# Patient Record
Sex: Male | Born: 1955 | Race: White | Hispanic: No | Marital: Single | Smoking: Former smoker
Health system: Southern US, Community
[De-identification: ages and names within clinical notes are randomized; demographics above are authoritative.]

## PROBLEM LIST (undated history)

## (undated) DIAGNOSIS — C61 Malignant neoplasm of prostate: Secondary | ICD-10-CM

## (undated) DIAGNOSIS — H7013 Chronic mastoiditis, bilateral: Secondary | ICD-10-CM

## (undated) DIAGNOSIS — J449 Chronic obstructive pulmonary disease, unspecified: Secondary | ICD-10-CM

## (undated) DIAGNOSIS — I10 Essential (primary) hypertension: Secondary | ICD-10-CM

## (undated) DIAGNOSIS — M4800 Spinal stenosis, site unspecified: Secondary | ICD-10-CM

## (undated) DIAGNOSIS — C3490 Malignant neoplasm of unspecified part of unspecified bronchus or lung: Secondary | ICD-10-CM

## (undated) DIAGNOSIS — J939 Pneumothorax, unspecified: Secondary | ICD-10-CM

## (undated) DIAGNOSIS — IMO0001 Reserved for inherently not codable concepts without codable children: Secondary | ICD-10-CM

## (undated) DIAGNOSIS — H9190 Unspecified hearing loss, unspecified ear: Secondary | ICD-10-CM

## (undated) HISTORY — PX: STAPEDES SURGERY: SHX789

## (undated) HISTORY — PX: PROSTATE BIOPSY: SHX241

## (undated) HISTORY — PX: LEG SURGERY: SHX1003

## (undated) HISTORY — PX: MOUTH SURGERY: SHX715

---

## 2005-11-09 HISTORY — PX: COLONOSCOPY: SHX174

## 2006-07-20 ENCOUNTER — Ambulatory Visit (HOSPITAL_COMMUNITY): Admission: EM | Admit: 2006-07-20 | Discharge: 2006-07-20 | Payer: Self-pay | Admitting: Emergency Medicine

## 2008-09-19 ENCOUNTER — Emergency Department (HOSPITAL_COMMUNITY): Admission: EM | Admit: 2008-09-19 | Discharge: 2008-09-19 | Payer: Self-pay | Admitting: Emergency Medicine

## 2009-08-02 ENCOUNTER — Encounter: Admission: RE | Admit: 2009-08-02 | Discharge: 2009-08-02 | Payer: Self-pay | Admitting: Otolaryngology

## 2009-08-12 ENCOUNTER — Encounter (INDEPENDENT_AMBULATORY_CARE_PROVIDER_SITE_OTHER): Payer: Self-pay | Admitting: Otolaryngology

## 2009-08-12 ENCOUNTER — Ambulatory Visit (HOSPITAL_BASED_OUTPATIENT_CLINIC_OR_DEPARTMENT_OTHER): Admission: RE | Admit: 2009-08-12 | Discharge: 2009-08-12 | Payer: Self-pay | Admitting: Otolaryngology

## 2009-12-30 ENCOUNTER — Ambulatory Visit (HOSPITAL_BASED_OUTPATIENT_CLINIC_OR_DEPARTMENT_OTHER): Admission: RE | Admit: 2009-12-30 | Discharge: 2009-12-30 | Payer: Self-pay | Admitting: Otolaryngology

## 2010-07-30 ENCOUNTER — Encounter: Admission: RE | Admit: 2010-07-30 | Discharge: 2010-08-28 | Payer: Self-pay | Admitting: Orthopedic Surgery

## 2011-01-28 LAB — COMPREHENSIVE METABOLIC PANEL
Alkaline Phosphatase: 73 U/L (ref 39–117)
BUN: 8 mg/dL (ref 6–23)
CO2: 28 mEq/L (ref 19–32)
Chloride: 104 mEq/L (ref 96–112)
Potassium: 3.5 mEq/L (ref 3.5–5.1)
Sodium: 138 mEq/L (ref 135–145)

## 2011-02-12 LAB — POCT HEMOGLOBIN-HEMACUE: Hemoglobin: 16.7 g/dL (ref 13.0–17.0)

## 2011-02-12 LAB — POCT I-STAT, CHEM 8
Calcium, Ion: 1.16 mmol/L (ref 1.12–1.32)
Glucose, Bld: 92 mg/dL (ref 70–99)
HCT: 46 % (ref 39.0–52.0)
Sodium: 141 mEq/L (ref 135–145)
TCO2: 30 mmol/L (ref 0–100)

## 2011-03-27 NOTE — Op Note (Signed)
NAME:  DENISE, BRAMBLETT NO.:  [id number]   MEDICAL RECORD NO.:  [id number]          PATIENT TYPE:  INP   LOCATION:  1832                         FACILITY:  [facility]   PHYSICIAN:  Burnard Bunting, M.D.    DATE OF BIRTH:  1956/04/29   DATE OF PROCEDURE:  07/20/2006  DATE OF DISCHARGE:                                 OPERATIVE REPORT   PREOPERATIVE DIAGNOSES:  Right leg complex laceration, with foreign bodies  and partial tendon laceration.   POSTOPERATIVE DIAGNOSES:  Right leg complex laceration, with foreign bodies  and partial tendon laceration.   PROCEDURES:  Right leg closure of complex laceration, with debridement of  tendon, skin, subcutaneous tissue, removal of foreign bodies.   SURGEON:  Burnard Bunting, M.D.   ASSISTANT:  None.   ANESTHESIA:  General endotracheal.   ESTIMATED BLOOD LOSS:  Minimal.   INDICATIONS:  Corday Wyka is a 55 year old patient, who had a piece of tile  cut his right leg.  He presents now for operative management.  The patient  was noted to have pulsatile bleeding in the ER on exam.   PROCEDURE IN DETAIL:  The patient was brought to the operating room, where  general endotracheal anesthesia was induced.  Preoperative antibiotics were  administered.  The right leg was prepped with Hibiclens and saline and  draped in a sterile manner.  The patient had about a 15-cm laceration on the  anterior compartment of his right leg.  His anterior tibial artery and  neurovascular bundle were intact.  There were foreign bodies within the  incision.  The anterior tib tendon was partially lacerated, and this was  debrided.  Using a curet, the skin, subcutaneous tissue, fascia and muscle  were debrided.  Foreign bodies  x3 were removed.  Copious irrigation was  then performed in a nonpulsatile fashion.  The incision was then  reapproximated loosely using a 3-0 nylon suture.  The patient was placed in  a bulky dressing.  He tolerated the procedure well  without immediate  complications.      Burnard Bunting, M.D.     GSD/MEDQ  D:  07/20/2006  T:  07/20/2006  Job:  [id number]

## 2011-03-27 NOTE — Consult Note (Signed)
NAME:  Kyle Hamilton, BELLANCA NO.:  [id number]   MEDICAL RECORD NO.:  [id number]          PATIENT TYPE:  INP   LOCATION:  2550                         FACILITY:  [facility]   PHYSICIAN:  Burnard Bunting, M.D.    DATE OF BIRTH:  08-29-1956   DATE OF CONSULTATION:  07/20/2006  DATE OF DISCHARGE:                                   CONSULTATION   CONSULT REQUESTED BY:  Dr. Weldon Inches.   CHIEF COMPLAINT:  Right leg pain.   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  He  reports significant bleeding which is not controllable in the ER.  He was  seen in urgent care and referred here.  He has not been able to bear weight  on the leg.  He denies any numbness or tingling in his foot.   PAST MEDICAL HISTORY:  Noncontributory.   PAST SURGICAL HISTORY:  None.   MEDICATIONS:  None.   ALLERGIES:  NONE.   REVIEW OF SYSTEMS:  Fourteen are reviewed and they are noncontributory.  Has  no history of DVT.  He has smoked a pack per day for 30 years.  He lives in  [place].  He has family in the area.  No recent chest pain or shortness  of breath.   PHYSICAL EXAMINATION:  VITAL SIGNS:  Blood pressure 135/100, heart rate 53,  respirations 12, 98% pulse ox on room air.  CHEST:  Clear to auscultation.  HEART:  Regular rate and rhythm.  ABDOMEN:  Benign.  EXTREMITIES:  The right lower extremity has DP/PT 2+/4.  Dorsalis and  plantar flexion is intact.  Sensation is intact on dorsoplantar aspect of  the foot.  Compartments are soft.  The range of motion is full.  He has a  longitudinal 15-cm laceration on the leg with arterial pulsatile bleeding  noted.  The laceration does get to the anterior compartment.   Radiographs are pending but by report negative.   IMPRESSION:  Complex right leg laceration with arterial bleeding.   PLAN:  I&D with delayed primary closure.  Risks and benefits were  discussed  with the patient.  He will likely be out of work approximately a week until  his return office visit.      Burnard Bunting, M.D.     GSD/MEDQ  D:  07/20/2006  T:  07/21/2006  Job:  [id number]

## 2011-08-11 LAB — URINALYSIS, ROUTINE W REFLEX MICROSCOPIC
Ketones, ur: 15 — AB
Nitrite: NEGATIVE
Specific Gravity, Urine: 1.031 — ABNORMAL HIGH
Urobilinogen, UA: 1
pH: 6.5

## 2011-08-11 LAB — POCT I-STAT, CHEM 8
Calcium, Ion: 1.05 — ABNORMAL LOW
Chloride: 99
Creatinine, Ser: 0.8
HCT: 50
Potassium: 3.1 — ABNORMAL LOW
Sodium: 138

## 2011-08-11 LAB — DIFFERENTIAL
Basophils Absolute: 0.1
Eosinophils Relative: 0
Lymphs Abs: 1
Monocytes Relative: 7
Neutro Abs: 12.6 — ABNORMAL HIGH

## 2011-08-11 LAB — CBC
HCT: 48.4
Hemoglobin: 16.3
Platelets: 259
RDW: 14.2
WBC: 14.6 — ABNORMAL HIGH

## 2011-08-11 LAB — HEPATIC FUNCTION PANEL
Albumin: 3.9
Alkaline Phosphatase: 80
Bilirubin, Direct: 0.2

## 2011-12-25 DIAGNOSIS — Z125 Encounter for screening for malignant neoplasm of prostate: Secondary | ICD-10-CM | POA: Diagnosis not present

## 2011-12-25 DIAGNOSIS — M5137 Other intervertebral disc degeneration, lumbosacral region: Secondary | ICD-10-CM | POA: Diagnosis not present

## 2011-12-25 DIAGNOSIS — G894 Chronic pain syndrome: Secondary | ICD-10-CM | POA: Diagnosis not present

## 2011-12-25 DIAGNOSIS — I10 Essential (primary) hypertension: Secondary | ICD-10-CM | POA: Diagnosis not present

## 2012-02-15 DIAGNOSIS — H7010 Chronic mastoiditis, unspecified ear: Secondary | ICD-10-CM | POA: Diagnosis not present

## 2012-02-23 DIAGNOSIS — F17200 Nicotine dependence, unspecified, uncomplicated: Secondary | ICD-10-CM | POA: Diagnosis not present

## 2012-02-23 DIAGNOSIS — J449 Chronic obstructive pulmonary disease, unspecified: Secondary | ICD-10-CM | POA: Diagnosis not present

## 2012-02-23 DIAGNOSIS — E785 Hyperlipidemia, unspecified: Secondary | ICD-10-CM | POA: Diagnosis not present

## 2012-02-23 DIAGNOSIS — I10 Essential (primary) hypertension: Secondary | ICD-10-CM | POA: Diagnosis not present

## 2012-05-31 DIAGNOSIS — H902 Conductive hearing loss, unspecified: Secondary | ICD-10-CM | POA: Diagnosis not present

## 2012-05-31 DIAGNOSIS — H7010 Chronic mastoiditis, unspecified ear: Secondary | ICD-10-CM | POA: Diagnosis not present

## 2012-07-21 NOTE — H&P (Signed)
  Assessment  CHRONIC MASTOIDITIS_ (383.1). 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 Orders  Audiological Evaluation; Comprehensive Audiometry; Requested for: 31 May 2012. Discussed  He has some vertigo recently which is slowly getting better and he feels that his hearing is getting worse. Both mastoid cavities looked healthy and dry with some dry cerumen buildup bilaterally. The left side was cleaned out. The right side was too tender to clean.   Audiogram reveals a severe mixed hearing loss.    Recommend he consider hearing amplification, which in his case, may best be provided with BAHA. We will have him return to discuss this in more detail with our audiologists. Reason For Visit  Dizziness and loss of hearing. Allergies  No Known Drug Allergies. Current Meds  Hydrochlorothiazide 25 MG Oral Tablet;; RPT ProAir HFA 108 (90 Base) MCG/ACT Inhalation Aerosol Solution;; RPT. 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 PSH  Ear Surgery. Personal Hx  Current Smoker (305.1) Marital History - Single Never Drank Alcohol. Signature  Electronically signed by : Serena Colonel  M.D.; 05/31/2012 11:20 PM EST.

## 2012-07-27 ENCOUNTER — Encounter (HOSPITAL_COMMUNITY): Payer: Self-pay | Admitting: Pharmacy Technician

## 2012-07-28 ENCOUNTER — Encounter (HOSPITAL_COMMUNITY): Payer: Self-pay

## 2012-07-28 ENCOUNTER — Encounter (HOSPITAL_COMMUNITY)
Admission: RE | Admit: 2012-07-28 | Discharge: 2012-07-28 | Disposition: A | Discharge status: Home / Self Care | Payer: Medicare Other | Source: Ambulatory Visit | Attending: Otolaryngology | Admitting: Otolaryngology

## 2012-07-28 DIAGNOSIS — F17200 Nicotine dependence, unspecified, uncomplicated: Secondary | ICD-10-CM | POA: Diagnosis not present

## 2012-07-28 DIAGNOSIS — Z01811 Encounter for preprocedural respiratory examination: Secondary | ICD-10-CM | POA: Diagnosis not present

## 2012-07-28 DIAGNOSIS — I10 Essential (primary) hypertension: Secondary | ICD-10-CM | POA: Diagnosis not present

## 2012-07-28 DIAGNOSIS — J438 Other emphysema: Secondary | ICD-10-CM | POA: Diagnosis not present

## 2012-07-28 DIAGNOSIS — H900 Conductive hearing loss, bilateral: Secondary | ICD-10-CM | POA: Diagnosis not present

## 2012-07-28 DIAGNOSIS — Z01818 Encounter for other preprocedural examination: Secondary | ICD-10-CM | POA: Diagnosis not present

## 2012-07-28 DIAGNOSIS — Z01810 Encounter for preprocedural cardiovascular examination: Secondary | ICD-10-CM | POA: Diagnosis not present

## 2012-07-28 DIAGNOSIS — Z01812 Encounter for preprocedural laboratory examination: Secondary | ICD-10-CM | POA: Diagnosis not present

## 2012-07-28 HISTORY — DX: Essential (primary) hypertension: I10

## 2012-07-28 HISTORY — DX: Unspecified hearing loss, unspecified ear: H91.90

## 2012-07-28 LAB — CBC
HCT: 46.8 % (ref 39.0–52.0)
Hemoglobin: 15.7 g/dL (ref 13.0–17.0)
MCH: 29.8 pg (ref 26.0–34.0)
MCHC: 33.5 g/dL (ref 30.0–36.0)
MCV: 88.8 fL (ref 78.0–100.0)

## 2012-07-28 LAB — BASIC METABOLIC PANEL
BUN: 8 mg/dL (ref 6–23)
CO2: 31 mEq/L (ref 19–32)
GFR calc non Af Amer: >90 mL/min (ref >90)
Glucose, Bld: 95 mg/dL (ref 70–99)
Potassium: 3.4 mEq/L — ABNORMAL LOW (ref 3.5–5.1)

## 2012-07-28 LAB — SURGICAL PCR SCREEN
MRSA, PCR: NEGATIVE
Staphylococcus aureus: NEGATIVE

## 2012-07-28 NOTE — Pre-Procedure Instructions (Signed)
20 Kyle Hamilton  07/28/2012   Your procedure is scheduled on: 08-03-2012  Report to [facility] at 10:00 AM.  Call this number if you have problems the morning of surgery: [phone]   Remember:   Do not eat food or drink:After Midnight  Tuesday.      Take these medicines the morning of surgery with A SIP OF WATER: none   Do not wear jewelry  Do not wear lotions, powders, or perfumes. You may wear deodorant.  Do not shave 48 hours prior to surgery. Men may shave face and neck.  Do not bring valuables to the hospital.  Contacts, dentures or bridgework may not be worn into surgery.  Leave suitcase in the car. After surgery it may be brought to your room.  For patients admitted to the hospital, checkout time is 11:00 AM the day of discharge.   Patients discharged the day of surgery will not be allowed to drive home.    Name and phone number of your driver: __________________    Special Instructions: CHG Shower Shower 2 days before surgery and 1 day before surgery with Hibiclens.   Please read over the following fact sheets that you were given: Pain Booklet, MRSA Information and Surgical Site Infection Prevention

## 2012-08-01 DIAGNOSIS — H906 Mixed conductive and sensorineural hearing loss, bilateral: Secondary | ICD-10-CM | POA: Diagnosis not present

## 2012-08-02 MED ORDER — CEFAZOLIN SODIUM-DEXTROSE 2-3 GM-% IV SOLR
2.0000 g | INTRAVENOUS | Status: AC
  Administered 2012-08-03: 2 g via INTRAVENOUS
  Filled 2012-08-02: qty 50

## 2012-08-03 ENCOUNTER — Encounter (HOSPITAL_COMMUNITY): Payer: Self-pay | Admitting: Critical Care Medicine

## 2012-08-03 ENCOUNTER — Ambulatory Visit (HOSPITAL_COMMUNITY): Payer: Medicare Other | Admitting: Critical Care Medicine

## 2012-08-03 ENCOUNTER — Encounter (HOSPITAL_COMMUNITY): Payer: Self-pay | Admitting: *Deleted

## 2012-08-03 ENCOUNTER — Encounter (HOSPITAL_COMMUNITY)
Admission: RE | Disposition: A | Discharge status: Home / Self Care | Payer: Self-pay | Source: Ambulatory Visit | Attending: Otolaryngology

## 2012-08-03 ENCOUNTER — Ambulatory Visit (HOSPITAL_COMMUNITY)
Admission: RE | Admit: 2012-08-03 | Discharge: 2012-08-03 | Disposition: A | Discharge status: Home / Self Care | Payer: Medicare Other | Source: Ambulatory Visit | Attending: Otolaryngology | Admitting: Otolaryngology

## 2012-08-03 DIAGNOSIS — H900 Conductive hearing loss, bilateral: Secondary | ICD-10-CM | POA: Diagnosis not present

## 2012-08-03 DIAGNOSIS — Z01818 Encounter for other preprocedural examination: Secondary | ICD-10-CM | POA: Insufficient documentation

## 2012-08-03 DIAGNOSIS — F17200 Nicotine dependence, unspecified, uncomplicated: Secondary | ICD-10-CM | POA: Insufficient documentation

## 2012-08-03 DIAGNOSIS — Z01810 Encounter for preprocedural cardiovascular examination: Secondary | ICD-10-CM | POA: Insufficient documentation

## 2012-08-03 DIAGNOSIS — Z01812 Encounter for preprocedural laboratory examination: Secondary | ICD-10-CM | POA: Insufficient documentation

## 2012-08-03 DIAGNOSIS — I10 Essential (primary) hypertension: Secondary | ICD-10-CM | POA: Diagnosis not present

## 2012-08-03 DIAGNOSIS — H902 Conductive hearing loss, unspecified: Secondary | ICD-10-CM | POA: Diagnosis not present

## 2012-08-03 HISTORY — PX: BONE ANCHORED HEARING AID IMPLANT: SHX5193

## 2012-08-03 SURGERY — INSERTION, BONE ANCHORED HEARING AID
Anesthesia: General | Site: Ear | Laterality: Right | Wound class: Clean

## 2012-08-03 MED ORDER — EPHEDRINE SULFATE 50 MG/ML IJ SOLN
INTRAMUSCULAR | Status: DC | PRN
  Administered 2012-08-03: 10 mg via INTRAVENOUS
  Administered 2012-08-03: 5 mg via INTRAVENOUS

## 2012-08-03 MED ORDER — 0.9 % SODIUM CHLORIDE (POUR BTL) OPTIME
TOPICAL | Status: DC | PRN
  Administered 2012-08-03: 1000 mL

## 2012-08-03 MED ORDER — ROCURONIUM BROMIDE 100 MG/10ML IV SOLN
INTRAVENOUS | Status: DC | PRN
  Administered 2012-08-03: 50 mg via INTRAVENOUS

## 2012-08-03 MED ORDER — MIDAZOLAM HCL 5 MG/5ML IJ SOLN
INTRAMUSCULAR | Status: DC | PRN
  Administered 2012-08-03: 2 mg via INTRAVENOUS

## 2012-08-03 MED ORDER — LACTATED RINGERS IV SOLN
INTRAVENOUS | Status: DC
  Administered 2012-08-03: 11:00:00 via INTRAVENOUS

## 2012-08-03 MED ORDER — ONDANSETRON HCL 4 MG/2ML IJ SOLN
INTRAMUSCULAR | Status: DC | PRN
  Administered 2012-08-03: 4 mg via INTRAVENOUS

## 2012-08-03 MED ORDER — PROPOFOL 10 MG/ML IV BOLUS
INTRAVENOUS | Status: DC | PRN
  Administered 2012-08-03: 100 mg via INTRAVENOUS

## 2012-08-03 MED ORDER — LIDOCAINE HCL (CARDIAC) 20 MG/ML IV SOLN
INTRAVENOUS | Status: DC | PRN
  Administered 2012-08-03: 100 mg via INTRAVENOUS

## 2012-08-03 MED ORDER — LIDOCAINE-EPINEPHRINE 1 %-1:100000 IJ SOLN
INTRAMUSCULAR | Status: AC
  Filled 2012-08-03: qty 1

## 2012-08-03 MED ORDER — LIDOCAINE-EPINEPHRINE 1 %-1:100000 IJ SOLN
INTRAMUSCULAR | Status: DC | PRN
  Administered 2012-08-03: 1 mL

## 2012-08-03 MED ORDER — ARTIFICIAL TEARS OP OINT
TOPICAL_OINTMENT | OPHTHALMIC | Status: DC | PRN
  Administered 2012-08-03: 1 via OPHTHALMIC

## 2012-08-03 MED ORDER — LACTATED RINGERS IV SOLN
INTRAVENOUS | Status: DC | PRN
  Administered 2012-08-03 (×2): via INTRAVENOUS

## 2012-08-03 MED ORDER — PROMETHAZINE HCL 25 MG RE SUPP: 25.0000 mg | Freq: Four times a day (QID) | RECTAL | Status: DC | PRN

## 2012-08-03 MED ORDER — HYDROCODONE-ACETAMINOPHEN 7.5-500 MG PO TABS: 1.0000 | ORAL_TABLET | Freq: Four times a day (QID) | ORAL | Status: DC | PRN

## 2012-08-03 MED ORDER — BACITRACIN ZINC 500 UNIT/GM EX OINT
TOPICAL_OINTMENT | CUTANEOUS | Status: AC
  Filled 2012-08-03: qty 15

## 2012-08-03 MED ORDER — HYDROMORPHONE HCL PF 1 MG/ML IJ SOLN: 0.2500 mg | INTRAMUSCULAR | Status: DC | PRN

## 2012-08-03 MED ORDER — MINERAL OIL LIGHT 100 % EX OIL
TOPICAL_OIL | CUTANEOUS | Status: AC
  Filled 2012-08-03: qty 25

## 2012-08-03 MED ORDER — GLYCOPYRROLATE 0.2 MG/ML IJ SOLN
INTRAMUSCULAR | Status: DC | PRN
  Administered 2012-08-03: 0.6 mg via INTRAVENOUS
  Administered 2012-08-03: 0.1 mg via INTRAVENOUS

## 2012-08-03 MED ORDER — CEPHALEXIN 500 MG PO CAPS: 500.0000 mg | ORAL_CAPSULE | Freq: Three times a day (TID) | ORAL | Status: DC

## 2012-08-03 MED ORDER — DEXAMETHASONE SODIUM PHOSPHATE 4 MG/ML IJ SOLN
INTRAMUSCULAR | Status: DC | PRN
  Administered 2012-08-03: 4 mg via INTRAVENOUS

## 2012-08-03 MED ORDER — NEOSTIGMINE METHYLSULFATE 1 MG/ML IJ SOLN
INTRAMUSCULAR | Status: DC | PRN
  Administered 2012-08-03: 4 mg via INTRAVENOUS

## 2012-08-03 MED ORDER — FENTANYL CITRATE 0.05 MG/ML IJ SOLN
INTRAMUSCULAR | Status: DC | PRN
  Administered 2012-08-03: 150 ug via INTRAVENOUS

## 2012-08-03 SURGICAL SUPPLY — 41 items
ALLEVYN NON-ADHESIVE DRESSING ×1 IMPLANT
BIA400 Implant 4mm (Otologic Implant) ×1 IMPLANT
BIOPSY PUNCH ×1 IMPLANT
BLADE SURG 15 STRL LF DISP TIS (BLADE) ×1 IMPLANT
BLADE SURG 15 STRL SS (BLADE) ×2
BLADE SURG ROTATE 9660 (MISCELLANEOUS) ×2 IMPLANT
CANISTER SUCTION 2500CC (MISCELLANEOUS) ×2 IMPLANT
CLEANER TIP ELECTROSURG 2X2 (MISCELLANEOUS) ×2 IMPLANT
CLOTH BEACON ORANGE TIMEOUT ST (SAFETY) ×2 IMPLANT
CORDS BIPOLAR (ELECTRODE) ×2 IMPLANT
COVER SURGICAL LIGHT HANDLE (MISCELLANEOUS) ×2 IMPLANT
CRADLE DONUT ADULT HEAD (MISCELLANEOUS) ×1 IMPLANT
DECANTER SPIKE VIAL GLASS SM (MISCELLANEOUS) ×2 IMPLANT
DRAPE INCISE 23X17 IOBAN STRL (DRAPES) ×1
DRAPE INCISE 23X17 STRL (DRAPES) ×1 IMPLANT
DRAPE INCISE IOBAN 23X17 STRL (DRAPES) ×1 IMPLANT
DRAPE MICROSCOPE LEICA 46X105 (MISCELLANEOUS) IMPLANT
DRAPE MICROSCOPE LEICA 54X105 (DRAPE) ×1 IMPLANT
ELECT COATED BLADE 2.86 ST (ELECTRODE) ×2 IMPLANT
ELECT REM PT RETURN 9FT ADLT (ELECTROSURGICAL) ×2
ELECTRODE REM PT RTRN 9FT ADLT (ELECTROSURGICAL) ×1 IMPLANT
GAUZE SPONGE 4X4 16PLY XRAY LF (GAUZE/BANDAGES/DRESSINGS) ×8 IMPLANT
GLOVE BIO SURGEON STRL SZ7.5 (GLOVE) ×3 IMPLANT
GOWN STRL NON-REIN LRG LVL3 (GOWN DISPOSABLE) ×5 IMPLANT
GUIDE DRILL 3+4MM ×1 IMPLANT
HEALING CAP WITH PLUG 30MM ×1 IMPLANT
KIT BASIN OR (CUSTOM PROCEDURE TRAY) ×2 IMPLANT
KIT ROOM TURNOVER OR (KITS) ×2 IMPLANT
NS IRRIG 1000ML POUR BTL (IV SOLUTION) ×2 IMPLANT
PAD ARMBOARD 7.5X6 YLW CONV (MISCELLANEOUS) ×3 IMPLANT
PENCIL BUTTON HOLSTER BLD 10FT (ELECTRODE) ×1 IMPLANT
SUT ETHILON 4 0 PS 2 18 (SUTURE) ×4 IMPLANT
SUT VIC AB 4-0 P-3 18X BRD (SUTURE) ×3 IMPLANT
SUT VIC AB 4-0 P3 18 (SUTURE) ×2
SYR BULB 3OZ (MISCELLANEOUS) ×2 IMPLANT
TOWEL OR 17X24 6PK STRL BLUE (TOWEL DISPOSABLE) ×2 IMPLANT
TOWEL OR 17X26 10 PK STRL BLUE (TOWEL DISPOSABLE) ×2 IMPLANT
TRAY ENT MC OR (CUSTOM PROCEDURE TRAY) ×2 IMPLANT
WATER STERILE IRR 1000ML POUR (IV SOLUTION) ×1 IMPLANT
WIDENING DRILL 4MM ×1 IMPLANT
WIPE INSTRUMENT VISIWIPE 73X73 (MISCELLANEOUS) ×2 IMPLANT

## 2012-08-03 NOTE — Op Note (Signed)
OPERATIVE REPORT  DATE OF SURGERY: 08/03/2012  PATIENT:  Kyle Hamilton,  56 y.o. male  PRE-OPERATIVE DIAGNOSIS:  Conductive hearing loss   POST-OPERATIVE DIAGNOSIS:  Conductive hearing loss   PROCEDURE:  Procedure(s): BONE ANCHORED HEARING AID (BAHA) IMPLANT  SURGEON:  Susy Frizzle, MD  ASSISTANTS: none   ANESTHESIA:   general  EBL:  5 ml  DRAINS: none   LOCAL MEDICATIONS USED:  OTHER XYLOCAINE WITH Epinephrine  SPECIMEN:  No Specimen  COUNTS:  YES  PROCEDURE DETAILS: Patient was taken to the operating room and placed on the operating table in the supine position. Following induction of general endotracheal anesthesia, the hairline was shaved on the right side, posterior to the pinna. The implant site was marked with a marking pen, an incision was outlined superior and inferior to this. The skin was then prepped and draped in a standard fashion. Xylocaine with epinephrine was infiltrated all the way down to the skull. A 5 mm dermal punch was used to create a circular opening in the proposed implant site all the way down to the periosteum. Incision was then elongated superiorly and inferiorly with 15 scalpel. Exposure of the skull was obtained. A 15 scalpel was used to incise the periosteum and a cruciate fashion. Periosteal elevator was used to expose the bone. The cochlear Baha system was used. Prior to the marking and injection, a needle was used to measure the depth of soft tissue at 6 mm. A 10 mm implant was then used. The pilot hole was created. The hole widening was then performed. When the original abutment was placed, unfortunately it did not lock into place. A new site 1 cm superior was then used and the periosteum was incised and the skull was exposed. A pilot hole was created, followed by the widening hole . The implant was placed without difficulty and was locked and secured in place with good seating. The wound was irrigated with saline. 3-0 Vicryl suture was used to  reapproximate the skin and soft tissue. A dressing was applied with bacitracin ointment and the healing cap was applied. The patient was then awakened extubated and transferred to PACU in stable condition.  PATIENT DISPOSITION:  PACU - hemodynamically stable.

## 2012-08-03 NOTE — Anesthesia Procedure Notes (Signed)
Procedure Name: Intubation Date/Time: 08/03/2012 12:11 PM Performed by: Elon Alas Pre-anesthesia Checklist: Patient identified, Timeout performed, Emergency Drugs available, Suction available and Patient being monitored Patient Re-evaluated:Patient Re-evaluated prior to inductionOxygen Delivery Method: Circle system utilized Preoxygenation: Pre-oxygenation with 100% oxygen Intubation Type: IV induction Ventilation: Mask ventilation without difficulty and Oral airway inserted - appropriate to patient size Laryngoscope Size: Mac and 4 Grade View: Grade I Tube type: Oral Tube size: 7.5 mm Number of attempts: 1 Airway Equipment and Method: Stylet Placement Confirmation: positive ETCO2,  ETT inserted through vocal cords under direct vision and breath sounds checked- equal and bilateral Secured at: 23 cm Tube secured with: Tape Dental Injury: Teeth and Oropharynx as per pre-operative assessment  Comments: DLx1 by EMT student

## 2012-08-03 NOTE — Preoperative (Signed)
Beta Blockers   Reason not to administer Beta Blockers:Not Applicable 

## 2012-08-03 NOTE — Anesthesia Preprocedure Evaluation (Addendum)
Anesthesia Evaluation  Patient identified by MRN, date of birth, ID band Patient awake    Reviewed: Allergy & Precautions, H&P , NPO status , Patient's Chart, lab work & pertinent test results  Airway Mallampati: II TM Distance: >3 FB Neck ROM: Full    Dental  (+) Dental Advisory Given, Lower Dentures and Upper Dentures   Pulmonary Current Smoker,  breath sounds clear to auscultation        Cardiovascular hypertension, Pt. on medications Rhythm:Regular Rate:Normal     Neuro/Psych    GI/Hepatic negative GI ROS, Neg liver ROS,   Endo/Other  negative endocrine ROS  Renal/GU negative Renal ROS     Musculoskeletal   Abdominal   Peds  Hematology negative hematology ROS (+)   Anesthesia Other Findings   Reproductive/Obstetrics                         Anesthesia Physical Anesthesia Plan  ASA: II  Anesthesia Plan: General   Post-op Pain Management:    Induction: Intravenous  Airway Management Planned: Oral ETT  Additional Equipment:   Intra-op Plan:   Post-operative Plan: Extubation in OR  Informed Consent: I have reviewed the patients History and Physical, chart, labs and discussed the procedure including the risks, benefits and alternatives for the proposed anesthesia with the patient or authorized representative who has indicated his/her understanding and acceptance.   Dental advisory given  Plan Discussed with: Anesthesiologist, Surgeon and CRNA  Anesthesia Plan Comments:        Anesthesia Quick Evaluation

## 2012-08-03 NOTE — Transfer of Care (Signed)
Immediate Anesthesia Transfer of Care Note  Patient: Kyle Hamilton  Procedure(s) Performed: Procedure(s) (LRB) with comments: BONE ANCHORED HEARING AID (BAHA) IMPLANT (Right)  Patient Location: PACU  Anesthesia Type: General  Level of Consciousness: awake, alert  and oriented  Airway & Oxygen Therapy: Patient Spontanous Breathing and Patient connected to nasal cannula oxygen  Post-op Assessment: Report given to PACU RN, Post -op Vital signs reviewed and stable and Patient moving all extremities X 4  Post vital signs: Reviewed and stable  Complications: No apparent anesthesia complications

## 2012-08-03 NOTE — Transfer of Care (Signed)
Immediate Anesthesia Transfer of Care Note  Patient: Kyle Hamilton  Procedure(s) Performed: Procedure(s) (LRB) with comments: BONE ANCHORED HEARING AID (BAHA) IMPLANT (Right)  Patient Location: PACU  Anesthesia Type: General  Level of Consciousness: awake  Airway & Oxygen Therapy: Patient Spontanous Breathing  Post-op Assessment: Report given to PACU RN  Post vital signs: Reviewed  Complications: No apparent anesthesia complications

## 2012-08-03 NOTE — Anesthesia Postprocedure Evaluation (Signed)
  Anesthesia Post-op Note  Patient: Kyle Hamilton  Procedure(s) Performed: Procedure(s) (LRB) with comments: BONE ANCHORED HEARING AID (BAHA) IMPLANT (Right)  Patient Location: PACU  Anesthesia Type: General  Level of Consciousness: awake  Airway and Oxygen Therapy: Patient Spontanous Breathing  Post-op Pain: mild  Post-op Assessment: Post-op Vital signs reviewed  Post-op Vital Signs: Reviewed  Complications: No apparent anesthesia complications

## 2012-08-03 NOTE — Interval H&P Note (Signed)
History and Physical Interval Note:  08/03/2012 11:47 AM  Kyle Hamilton  has presented today for surgery, with the diagnosis of conductive hearing loss   The various methods of treatment have been discussed with the patient and family. After consideration of risks, benefits and other options for treatment, the patient has consented to  Procedure(s) (LRB) with comments: BONE ANCHORED HEARING AID (BAHA) IMPLANT (Right) as a surgical intervention .  The patient's history has been reviewed, patient examined, no change in status, stable for surgery.  I have reviewed the patient's chart and labs.  Questions were answered to the patient's satisfaction.     Kyle Hamilton

## 2012-08-08 ENCOUNTER — Encounter (HOSPITAL_COMMUNITY): Payer: Self-pay | Admitting: Otolaryngology

## 2013-06-12 DIAGNOSIS — H906 Mixed conductive and sensorineural hearing loss, bilateral: Secondary | ICD-10-CM | POA: Diagnosis not present

## 2014-01-19 ENCOUNTER — Emergency Department (INDEPENDENT_AMBULATORY_CARE_PROVIDER_SITE_OTHER)
Admission: EM | Admit: 2014-01-19 | Discharge: 2014-01-19 | Disposition: A | Discharge status: Home / Self Care | Payer: Medicare Other | Source: Home / Self Care | Attending: Family Medicine | Admitting: Family Medicine

## 2014-01-19 ENCOUNTER — Encounter (HOSPITAL_COMMUNITY): Payer: Self-pay | Admitting: Emergency Medicine

## 2014-01-19 DIAGNOSIS — I10 Essential (primary) hypertension: Secondary | ICD-10-CM

## 2014-01-19 LAB — POCT I-STAT, CHEM 8
BUN: 9 mg/dL (ref 6–23)
CREATININE: 0.7 mg/dL (ref 0.50–1.35)
Calcium, Ion: 1.19 mmol/L (ref 1.12–1.23)
Chloride: 103 mEq/L (ref 96–112)
Glucose, Bld: 80 mg/dL (ref 70–99)
HEMATOCRIT: 45 % (ref 39.0–52.0)
HEMOGLOBIN: 15.3 g/dL (ref 13.0–17.0)
POTASSIUM: 3.8 meq/L (ref 3.7–5.3)
SODIUM: 143 meq/L (ref 137–147)
TCO2: 26 mmol/L (ref 0–100)

## 2014-01-19 MED ORDER — HYDROCHLOROTHIAZIDE 25 MG PO TABS: 25.0000 mg | ORAL_TABLET | Freq: Every day | ORAL | Status: DC

## 2014-01-19 NOTE — ED Provider Notes (Signed)
CSN: [id number]     Arrival date & time 01/19/14  1122 History   None    Chief Complaint  Patient presents with  . Medication Refill   (Consider location/radiation/quality/duration/timing/severity/associated sxs/prior Treatment) HPI Comments: 58 year old male presents requesting a refill of his hydrochlorothiazide. He is in the process of changing primary care doctors and he has not been able to get his refill. He has been out of his medication for one week now. He denies any symptoms at this time.   Past Medical History  Diagnosis Date  . Hypertension   . Hearing loss    Past Surgical History  Procedure Laterality Date  . Leg surgery      right leg deep cut  . Stapedes surgery      bilateral  . Bone anchored hearing aid implant  08/03/2012    Procedure: BONE ANCHORED HEARING AID (BAHA) IMPLANT;  Surgeon: Izora Gala, MD;  Location: [facility];  Service: ENT;  Laterality: Right;   History reviewed. No pertinent family history. History  Substance Use Topics  . Smoking status: Current Every Day Smoker -- 1.00 packs/day for 35 years  . Smokeless tobacco: Not on file  . Alcohol Use: No    Review of Systems  Constitutional: Negative for fever, chills and fatigue.  HENT: Negative for sore throat.   Eyes: Negative for visual disturbance.  Respiratory: Negative for cough and shortness of breath.   Cardiovascular: Negative for chest pain, palpitations and leg swelling.  Gastrointestinal: Negative for nausea, vomiting, abdominal pain, diarrhea and constipation.  Genitourinary: Negative for dysuria, urgency, frequency, hematuria and decreased urine volume.  Musculoskeletal: Negative for arthralgias, myalgias, neck pain and neck stiffness.  Skin: Negative for rash.  Neurological: Negative for dizziness, weakness and light-headedness.    Allergies  Review of patient's allergies indicates no known allergies.  Home Medications   Current Outpatient Rx  Name  Route  Sig  Dispense  Refill   . cephALEXin (KEFLEX) 500 MG capsule   Oral   Take 1 capsule (500 mg total) by mouth 3 (three) times daily.   15 capsule   0   . hydrochlorothiazide (HYDRODIURIL) 25 MG tablet   Oral   Take 1 tablet (25 mg total) by mouth daily.   30 tablet   2   . HYDROcodone-acetaminophen (LORTAB 7.5) 7.5-500 MG per tablet   Oral   Take 1 tablet by mouth every 6 (six) hours as needed for pain.   30 tablet   0   . promethazine (PHENERGAN) 25 MG suppository   Rectal   Place 1 suppository (25 mg total) rectally every 6 (six) hours as needed for nausea.   12 each   0    BP 165/89  Pulse 53  Temp(Src) 97.5 F (36.4 C)  Resp 14  SpO2 98% Physical Exam  Nursing note and vitals reviewed. Constitutional: He is oriented to person, place, and time. He appears well-developed and well-nourished. No distress.  HENT:  Head: Normocephalic.  Cardiovascular: Normal rate, regular rhythm and normal heart sounds.  Exam reveals no gallop and no friction rub.   No murmur heard. Pulmonary/Chest: Effort normal and breath sounds normal. No respiratory distress. He has no wheezes. He has no rales.  Neurological: He is alert and oriented to person, place, and time. Coordination normal.  Skin: Skin is warm and dry. No rash noted. He is not diaphoretic.  Psychiatric: He has a normal mood and affect. Judgment normal.    ED Course  Procedures (  including critical care time) Labs Review Labs Reviewed  POCT I-STAT, CHEM 8   Imaging Review No results found.   MDM   1. Hypertension    Istat normal.  HCTZ refilled.  F/U PCP   Meds ordered this encounter  Medications  . hydrochlorothiazide (HYDRODIURIL) 25 MG tablet    Sig: Take 1 tablet (25 mg total) by mouth daily.    Dispense:  30 tablet    Refill:  2    Order Specific Question:  Supervising Provider    Answer:  Lynne Leader, [place]       Liam Graham, PA-C 01/19/14 1342

## 2014-01-19 NOTE — Discharge Instructions (Signed)

## 2014-01-19 NOTE — ED Notes (Signed)
Out of BP medication for 1 week; cannot get in to see his regular provider

## 2014-01-20 NOTE — ED Provider Notes (Signed)
Medical screening examination/treatment/procedure(s) were performed by resident physician or non-physician practitioner and as supervising physician I was immediately available for consultation/collaboration.   Pauline Good MD.   Billy Fischer, MD 01/20/14 [phone]

## 2014-04-09 DIAGNOSIS — M549 Dorsalgia, unspecified: Secondary | ICD-10-CM | POA: Diagnosis not present

## 2014-05-11 DIAGNOSIS — J441 Chronic obstructive pulmonary disease with (acute) exacerbation: Secondary | ICD-10-CM | POA: Diagnosis not present

## 2014-07-12 DIAGNOSIS — Z0000 Encounter for general adult medical examination without abnormal findings: Secondary | ICD-10-CM | POA: Diagnosis not present

## 2014-07-12 DIAGNOSIS — Z125 Encounter for screening for malignant neoplasm of prostate: Secondary | ICD-10-CM | POA: Diagnosis not present

## 2014-07-12 DIAGNOSIS — I10 Essential (primary) hypertension: Secondary | ICD-10-CM | POA: Diagnosis not present

## 2014-07-12 DIAGNOSIS — Z113 Encounter for screening for infections with a predominantly sexual mode of transmission: Secondary | ICD-10-CM | POA: Diagnosis not present

## 2014-07-27 DIAGNOSIS — H902 Conductive hearing loss, unspecified: Secondary | ICD-10-CM | POA: Diagnosis not present

## 2014-08-24 NOTE — H&P (Signed)
  Assessment  CHL (conductive hearing loss) (389.00) (H90.2). Discussed  The abutment completely fell out this morning. On exam, there is no infection. We will let that side heal and we'll plan to replace it on the left side in the near future. Reason For Visit  Implant dislodged . Allergies  No Known Drug Allergies. Current Meds  Hydrochlorothiazide 25 MG Oral Tablet;; RPT ProAir HFA 108 (90 Base) MCG/ACT Inhalation Aerosol Solution;; RPT. 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 [facility]  Ear Surgery [facility] Ear Surgery Cochlear Device Implantation [phone]; BAHA. Family Hx  No pertinent family history: Mother,Father. Personal Hx  Current smoker (305.1) (Z72.0) Marital History - Single Never Drank Alcohol. Vital Signs   Recorded by Rogers,Lisa on 27 Jul 2014 01:19 PM BP:113/83,  Height: 5 ft 5 in, Weight: 130 lb , BMI: 21.6 kg/m2,  BMI Calculated: 21.63 ,  BSA Calculated: 1.65. Signature  Electronically signed by : Izora Gala  M.D.; 07/27/2014 1:23 PM EST.

## 2014-08-28 ENCOUNTER — Encounter (HOSPITAL_COMMUNITY)
Admission: RE | Admit: 2014-08-28 | Discharge: 2014-08-28 | Disposition: A | Discharge status: Home / Self Care | Payer: Medicare Other | Source: Ambulatory Visit | Attending: Otolaryngology | Admitting: Otolaryngology

## 2014-08-28 ENCOUNTER — Encounter (HOSPITAL_COMMUNITY): Payer: Self-pay | Admitting: Pharmacy Technician

## 2014-08-28 ENCOUNTER — Ambulatory Visit (HOSPITAL_COMMUNITY)
Admission: RE | Admit: 2014-08-28 | Discharge: 2014-08-28 | Disposition: A | Discharge status: Home / Self Care | Payer: Medicare Other | Source: Ambulatory Visit | Attending: Anesthesiology | Admitting: Anesthesiology

## 2014-08-28 ENCOUNTER — Encounter (HOSPITAL_COMMUNITY): Payer: Self-pay

## 2014-08-28 DIAGNOSIS — R911 Solitary pulmonary nodule: Secondary | ICD-10-CM | POA: Diagnosis not present

## 2014-08-28 DIAGNOSIS — R599 Enlarged lymph nodes, unspecified: Secondary | ICD-10-CM | POA: Diagnosis not present

## 2014-08-28 DIAGNOSIS — Z01818 Encounter for other preprocedural examination: Secondary | ICD-10-CM | POA: Insufficient documentation

## 2014-08-28 DIAGNOSIS — Z87891 Personal history of nicotine dependence: Secondary | ICD-10-CM | POA: Diagnosis not present

## 2014-08-28 DIAGNOSIS — Z01811 Encounter for preprocedural respiratory examination: Secondary | ICD-10-CM

## 2014-08-28 HISTORY — DX: Spinal stenosis, site unspecified: M48.00

## 2014-08-28 HISTORY — DX: Chronic obstructive pulmonary disease, unspecified: J44.9

## 2014-08-28 LAB — CBC
HCT: 45.8 % (ref 39.0–52.0)
HEMOGLOBIN: 15.3 g/dL (ref 13.0–17.0)
MCH: 29.9 pg (ref 26.0–34.0)
MCHC: 33.4 g/dL (ref 30.0–36.0)
MCV: 89.5 fL (ref 78.0–100.0)
Platelets: 296 10*3/uL (ref 150–400)
RBC: 5.12 MIL/uL (ref 4.22–5.81)
RDW: 13.8 % (ref 11.5–15.5)
WBC: 7.3 10*3/uL (ref 4.0–10.5)

## 2014-08-28 LAB — BASIC METABOLIC PANEL
ANION GAP: 9 (ref 5–15)
BUN: 11 mg/dL (ref 6–23)
CHLORIDE: 102 meq/L (ref 96–112)
CO2: 31 mEq/L (ref 19–32)
Calcium: 9.4 mg/dL (ref 8.4–10.5)
Creatinine, Ser: 0.93 mg/dL (ref 0.50–1.35)
GFR calc non Af Amer: >90 mL/min (ref >90)
Glucose, Bld: 84 mg/dL (ref 70–99)
POTASSIUM: 3.8 meq/L (ref 3.7–5.3)
SODIUM: 142 meq/L (ref 137–147)

## 2014-08-28 NOTE — Pre-Procedure Instructions (Signed)
Kyle Hamilton  08/28/2014   Your procedure is scheduled on:  Thursday, October 22  Report to [facility] Admitting at 0530 AM.  Call this number if you have problems the morning of surgery: [phone]   Remember:   Do not eat food or drink liquids after midnight.Wednesday night   Take these medicines the morning of surgery with A SIP OF WATER: none   Do not wear jewelry.  Do not wear lotions, powders, or perfumes. You may wear deodorant.  Do not shave 48 hours prior to surgery. Men may shave face and neck.  Do not bring valuables to the hospital.  [facility] is not responsible   for any belongings or valuables.               Contacts, dentures or bridgework may not be worn into surgery.                     Patients discharged the day of surgery will not be allowed to drive  home.  Name and phone number of your driver:      Special Instructions: [name] - Preparing for Surgery  Before surgery, you can play an important role.  Because skin is not sterile, your skin needs to be as free of germs as possible.  You can reduce the number of germs on you skin by washing with CHG (chlorahexidine gluconate) soap before surgery.  CHG is an antiseptic cleaner which kills germs and bonds with the skin to continue killing germs even after washing.  Please DO NOT use if you have an allergy to CHG or antibacterial soaps.  If your skin becomes reddened/irritated stop using the CHG and inform your nurse when you arrive at Short Stay.  Do not shave (including legs and underarms) for at least 48 hours prior to the first CHG shower.  You may shave your face.  Please follow these instructions carefully:   1.  Shower with CHG Soap the night before surgery and the  morning of Surgery.  2.  If you choose to wash your hair, wash your hair first as usual with your  normal shampoo.  3.  After you shampoo, rinse your hair and body thoroughly to remove the Shampoo.  4.  Use CHG as you would  any other liquid soap.  You can apply chg directly  to the skin and wash gently with scrungie or a clean washcloth.  5.  Apply the CHG Soap to your body ONLY FROM THE NECK DOWN.   Do not use on open wounds or open sores.  Avoid contact with your eyes,  ears, mouth and genitals (private parts).  Wash genitals (private parts)  with your normal soap.  6.  Wash thoroughly, paying special attention to the area where your surgery  will be performed.  7.  Thoroughly rinse your body with warm water from the neck down.  8.  DO NOT shower/wash with your normal soap after using and rinsing off   the CHG Soap.  9.  Pat yourself dry with a clean towel.            10.  Wear clean pajamas.            11.  Place clean sheets on your bed the night of your first shower and do not sleep with pets.  Day of Surgery  Do not apply any lotions/deoderants the morning of surgery.  Please wear clean clothes to  the hospital/surgery center.     Please read over the following fact sheets that you were given: Pain Booklet, Coughing and Deep Breathing and Surgical Site Infection Prevention

## 2014-08-29 MED ORDER — CEFAZOLIN SODIUM-DEXTROSE 2-3 GM-% IV SOLR
2.0000 g | INTRAVENOUS | Status: AC
  Administered 2014-08-30: 2 g via INTRAVENOUS
  Filled 2014-08-29: qty 50

## 2014-08-30 ENCOUNTER — Ambulatory Visit (HOSPITAL_COMMUNITY): Payer: Medicare Other | Admitting: Certified Registered Nurse Anesthetist

## 2014-08-30 ENCOUNTER — Encounter (HOSPITAL_COMMUNITY)
Admission: RE | Disposition: A | Discharge status: Home / Self Care | Payer: Self-pay | Source: Ambulatory Visit | Attending: Otolaryngology

## 2014-08-30 ENCOUNTER — Encounter (HOSPITAL_COMMUNITY): Payer: Medicare Other | Admitting: Certified Registered Nurse Anesthetist

## 2014-08-30 ENCOUNTER — Ambulatory Visit (HOSPITAL_COMMUNITY)
Admission: RE | Admit: 2014-08-30 | Discharge: 2014-08-30 | Disposition: A | Discharge status: Home / Self Care | Payer: Medicare Other | Source: Ambulatory Visit | Attending: Otolaryngology | Admitting: Otolaryngology

## 2014-08-30 ENCOUNTER — Encounter (HOSPITAL_COMMUNITY): Payer: Self-pay | Admitting: Certified Registered Nurse Anesthetist

## 2014-08-30 DIAGNOSIS — I10 Essential (primary) hypertension: Secondary | ICD-10-CM | POA: Insufficient documentation

## 2014-08-30 DIAGNOSIS — H902 Conductive hearing loss, unspecified: Secondary | ICD-10-CM | POA: Diagnosis not present

## 2014-08-30 DIAGNOSIS — Z87891 Personal history of nicotine dependence: Secondary | ICD-10-CM | POA: Diagnosis not present

## 2014-08-30 DIAGNOSIS — H9190 Unspecified hearing loss, unspecified ear: Secondary | ICD-10-CM | POA: Diagnosis present

## 2014-08-30 DIAGNOSIS — Z720 Tobacco use: Secondary | ICD-10-CM | POA: Insufficient documentation

## 2014-08-30 DIAGNOSIS — H900 Conductive hearing loss, bilateral: Secondary | ICD-10-CM | POA: Diagnosis not present

## 2014-08-30 DIAGNOSIS — Z79899 Other long term (current) drug therapy: Secondary | ICD-10-CM | POA: Insufficient documentation

## 2014-08-30 DIAGNOSIS — H9191 Unspecified hearing loss, right ear: Secondary | ICD-10-CM | POA: Diagnosis not present

## 2014-08-30 HISTORY — PX: BAHA REVISION: SHX5325

## 2014-08-30 SURGERY — REVISION, BONE ANCHORED HEARING AID
Anesthesia: General | Site: Ear | Laterality: Right

## 2014-08-30 MED ORDER — ONDANSETRON HCL 4 MG/2ML IJ SOLN
INTRAMUSCULAR | Status: AC
  Filled 2014-08-30: qty 2

## 2014-08-30 MED ORDER — GLYCOPYRROLATE 0.2 MG/ML IJ SOLN
INTRAMUSCULAR | Status: DC | PRN
  Administered 2014-08-30 (×2): .1 mg via INTRAVENOUS

## 2014-08-30 MED ORDER — 0.9 % SODIUM CHLORIDE (POUR BTL) OPTIME
TOPICAL | Status: DC | PRN
  Administered 2014-08-30: 1000 mL

## 2014-08-30 MED ORDER — PHENYLEPHRINE 40 MCG/ML (10ML) SYRINGE FOR IV PUSH (FOR BLOOD PRESSURE SUPPORT)
PREFILLED_SYRINGE | INTRAVENOUS | Status: AC
  Filled 2014-08-30: qty 10

## 2014-08-30 MED ORDER — LIDOCAINE-EPINEPHRINE 1 %-1:100000 IJ SOLN
INTRAMUSCULAR | Status: DC | PRN
  Administered 2014-08-30: 2 mL

## 2014-08-30 MED ORDER — LIDOCAINE-EPINEPHRINE 1 %-1:100000 IJ SOLN
INTRAMUSCULAR | Status: AC
  Filled 2014-08-30: qty 1

## 2014-08-30 MED ORDER — MIDAZOLAM HCL 2 MG/2ML IJ SOLN
INTRAMUSCULAR | Status: AC
  Filled 2014-08-30: qty 2

## 2014-08-30 MED ORDER — CLINDAMYCIN HCL 300 MG PO CAPS: 300.0000 mg | ORAL_CAPSULE | Freq: Three times a day (TID) | ORAL | Status: DC

## 2014-08-30 MED ORDER — ARTIFICIAL TEARS OP OINT
TOPICAL_OINTMENT | OPHTHALMIC | Status: AC
  Filled 2014-08-30: qty 3.5

## 2014-08-30 MED ORDER — LIDOCAINE HCL (CARDIAC) 20 MG/ML IV SOLN
INTRAVENOUS | Status: AC
  Filled 2014-08-30: qty 5

## 2014-08-30 MED ORDER — BACITRACIN ZINC 500 UNIT/GM EX OINT
TOPICAL_OINTMENT | CUTANEOUS | Status: DC | PRN
  Administered 2014-08-30: 1 via TOPICAL

## 2014-08-30 MED ORDER — PHENYLEPHRINE HCL 10 MG/ML IJ SOLN
INTRAMUSCULAR | Status: DC | PRN
  Administered 2014-08-30 (×2): 80 ug via INTRAVENOUS
  Administered 2014-08-30 (×4): 40 ug via INTRAVENOUS
  Administered 2014-08-30 (×3): 80 ug via INTRAVENOUS

## 2014-08-30 MED ORDER — EPHEDRINE SULFATE 50 MG/ML IJ SOLN
INTRAMUSCULAR | Status: DC | PRN
  Administered 2014-08-30 (×2): 5 mg via INTRAVENOUS
  Administered 2014-08-30: 10 mg via INTRAVENOUS
  Administered 2014-08-30: 5 mg via INTRAVENOUS

## 2014-08-30 MED ORDER — PROPOFOL 10 MG/ML IV BOLUS
INTRAVENOUS | Status: AC
  Filled 2014-08-30: qty 20

## 2014-08-30 MED ORDER — ONDANSETRON HCL 4 MG/2ML IJ SOLN: 4.0000 mg | Freq: Once | INTRAMUSCULAR | Status: DC | PRN

## 2014-08-30 MED ORDER — BACITRACIN ZINC 500 UNIT/GM EX OINT
TOPICAL_OINTMENT | CUTANEOUS | Status: AC
  Filled 2014-08-30: qty 15

## 2014-08-30 MED ORDER — FENTANYL CITRATE 0.05 MG/ML IJ SOLN
INTRAMUSCULAR | Status: AC
  Filled 2014-08-30: qty 5

## 2014-08-30 MED ORDER — HYDROMORPHONE HCL 1 MG/ML IJ SOLN: 0.2500 mg | INTRAMUSCULAR | Status: DC | PRN

## 2014-08-30 MED ORDER — LACTATED RINGERS IV SOLN
INTRAVENOUS | Status: DC | PRN
  Administered 2014-08-30: 07:00:00 via INTRAVENOUS

## 2014-08-30 MED ORDER — PROMETHAZINE HCL 25 MG RE SUPP: 25.0000 mg | Freq: Four times a day (QID) | RECTAL | Status: DC | PRN

## 2014-08-30 MED ORDER — HYDROCODONE-ACETAMINOPHEN 7.5-325 MG PO TABS: 1.0000 | ORAL_TABLET | Freq: Four times a day (QID) | ORAL | Status: DC | PRN

## 2014-08-30 MED ORDER — LIDOCAINE HCL (CARDIAC) 20 MG/ML IV SOLN
INTRAVENOUS | Status: DC | PRN
  Administered 2014-08-30: 80 mg via INTRAVENOUS

## 2014-08-30 MED ORDER — SUCCINYLCHOLINE CHLORIDE 20 MG/ML IJ SOLN
INTRAMUSCULAR | Status: AC
  Filled 2014-08-30: qty 1

## 2014-08-30 MED ORDER — SUCCINYLCHOLINE CHLORIDE 20 MG/ML IJ SOLN
INTRAMUSCULAR | Status: DC | PRN
  Administered 2014-08-30: 80 mg via INTRAVENOUS

## 2014-08-30 MED ORDER — PROPOFOL 10 MG/ML IV BOLUS
INTRAVENOUS | Status: DC | PRN
  Administered 2014-08-30: 130 mg via INTRAVENOUS

## 2014-08-30 MED ORDER — MINERAL OIL LIGHT 100 % EX OIL
TOPICAL_OIL | CUTANEOUS | Status: AC
  Filled 2014-08-30: qty 25

## 2014-08-30 MED ORDER — FENTANYL CITRATE 0.05 MG/ML IJ SOLN
INTRAMUSCULAR | Status: DC | PRN
  Administered 2014-08-30 (×2): 50 ug via INTRAVENOUS

## 2014-08-30 MED ORDER — ROCURONIUM BROMIDE 50 MG/5ML IV SOLN
INTRAVENOUS | Status: AC
Start: 2014-08-30 — End: 2014-08-30
  Filled 2014-08-30: qty 1

## 2014-08-30 MED ORDER — SODIUM CHLORIDE 0.9 % IJ SOLN
INTRAMUSCULAR | Status: AC
  Filled 2014-08-30: qty 10

## 2014-08-30 MED ORDER — ONDANSETRON HCL 4 MG/2ML IJ SOLN
INTRAMUSCULAR | Status: DC | PRN
  Administered 2014-08-30 (×2): 4 mg via INTRAVENOUS

## 2014-08-30 MED ORDER — EPHEDRINE SULFATE 50 MG/ML IJ SOLN
INTRAMUSCULAR | Status: AC
  Filled 2014-08-30: qty 1

## 2014-08-30 MED ORDER — MIDAZOLAM HCL 5 MG/5ML IJ SOLN
INTRAMUSCULAR | Status: DC | PRN
  Administered 2014-08-30: 1 mg via INTRAVENOUS

## 2014-08-30 SURGICAL SUPPLY — 49 items
BLADE DERMATOME DISP (MISCELLANEOUS) IMPLANT
BLADE SURG 15 STRL LF DISP TIS (BLADE) IMPLANT
BLADE SURG 15 STRL SS (BLADE) ×2
BLADE SURG ROTATE 9660 (MISCELLANEOUS) ×2 IMPLANT
CANISTER SUCTION 2500CC (MISCELLANEOUS) ×2 IMPLANT
CAP HEALING (MISCELLANEOUS) IMPLANT
CLEANER TIP ELECTROSURG 2X2 (MISCELLANEOUS) ×2 IMPLANT
CORDS BIPOLAR (ELECTRODE) ×1 IMPLANT
COVER SURGICAL LIGHT HANDLE (MISCELLANEOUS) ×2 IMPLANT
CRADLE DONUT ADULT HEAD (MISCELLANEOUS) ×1 IMPLANT
DECANTER SPIKE VIAL GLASS SM (MISCELLANEOUS) IMPLANT
DRAPE INCISE 23X17 IOBAN STRL (DRAPES)
DRAPE INCISE 23X17 STRL (DRAPES) IMPLANT
DRAPE INCISE IOBAN 23X17 STRL (DRAPES) IMPLANT
DRAPE U-SHAPE 76X120 STRL (DRAPES) ×1 IMPLANT
DRILL COUNTERSINK 3MM (DRILL) IMPLANT
DRILL COUNTERSINK 4MM (DRILL) IMPLANT
DRILL GUIDE 3-4MM (DRILL) IMPLANT
ELECT COATED BLADE 2.86 ST (ELECTRODE) ×2 IMPLANT
ELECT REM PT RETURN 9FT ADLT (ELECTROSURGICAL) ×2
ELECTRODE REM PT RTRN 9FT ADLT (ELECTROSURGICAL) ×1 IMPLANT
GAUZE SPONGE 2X2 8PLY STRL LF (GAUZE/BANDAGES/DRESSINGS) IMPLANT
GAUZE SPONGE 4X4 12PLY STRL (GAUZE/BANDAGES/DRESSINGS) ×1 IMPLANT
GAUZE SPONGE 4X4 16PLY XRAY LF (GAUZE/BANDAGES/DRESSINGS) ×2 IMPLANT
GLOVE BIO SURGEON STRL SZ7.5 (GLOVE) ×2 IMPLANT
GLOVE BIOGEL PI IND STRL 6.5 (GLOVE) IMPLANT
GLOVE BIOGEL PI INDICATOR 6.5 (GLOVE) ×2
GLOVE SURG SS PI 6.5 STRL IVOR (GLOVE) ×2 IMPLANT
GOWN STRL REUS W/ TWL LRG LVL3 (GOWN DISPOSABLE) ×1 IMPLANT
GOWN STRL REUS W/TWL LRG LVL3 (GOWN DISPOSABLE) ×6
IMPL EAR ABUTMENT 4MM W/10 (Head) IMPLANT
IMPLANT EAR ABUTMENT 4MM W/10 (Head) ×2 IMPLANT
KIT BASIN OR (CUSTOM PROCEDURE TRAY) ×2 IMPLANT
KIT ROOM TURNOVER OR (KITS) ×2 IMPLANT
NDL HYPO 25GX1X1/2 BEV (NEEDLE) IMPLANT
NEEDLE HYPO 25GX1X1/2 BEV (NEEDLE) ×2 IMPLANT
NS IRRIG 1000ML POUR BTL (IV SOLUTION) ×2 IMPLANT
PAD ARMBOARD 7.5X6 YLW CONV (MISCELLANEOUS) ×4 IMPLANT
PENCIL FOOT CONTROL (ELECTRODE) ×2 IMPLANT
PUNCH BIOPSY 4MM COCHLEAR (MISCELLANEOUS) ×1 IMPLANT
SPONGE GAUZE 2X2 STER 10/PKG (GAUZE/BANDAGES/DRESSINGS) ×1
SUT BONE WAX W31G (SUTURE) ×1 IMPLANT
SUT CHROMIC 3 0 SH 27 (SUTURE) ×2 IMPLANT
SUT ETHILON 4 0 PS 2 18 (SUTURE) ×2 IMPLANT
SYR BULB 3OZ (MISCELLANEOUS) IMPLANT
TOWEL OR 17X24 6PK STRL BLUE (TOWEL DISPOSABLE) ×1 IMPLANT
TRAY ENT MC OR (CUSTOM PROCEDURE TRAY) ×2 IMPLANT
WATER STERILE IRR 1000ML POUR (IV SOLUTION) IMPLANT
WIPE INSTRUMENT VISIWIPE 73X73 (MISCELLANEOUS) IMPLANT

## 2014-08-30 NOTE — Transfer of Care (Signed)
Immediate Anesthesia Transfer of Care Note  Patient: Kyle Hamilton  Procedure(s) Performed: Procedure(s): BONE ANCHORED HEARING AID (BAHA) REVISION RIGHT SIDE (Right)  Patient Location: PACU  Anesthesia Type:General  Level of Consciousness: awake, alert  and oriented  Airway & Oxygen Therapy: Patient Spontanous Breathing and Patient connected to nasal cannula oxygen  Post-op Assessment: Report given to PACU RN and Post -op Vital signs reviewed and stable  Post vital signs: Reviewed and stable  Complications: No apparent anesthesia complications

## 2014-08-30 NOTE — Discharge Instructions (Signed)
Keep surgical site clean and dry. Apply antibiotic ointment 2 times daily.

## 2014-08-30 NOTE — Anesthesia Procedure Notes (Signed)
Procedure Name: Intubation Date/Time: 08/30/2014 8:41 AM Performed by: ,  Pre-anesthesia Checklist: Patient identified, Timeout performed, Emergency Drugs available, Suction available and Patient being monitored Patient Re-evaluated:Patient Re-evaluated prior to inductionOxygen Delivery Method: Circle system utilized Preoxygenation: Pre-oxygenation with 100% oxygen Intubation Type: IV induction Laryngoscope Size: Mac and 4 Grade View: Grade II Tube type: Oral Tube size: 7.5 mm Number of attempts: 1 Airway Equipment and Method: Stylet and LTA kit utilized Placement Confirmation: breath sounds checked- equal and bilateral,  ETT inserted through vocal cords under direct vision,  positive ETCO2 and CO2 detector Secured at: 22 cm Tube secured with: Tape Dental Injury: Teeth and Oropharynx as per pre-operative assessment      

## 2014-08-30 NOTE — Anesthesia Preprocedure Evaluation (Addendum)
Anesthesia Evaluation  Patient identified by MRN, date of birth, ID band Patient awake    Reviewed: Allergy & Precautions, H&P , NPO status , Patient's Chart, lab work & pertinent test results  Airway Mallampati: II TM Distance: >3 FB Neck ROM: Full    Dental  (+) Edentulous Upper, Edentulous Lower, Dental Advisory Given   Pulmonary COPDformer smoker,          Cardiovascular hypertension,     Neuro/Psych    GI/Hepatic   Endo/Other    Renal/GU      Musculoskeletal   Abdominal   Peds  Hematology   Anesthesia Other Findings   Reproductive/Obstetrics                          Anesthesia Physical Anesthesia Plan  ASA: II  Anesthesia Plan: General   Post-op Pain Management:    Induction:   Airway Management Planned: Oral ETT  Additional Equipment:   Intra-op Plan:   Post-operative Plan: Extubation in OR  Informed Consent: I have reviewed the patients History and Physical, chart, labs and discussed the procedure including the risks, benefits and alternatives for the proposed anesthesia with the patient or authorized representative who has indicated his/her understanding and acceptance.     Plan Discussed with: CRNA, Anesthesiologist and Surgeon  Anesthesia Plan Comments:         Anesthesia Quick Evaluation

## 2014-08-30 NOTE — Op Note (Signed)
OPERATIVE REPORT  DATE OF SURGERY: 08/30/2014  PATIENT:  Kyle Hamilton,  58 y.o. male  PRE-OPERATIVE DIAGNOSIS:  HEARING LOSS  POST-OPERATIVE DIAGNOSIS:  HEARING LOSS  PROCEDURE:  Procedure(s): BONE ANCHORED HEARING AID (BAHA) REVISION RIGHT SIDE  SURGEON:  Beckie Salts, MD  ASSISTANTS: none  ANESTHESIA:   General   EBL:  10 ml  DRAINS: none  LOCAL MEDICATIONS USED:  None  SPECIMEN:  none  COUNTS:  Correct  PROCEDURE DETAILS: The patient was taken to the operating room and placed on the operating table in the supine position. Following induction of general endotracheal anesthesia the patient was prepped and draped and draped in a standard fashion the left the postauricular area. There were shaved. The proposed incision was marked and a 4 mm dermal punch was used to create the circular opening down to periosteum. Superior and inferior incisions were then added to facilitate exposure. The periosteum was exposed. The starter hole and countersink were then created as well using the BAHA system. Unfortunately, at this point I realized that the abutment that was available did not include the implant. The incision was irrigated and reapproximated with chromic suture. The right side was then prepped and draped in a sterile fashion. The original plan was to place the new implant and the button on the left side and to leave the right side alone. At this point we had to explore the right side to see if the implant was still in place and stable. The dermal punch was used to create a circular opening over where the abutment was felt to have been. The infant was immediately identified and was uncovered. It was still stable and in place. Upper and lower incisions were extended around the circular hole to facilitate exposure. The new abutment was then attached without difficulty. The wound was irrigated. Incisions were reapproximated on either side of the abutment with chromic suture. Bacitracin and a  dressing were applied. Patient was then awakened extubated and transferred to recovery in stable condition.    PATIENT DISPOSITION:  To PACU, stable

## 2014-08-30 NOTE — Interval H&P Note (Signed)
History and Physical Interval Note:  08/30/2014 8:16 AM  Kyle Hamilton  has presented today for surgery, with the diagnosis of HEARING LOSS  The various methods of treatment have been discussed with the patient and family. After consideration of risks, benefits and other options for treatment, the patient has consented to  Procedure(s): BONE ANCHORED HEARING AID (BAHA) REVISION LEFT SIDE (Left) as a surgical intervention .  The patient's history has been reviewed, patient examined, no change in status, stable for surgery.  I have reviewed the patient's chart and labs.  Questions were answered to the patient's satisfaction.     Trace Wirick

## 2014-08-30 NOTE — Anesthesia Postprocedure Evaluation (Signed)
  Anesthesia Post-op Note  Patient: Kyle Hamilton  Procedure(s) Performed: Procedure(s): BONE ANCHORED HEARING AID (BAHA) REVISION RIGHT SIDE (Right)  Patient Location: PACU  Anesthesia Type:General  Level of Consciousness: awake, alert , oriented and patient cooperative  Airway and Oxygen Therapy: Patient Spontanous Breathing  Post-op Pain: mild  Post-op Assessment: Post-op Vital signs reviewed, Patient's Cardiovascular Status Stable, Respiratory Function Stable, Patent Airway, No signs of Nausea or vomiting and Pain level controlled  Post-op Vital Signs: stable  Last Vitals:  Filed Vitals:   08/30/14 1000  BP: 112/69  Pulse: 64  Temp:   Resp: 14    Complications: No apparent anesthesia complications

## 2014-08-31 ENCOUNTER — Encounter (HOSPITAL_COMMUNITY): Payer: Self-pay | Admitting: Otolaryngology

## 2014-08-31 ENCOUNTER — Other Ambulatory Visit: Payer: Self-pay | Admitting: Otolaryngology

## 2014-08-31 DIAGNOSIS — R911 Solitary pulmonary nodule: Secondary | ICD-10-CM

## 2014-09-04 ENCOUNTER — Ambulatory Visit
Admission: RE | Admit: 2014-09-04 | Discharge: 2014-09-04 | Disposition: A | Discharge status: Home / Self Care | Payer: Medicare Other | Source: Ambulatory Visit | Attending: Otolaryngology | Admitting: Otolaryngology

## 2014-09-04 DIAGNOSIS — R911 Solitary pulmonary nodule: Secondary | ICD-10-CM

## 2014-09-04 DIAGNOSIS — J439 Emphysema, unspecified: Secondary | ICD-10-CM | POA: Diagnosis not present

## 2014-09-19 ENCOUNTER — Other Ambulatory Visit: Payer: Self-pay | Admitting: *Deleted

## 2014-09-19 ENCOUNTER — Encounter: Payer: Self-pay | Admitting: Cardiothoracic Surgery

## 2014-09-19 ENCOUNTER — Institutional Professional Consult (permissible substitution) (INDEPENDENT_AMBULATORY_CARE_PROVIDER_SITE_OTHER): Payer: Medicare Other | Admitting: Cardiothoracic Surgery

## 2014-09-19 VITALS — BP 130/80 | HR 54 | Resp 20 | Ht 65.0 in | Wt 131.0 lb

## 2014-09-19 DIAGNOSIS — R911 Solitary pulmonary nodule: Secondary | ICD-10-CM

## 2014-09-19 NOTE — Progress Notes (Signed)
PCP is NNODI, Doreene Burke, MD Referring Provider is Izora Gala, MD  Chief Complaint  Patient presents with  . Lung Lesion    Surgical eval on left lower lobe lung nodule, Chest CT 09/04/14  patient examined CT scan and chest reviewed  HPI: 58 year old Caucasian male reformed smoker who quit July 2015 presents for dilation of a 1.7 cm nodule in the superior segment of the left lower lobe. The patient had a chest x-ray earlier this fall at the time of ENT surgery to reimplant a hearing device in his right temporal bone. This demonstrated a possible pulmonary nodule. Chest CT scan was then performed which confirmed a left lower lobe superior segment 1.7 cm nodule. No suspicious mediastinal adenopathy. The patient does have evidence of emphysema associated with his long smoking history. The patient denies any pulmonary symptoms of cough hemoptysis weight loss headache or bone pain. Patient still works part time doing heavy equipment.  No family history lung cancer. No history of coronary disease angina arrhythmia  Patient will be evaluated for a possible early stage bronchogenic carcinoma with PET scan, brain CT scan and PFTs. Past Medical History  Diagnosis Date  . Hypertension   . Hearing loss   . COPD (chronic obstructive pulmonary disease)   . Spinal stenosis     Past Surgical History  Procedure Laterality Date  . Leg surgery      right leg deep cut  . Stapedes surgery      bilateral  . Bone anchored hearing aid implant  08/03/2012    Procedure: BONE ANCHORED HEARING AID (BAHA) IMPLANT;  Surgeon: Izora Gala, MD;  Location: [place];  Service: ENT;  Laterality: Right;  . Baha revision Right 08/30/2014    Procedure: BONE ANCHORED HEARING AID (BAHA) REVISION RIGHT SIDE;  Surgeon: Izora Gala, MD;  Location: [place];  Service: ENT;  Laterality: Right;    No family history on file.  Social History History  Substance Use Topics  . Smoking status: Former Smoker -- 1.00 packs/day for 35 years     Types: Cigarettes    Quit date: 05/11/2014  . Smokeless tobacco: Not on file  . Alcohol Use: No    Current Outpatient Prescriptions  Medication Sig Dispense Refill  . hydrochlorothiazide (HYDRODIURIL) 25 MG tablet Take 1 tablet (25 mg total) by mouth daily. 30 tablet 2   No current facility-administered medications for this visit.    No Known Allergies  Review of Systems  No history thoracic trauma or pneumothorax No history of cardiac disease No history of diabetes No history of TIA stroke or claudication No history DVT or varicose veins No bleeding problems No problems with general anesthesia for which he has had on several occasions for implantation of bilateral hearing devices  BP 130/80 mmHg  Pulse 54  Resp 20  Ht 5\' 5"  (1.651 m)  Wt 131 lb (59.421 kg)  BMI 21.80 kg/m2  SpO2 96% Physical Exam Gen. Appearance-middle-aged Caucasian male no acute distress HEENT normocephalic pupils equal dentition with full upper and lower plates Neck-no JVD mass or adenopathy Thorax-no deformity or tenderness breath sounds distant but clear Cardiac-regular rhythm without murmur or gallop Abdomen-nontender without pulsatile mass Extremities-mild clubbing and no cyanosis tenderness or edema Vascular-palpable pulses in all extremities, no venous insufficiency noted Neurologic-no focal motor deficit  Diagnostic Tests: CT scan reviewed patient showing the suspicious nodules. Segment left lower lobe  Impression: Possible early stage lung cancer found incidentally on chest x-ray prior to ENT surgery  Plan:proceed  with PET scan, head CT to complete clinical staging. Proceed with PFTs to assess pulmonary reserve for potential pulmonary resection

## 2014-09-26 ENCOUNTER — Encounter (HOSPITAL_COMMUNITY): Payer: Self-pay

## 2014-09-26 ENCOUNTER — Ambulatory Visit (HOSPITAL_COMMUNITY)
Admission: RE | Admit: 2014-09-26 | Discharge: 2014-09-26 | Disposition: A | Discharge status: Home / Self Care | Payer: Medicare Other | Source: Ambulatory Visit | Attending: Cardiothoracic Surgery | Admitting: Cardiothoracic Surgery

## 2014-09-26 ENCOUNTER — Encounter (HOSPITAL_COMMUNITY)
Admission: RE | Admit: 2014-09-26 | Discharge: 2014-09-26 | Disposition: A | Discharge status: Home / Self Care | Payer: Medicare Other | Source: Ambulatory Visit | Attending: Diagnostic Radiology | Admitting: Diagnostic Radiology

## 2014-09-26 DIAGNOSIS — R911 Solitary pulmonary nodule: Secondary | ICD-10-CM

## 2014-09-26 DIAGNOSIS — J439 Emphysema, unspecified: Secondary | ICD-10-CM | POA: Diagnosis not present

## 2014-09-26 DIAGNOSIS — R222 Localized swelling, mass and lump, trunk: Secondary | ICD-10-CM | POA: Diagnosis not present

## 2014-09-26 DIAGNOSIS — I7090 Unspecified atherosclerosis: Secondary | ICD-10-CM | POA: Insufficient documentation

## 2014-09-26 LAB — PULMONARY FUNCTION TEST
DL/VA % pred: 84 %
DL/VA: 3.61 ml/min/mmHg/L
DLCO unc % pred: 79 %
DLCO unc: 20.25 ml/min/mmHg
FEF 25-75 Post: 0.78 L/s
FEF 25-75 Pre: 0.35 L/s
FEF2575-%Change-Post: 121 %
FEF2575-%Pred-Post: 30 %
FEF2575-%Pred-Pre: 13 %
FEV1-%Change-Post: 32 %
FEV1-%Pred-Post: 54 %
FEV1-%Pred-Pre: 41 %
FEV1-Post: 1.67 L
FEV1-Pre: 1.25 L
FEV1FVC-%Change-Post: 14 %
FEV1FVC-%Pred-Pre: 53 %
FEV6-%Change-Post: 27 %
FEV6-%Pred-Post: 82 %
FEV6-%Pred-Pre: 64 %
FEV6-Post: 3.12 L
FEV6-Pre: 2.46 L
FEV6FVC-%Change-Post: 9 %
FEV6FVC-%Pred-Post: 92 %
FEV6FVC-%Pred-Pre: 84 %
FVC-%Change-Post: 16 %
FVC-%Pred-Post: 89 %
FVC-%Pred-Pre: 76 %
FVC-Post: 3.56 L
FVC-Pre: 3.06 L
Post FEV1/FVC ratio: 47 %
Post FEV6/FVC ratio: 88 %
Pre FEV1/FVC ratio: 41 %
Pre FEV6/FVC Ratio: 80 %
RV % pred: 214 %
RV: 4.17 L
TLC % pred: 125 %
TLC: 7.51 L

## 2014-09-26 LAB — GLUCOSE, CAPILLARY: Glucose-Capillary: 86 mg/dL (ref 70–99)

## 2014-09-26 MED ORDER — ALBUTEROL SULFATE (2.5 MG/3ML) 0.083% IN NEBU
2.5000 mg | INHALATION_SOLUTION | Freq: Once | RESPIRATORY_TRACT | Status: AC
  Administered 2014-09-26: 2.5 mg via RESPIRATORY_TRACT

## 2014-09-26 MED ORDER — FLUDEOXYGLUCOSE F - 18 (FDG) INJECTION
6.5000 | Freq: Once | INTRAVENOUS | Status: AC | PRN
  Administered 2014-09-26: 6.5 via INTRAVENOUS

## 2014-10-03 ENCOUNTER — Ambulatory Visit: Payer: Medicare Other | Admitting: Cardiothoracic Surgery

## 2014-10-03 ENCOUNTER — Ambulatory Visit (INDEPENDENT_AMBULATORY_CARE_PROVIDER_SITE_OTHER): Payer: Medicare Other | Admitting: Cardiothoracic Surgery

## 2014-10-03 ENCOUNTER — Encounter: Payer: Self-pay | Admitting: Cardiothoracic Surgery

## 2014-10-03 ENCOUNTER — Other Ambulatory Visit: Payer: Self-pay | Admitting: *Deleted

## 2014-10-03 VITALS — BP 138/85 | HR 60 | Resp 20 | Ht 65.0 in | Wt 130.0 lb

## 2014-10-03 DIAGNOSIS — R911 Solitary pulmonary nodule: Secondary | ICD-10-CM | POA: Diagnosis not present

## 2014-10-03 NOTE — Progress Notes (Signed)
PCP is NNODI, Doreene Burke, MD Referring Provider is Izora Gala, MD  Chief Complaint  Patient presents with  . Lung Lesion    2 week f/u fot review test results, PFT's, PET Scan, Head CT     HPI:the patient is a 58 year old Caucasian male reformed smoker with COPD who presents for further evaluation of a recent diagnosed 1.7 cm nodule in the superior segment left lower lobe. The patient has severe hearing loss and has had bilateral external hearing aid implantation. One of the chest x-rays taken during the procedure showed pulmonary nodule. A PET scan has been performed showing the nodule has hypermetabolic activity SUV 6.5. No other pulmonary nodules with activity-no mediastinal adenopathy with activity. No intra-abdominal evidence of metastatic disease by PET scan. Brain CT scan shows no evidence of metastatic disease Patient's PFTs show FVC 3.1, FEV1 1.4, and diffusion capacity 46% of predicted consistent with moderate to severe COPD. The patient is a nonsmoker. He is not on home oxygen. He can ambulate a significant distance without shortness of breath.  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 It appears on CT scan that this will require lobectomy-but because of his significant COPD would try an adequate wedge resection if adequate margins could be obtained.   Past Medical History  Diagnosis Date  . Hypertension   . Hearing loss   . COPD (chronic obstructive pulmonary disease)   . Spinal stenosis     Past Surgical History  Procedure Laterality Date  . Leg surgery      right leg deep cut  . Stapedes surgery      bilateral  . Bone anchored hearing aid implant  08/03/2012    Procedure: BONE ANCHORED HEARING AID (BAHA) IMPLANT;  Surgeon: Izora Gala, MD;  Location: [place];  Service: ENT;  Laterality: Right;  . Baha revision Right 08/30/2014    Procedure: BONE ANCHORED HEARING AID (BAHA) REVISION RIGHT  SIDE;  Surgeon: Izora Gala, MD;  Location: [place];  Service: ENT;  Laterality: Right;    No family history on file.  Social History History  Substance Use Topics  . Smoking status: Former Smoker -- 1.00 packs/day for 35 years    Types: Cigarettes    Quit date: 05/11/2014  . Smokeless tobacco: Not on file  . Alcohol Use: No    Current Outpatient Prescriptions  Medication Sig Dispense Refill  . hydrochlorothiazide (HYDRODIURIL) 25 MG tablet Take 1 tablet (25 mg total) by mouth daily. 30 tablet 2   No current facility-administered medications for this visit.    No Known Allergies  Review of Systems  No loss of appetite no fever no productive cough Patient is retired Nature conservation officer  BP 138/85 mmHg  Pulse 60  Resp 20  Ht 5\' 5"  (1.651 m)  Wt 130 lb (58.968 kg)  BMI 21.63 kg/m2  SpO2 98% Physical Exam Alert and pleasant Lungs clear Heart irregular Slight scissored gait due to 1 leg being shorter  Diagnostic Tests: PET scan, head CT scan, PFTs are reviewed with patient  Impression: Left lower lobe nodule, clinical stage I bronchogenic carcinoma. Surgery indicated for resection  Plan:plan left VATS with resection of left lower lobe nodule on December 1 at [facility]. Indications benefits alternatives and risks discussed in full patient and his male friend  whowas present during the visit.

## 2014-10-06 NOTE — Pre-Procedure Instructions (Signed)
Kyle Hamilton  10/06/2014   Your procedure is scheduled on:  December 1  Report to [facility] Admitting at 05:30 AM.  Call this number if you have problems the morning of surgery: [phone]   Remember:   Do not eat food or drink liquids after midnight.   Take these medicines the morning of surgery with A SIP OF WATER: None   STOP/ Do not take Aspirin, Aleve, Naproxen, Advil, Ibuprofen, Motrin, Vitamins, Herbs, or Supplements today   Do not wear jewelry, make-up or nail polish.  Do not wear lotions, powders, or perfumes. You may wear deodorant.  Do not shave 48 hours prior to surgery. Men may shave face and neck.  Do not bring valuables to the hospital.  [facility] is not responsible for any belongings or valuables.               Contacts, dentures or bridgework may not be worn into surgery.  Leave suitcase in the car. After surgery it may be brought to your room.  For patients admitted to the hospital, discharge time is determined by your treatment team.               Special Instructions: [place] - Preparing for Surgery  Before surgery, you can play an important role.  Because skin is not sterile, your skin needs to be as free of germs as possible.  You can reduce the number of germs on you skin by washing with CHG (chlorahexidine gluconate) soap before surgery.  CHG is an antiseptic cleaner which kills germs and bonds with the skin to continue killing germs even after washing.  Please DO NOT use if you have an allergy to CHG or antibacterial soaps.  If your skin becomes reddened/irritated stop using the CHG and inform your nurse when you arrive at Short Stay.  Do not shave (including legs and underarms) for at least 48 hours prior to the first CHG shower.  You may shave your face.  Please follow these instructions carefully:   1.  Shower with CHG Soap the night before surgery and the morning of Surgery.  2.  If you choose to wash your hair, wash your hair first  as usual with your normal shampoo.  3.  After you shampoo, rinse your hair and body thoroughly to remove the shampoo.  4.  Use CHG as you would any other liquid soap.  You can apply CHG directly to the skin and wash gently with scrungie or a clean washcloth.  5.  Apply the CHG Soap to your body ONLY FROM THE NECK DOWN.  Do not use on open wounds or open sores.  Avoid contact with your eyes, ears, mouth and genitals (private parts).  Wash genitals (private parts) with your normal soap.  6.  Wash thoroughly, paying special attention to the area where your surgery will be performed.  7.  Thoroughly rinse your body with warm water from the neck down.  8.  DO NOT shower/wash with your normal soap after using and rinsing off the CHG Soap.  9.  Pat yourself dry with a clean towel.            10.  Wear clean pajamas.            11.  Place clean sheets on your bed the night of your first shower and do not sleep with pets.  Day of Surgery  Do not apply any lotions the morning of surgery.  Please wear clean clothes to the hospital/surgery center.     Please read over the following fact sheets that you were given: Pain Booklet, Coughing and Deep Breathing, Blood Transfusion Information and Surgical Site Infection Prevention

## 2014-10-08 ENCOUNTER — Encounter (HOSPITAL_COMMUNITY)
Admission: RE | Admit: 2014-10-08 | Discharge: 2014-10-08 | Disposition: A | Discharge status: Home / Self Care | Payer: Medicare Other | Source: Ambulatory Visit | Attending: Cardiothoracic Surgery | Admitting: Cardiothoracic Surgery

## 2014-10-08 ENCOUNTER — Encounter (HOSPITAL_COMMUNITY): Payer: Self-pay

## 2014-10-08 ENCOUNTER — Ambulatory Visit (HOSPITAL_COMMUNITY)
Admission: RE | Admit: 2014-10-08 | Discharge: 2014-10-08 | Disposition: A | Discharge status: Home / Self Care | Payer: Medicare Other | Source: Ambulatory Visit | Attending: Cardiothoracic Surgery | Admitting: Cardiothoracic Surgery

## 2014-10-08 VITALS — BP 126/88 | HR 46 | Temp 97.7°F | Resp 20 | Ht 65.0 in | Wt 130.2 lb

## 2014-10-08 DIAGNOSIS — Z01818 Encounter for other preprocedural examination: Secondary | ICD-10-CM | POA: Diagnosis not present

## 2014-10-08 DIAGNOSIS — R911 Solitary pulmonary nodule: Secondary | ICD-10-CM

## 2014-10-08 DIAGNOSIS — J40 Bronchitis, not specified as acute or chronic: Secondary | ICD-10-CM | POA: Diagnosis not present

## 2014-10-08 HISTORY — DX: Reserved for inherently not codable concepts without codable children: IMO0001

## 2014-10-08 LAB — URINALYSIS, ROUTINE W REFLEX MICROSCOPIC
Bilirubin Urine: NEGATIVE
Glucose, UA: NEGATIVE mg/dL
Hgb urine dipstick: NEGATIVE
Ketones, ur: NEGATIVE mg/dL
Leukocytes, UA: NEGATIVE
Nitrite: NEGATIVE
Protein, ur: NEGATIVE mg/dL
Specific Gravity, Urine: 1.008 (ref 1.005–1.030)
Urobilinogen, UA: 0.2 mg/dL (ref 0.0–1.0)
pH: 6.5 (ref 5.0–8.0)

## 2014-10-08 LAB — COMPREHENSIVE METABOLIC PANEL
ALT: 12 U/L (ref 0–53)
AST: 20 U/L (ref 0–37)
Albumin: 3.9 g/dL (ref 3.5–5.2)
Alkaline Phosphatase: 68 U/L (ref 39–117)
Anion gap: 15 (ref 5–15)
BUN: 12 mg/dL (ref 6–23)
CO2: 23 mEq/L (ref 19–32)
Calcium: 9.4 mg/dL (ref 8.4–10.5)
Chloride: 100 mEq/L (ref 96–112)
Creatinine, Ser: 0.61 mg/dL (ref 0.50–1.35)
GFR calc Af Amer: >90 mL/min (ref >90)
GFR calc non Af Amer: >90 mL/min (ref >90)
Glucose, Bld: 89 mg/dL (ref 70–99)
Potassium: 3.8 mEq/L (ref 3.7–5.3)
Sodium: 138 mEq/L (ref 137–147)
Total Bilirubin: 0.3 mg/dL (ref 0.3–1.2)
Total Protein: 7.1 g/dL (ref 6.0–8.3)

## 2014-10-08 LAB — PROTIME-INR
INR: 1.02 (ref 0.00–1.49)
Prothrombin Time: 13.5 seconds (ref 11.6–15.2)

## 2014-10-08 LAB — CBC
HCT: 46.4 % (ref 39.0–52.0)
Hemoglobin: 15.6 g/dL (ref 13.0–17.0)
MCH: 30.1 pg (ref 26.0–34.0)
MCHC: 33.6 g/dL (ref 30.0–36.0)
MCV: 89.4 fL (ref 78.0–100.0)
Platelets: 265 10*3/uL (ref 150–400)
RBC: 5.19 MIL/uL (ref 4.22–5.81)
RDW: 13.4 % (ref 11.5–15.5)
WBC: 6.4 10*3/uL (ref 4.0–10.5)

## 2014-10-08 LAB — BLOOD GAS, ARTERIAL
Acid-Base Excess: 3.9 mmol/L — ABNORMAL HIGH (ref 0.0–2.0)
Bicarbonate: 28 mEq/L — ABNORMAL HIGH (ref 20.0–24.0)
Drawn by: 206361
FIO2: 0.21 %
O2 Saturation: 95.6 %
Patient temperature: 98.6
TCO2: 29.4 mmol/L (ref 0–100)
pCO2 arterial: 43.5 mmHg (ref 35.0–45.0)
pH, Arterial: 7.425 (ref 7.350–7.450)
pO2, Arterial: 75.9 mmHg — ABNORMAL LOW (ref 80.0–100.0)

## 2014-10-08 LAB — TYPE AND SCREEN
ABO/RH(D): B POS
Antibody Screen: NEGATIVE

## 2014-10-08 LAB — ABO/RH: ABO/RH(D): B POS

## 2014-10-08 LAB — SURGICAL PCR SCREEN
MRSA, PCR: NEGATIVE
Staphylococcus aureus: NEGATIVE

## 2014-10-08 LAB — APTT: aPTT: 32 seconds (ref 24–37)

## 2014-10-08 MED ORDER — CEFUROXIME SODIUM 1.5 G IJ SOLR
1.5000 g | INTRAMUSCULAR | Status: AC
  Administered 2014-10-09: 1.5 g via INTRAVENOUS
  Filled 2014-10-08: qty 1.5

## 2014-10-09 ENCOUNTER — Encounter (HOSPITAL_COMMUNITY)
Admission: RE | Disposition: A | Discharge status: Home / Self Care | Payer: Self-pay | Source: Ambulatory Visit | Attending: Cardiothoracic Surgery

## 2014-10-09 ENCOUNTER — Inpatient Hospital Stay (HOSPITAL_COMMUNITY): Payer: Medicare Other

## 2014-10-09 ENCOUNTER — Encounter (HOSPITAL_COMMUNITY): Payer: Self-pay | Admitting: General Surgery

## 2014-10-09 ENCOUNTER — Inpatient Hospital Stay (HOSPITAL_COMMUNITY)
Admission: RE | Admit: 2014-10-09 | Discharge: 2014-10-17 | DRG: 164 | Disposition: A | Discharge status: Home / Self Care | Payer: Medicare Other | Source: Ambulatory Visit | Attending: Cardiothoracic Surgery | Admitting: Cardiothoracic Surgery

## 2014-10-09 ENCOUNTER — Inpatient Hospital Stay (HOSPITAL_COMMUNITY): Payer: Medicare Other | Admitting: Certified Registered Nurse Anesthetist

## 2014-10-09 DIAGNOSIS — H9193 Unspecified hearing loss, bilateral: Secondary | ICD-10-CM | POA: Diagnosis present

## 2014-10-09 DIAGNOSIS — I493 Ventricular premature depolarization: Secondary | ICD-10-CM | POA: Diagnosis not present

## 2014-10-09 DIAGNOSIS — I517 Cardiomegaly: Secondary | ICD-10-CM | POA: Diagnosis not present

## 2014-10-09 DIAGNOSIS — J449 Chronic obstructive pulmonary disease, unspecified: Secondary | ICD-10-CM | POA: Diagnosis present

## 2014-10-09 DIAGNOSIS — Z9689 Presence of other specified functional implants: Secondary | ICD-10-CM

## 2014-10-09 DIAGNOSIS — C3412 Malignant neoplasm of upper lobe, left bronchus or lung: Secondary | ICD-10-CM | POA: Diagnosis not present

## 2014-10-09 DIAGNOSIS — Z4682 Encounter for fitting and adjustment of non-vascular catheter: Secondary | ICD-10-CM | POA: Diagnosis not present

## 2014-10-09 DIAGNOSIS — C3432 Malignant neoplasm of lower lobe, left bronchus or lung: Principal | ICD-10-CM | POA: Diagnosis present

## 2014-10-09 DIAGNOSIS — Z9889 Other specified postprocedural states: Secondary | ICD-10-CM

## 2014-10-09 DIAGNOSIS — J9811 Atelectasis: Secondary | ICD-10-CM | POA: Diagnosis not present

## 2014-10-09 DIAGNOSIS — Z902 Acquired absence of lung [part of]: Secondary | ICD-10-CM | POA: Diagnosis not present

## 2014-10-09 DIAGNOSIS — I10 Essential (primary) hypertension: Secondary | ICD-10-CM | POA: Diagnosis present

## 2014-10-09 DIAGNOSIS — R918 Other nonspecific abnormal finding of lung field: Secondary | ICD-10-CM | POA: Diagnosis present

## 2014-10-09 DIAGNOSIS — Z87891 Personal history of nicotine dependence: Secondary | ICD-10-CM | POA: Diagnosis not present

## 2014-10-09 DIAGNOSIS — R911 Solitary pulmonary nodule: Secondary | ICD-10-CM

## 2014-10-09 DIAGNOSIS — J939 Pneumothorax, unspecified: Secondary | ICD-10-CM | POA: Diagnosis not present

## 2014-10-09 DIAGNOSIS — R0602 Shortness of breath: Secondary | ICD-10-CM

## 2014-10-09 DIAGNOSIS — J439 Emphysema, unspecified: Secondary | ICD-10-CM | POA: Diagnosis not present

## 2014-10-09 HISTORY — PX: VIDEO ASSISTED THORACOSCOPY (VATS)/ LOBECTOMY: SHX6169

## 2014-10-09 LAB — BLOOD GAS, ARTERIAL
ACID-BASE EXCESS: 2 mmol/L (ref 0.0–2.0)
Bicarbonate: 27.9 mEq/L — ABNORMAL HIGH (ref 20.0–24.0)
Drawn by: 128731
O2 CONTENT: 2 L/min
O2 SAT: 98.2 %
PO2 ART: 144 mmHg — AB (ref 80.0–100.0)
Patient temperature: 98.6
TCO2: 29.7 mmol/L (ref 0–100)
pCO2 arterial: 59.3 mmHg (ref 35.0–45.0)
pH, Arterial: 7.294 — ABNORMAL LOW (ref 7.350–7.450)

## 2014-10-09 LAB — GLUCOSE, CAPILLARY
Glucose-Capillary: 97 mg/dL (ref 70–99)
Glucose-Capillary: 120 mg/dL — ABNORMAL HIGH (ref 70–99)
Glucose-Capillary: 119 mg/dL — ABNORMAL HIGH (ref 70–99)

## 2014-10-09 SURGERY — VIDEO ASSISTED THORACOSCOPY (VATS)/ LOBECTOMY
Anesthesia: General | Site: Chest | Laterality: Left

## 2014-10-09 MED ORDER — BUPIVACAINE HCL 0.5 % IJ SOLN
INTRAMUSCULAR | Status: DC | PRN
  Administered 2014-10-09: 10 mL

## 2014-10-09 MED ORDER — LACTATED RINGERS IV SOLN
INTRAVENOUS | Status: DC | PRN
  Administered 2014-10-09: 08:00:00 via INTRAVENOUS

## 2014-10-09 MED ORDER — BUPIVACAINE HCL (PF) 0.5 % IJ SOLN
INTRAMUSCULAR | Status: AC
  Filled 2014-10-09: qty 10

## 2014-10-09 MED ORDER — FENTANYL CITRATE 0.05 MG/ML IJ SOLN
INTRAMUSCULAR | Status: AC
  Filled 2014-10-09: qty 2

## 2014-10-09 MED ORDER — ONDANSETRON HCL 4 MG/2ML IJ SOLN: 4.0000 mg | Freq: Once | INTRAMUSCULAR | Status: DC | PRN

## 2014-10-09 MED ORDER — PROPOFOL 10 MG/ML IV BOLUS
INTRAVENOUS | Status: DC | PRN
  Administered 2014-10-09: 120 mg via INTRAVENOUS
  Administered 2014-10-09: 20 mg via INTRAVENOUS

## 2014-10-09 MED ORDER — BUPIVACAINE ON-Q PAIN PUMP (FOR ORDER SET NO CHG): INJECTION | Status: DC

## 2014-10-09 MED ORDER — LIDOCAINE HCL (CARDIAC) 20 MG/ML IV SOLN
INTRAVENOUS | Status: DC | PRN
  Administered 2014-10-09: 20 mg via INTRAVENOUS
  Administered 2014-10-09: 40 mg via INTRAVENOUS

## 2014-10-09 MED ORDER — FENTANYL 10 MCG/ML IV SOLN
INTRAVENOUS | Status: DC
  Administered 2014-10-09: 12:00:00 via INTRAVENOUS
  Administered 2014-10-09: 60 ug via INTRAVENOUS
  Administered 2014-10-09: 120 ug via INTRAVENOUS
  Administered 2014-10-10: 15 ug via INTRAVENOUS
  Administered 2014-10-10: 0 ug via INTRAVENOUS
  Administered 2014-10-10: 60 ug via INTRAVENOUS
  Administered 2014-10-10: 0 ug via INTRAVENOUS
  Administered 2014-10-10: 15 ug via INTRAVENOUS
  Administered 2014-10-10: 75 ug via INTRAVENOUS
  Administered 2014-10-11: 60 ug via INTRAVENOUS
  Administered 2014-10-11: 105 ug via INTRAVENOUS
  Administered 2014-10-11: 04:00:00 via INTRAVENOUS
  Administered 2014-10-11: 135 ug via INTRAVENOUS
  Administered 2014-10-11: 98.24 ug via INTRAVENOUS
  Administered 2014-10-11: 45 ug via INTRAVENOUS
  Administered 2014-10-11 – 2014-10-12 (×2): 150 ug via INTRAVENOUS
  Administered 2014-10-12: 13:00:00 via INTRAVENOUS
  Administered 2014-10-12: 222.4 ug via INTRAVENOUS
  Administered 2014-10-12: 135.9 ug via INTRAVENOUS
  Administered 2014-10-12: 210 ug via INTRAVENOUS
  Administered 2014-10-12: 45 ug via INTRAVENOUS
  Administered 2014-10-12: 115 ug via INTRAVENOUS
  Administered 2014-10-13: 117 ug via INTRAVENOUS
  Administered 2014-10-13: 0 ug via INTRAVENOUS
  Administered 2014-10-13 (×2): 90 ug via INTRAVENOUS
  Administered 2014-10-13: 02:00:00 via INTRAVENOUS
  Administered 2014-10-13: 75 ug via INTRAVENOUS
  Administered 2014-10-13: 15 ug via INTRAVENOUS
  Administered 2014-10-13: 105 ug via INTRAVENOUS
  Administered 2014-10-14: 120 ug via INTRAVENOUS
  Administered 2014-10-14: 75 ug via INTRAVENOUS
  Administered 2014-10-14: 17:00:00 via INTRAVENOUS
  Administered 2014-10-14: 120 ug via INTRAVENOUS
  Administered 2014-10-14: 02:00:00 via INTRAVENOUS
  Administered 2014-10-14: 120 ug via INTRAVENOUS
  Administered 2014-10-14: 105 ug via INTRAVENOUS
  Administered 2014-10-14: 60 ug via INTRAVENOUS
  Administered 2014-10-14: 52.4 ug via INTRAVENOUS
  Administered 2014-10-15 (×2): 60 ug via INTRAVENOUS
  Administered 2014-10-15: 120 ug via INTRAVENOUS
  Filled 2014-10-09 (×9): qty 50

## 2014-10-09 MED ORDER — OXYCODONE HCL 5 MG PO TABS
5.0000 mg | ORAL_TABLET | ORAL | Status: DC | PRN
  Administered 2014-10-09: 5 mg via ORAL
  Administered 2014-10-11 – 2014-10-17 (×11): 10 mg via ORAL
  Filled 2014-10-09: qty 1
  Filled 2014-10-09 (×11): qty 2

## 2014-10-09 MED ORDER — NEOSTIGMINE METHYLSULFATE 10 MG/10ML IV SOLN
INTRAVENOUS | Status: DC | PRN
  Administered 2014-10-09: 3 mg via INTRAVENOUS

## 2014-10-09 MED ORDER — SENNOSIDES-DOCUSATE SODIUM 8.6-50 MG PO TABS
1.0000 | ORAL_TABLET | Freq: Every day | ORAL | Status: DC
  Administered 2014-10-10 – 2014-10-16 (×3): 1 via ORAL
  Filled 2014-10-09 (×10): qty 1

## 2014-10-09 MED ORDER — ACETAMINOPHEN 500 MG PO TABS
1000.0000 mg | ORAL_TABLET | Freq: Four times a day (QID) | ORAL | Status: AC
  Administered 2014-10-09 – 2014-10-11 (×5): 1000 mg via ORAL
  Filled 2014-10-09 (×20): qty 2

## 2014-10-09 MED ORDER — MIDAZOLAM HCL 2 MG/2ML IJ SOLN
INTRAMUSCULAR | Status: AC
  Filled 2014-10-09: qty 2

## 2014-10-09 MED ORDER — ONDANSETRON HCL 4 MG/2ML IJ SOLN
INTRAMUSCULAR | Status: AC
  Filled 2014-10-09: qty 2

## 2014-10-09 MED ORDER — FENTANYL CITRATE 0.05 MG/ML IJ SOLN
INTRAMUSCULAR | Status: DC | PRN
  Administered 2014-10-09 (×2): 100 ug via INTRAVENOUS
  Administered 2014-10-09 (×6): 50 ug via INTRAVENOUS

## 2014-10-09 MED ORDER — FENTANYL CITRATE 0.05 MG/ML IJ SOLN
25.0000 ug | INTRAMUSCULAR | Status: DC | PRN
  Administered 2014-10-09 (×3): 50 ug via INTRAVENOUS

## 2014-10-09 MED ORDER — PANTOPRAZOLE SODIUM 40 MG PO TBEC
40.0000 mg | DELAYED_RELEASE_TABLET | Freq: Every day | ORAL | Status: DC
  Administered 2014-10-10 – 2014-10-17 (×8): 40 mg via ORAL
  Filled 2014-10-09 (×8): qty 1

## 2014-10-09 MED ORDER — FENTANYL CITRATE 0.05 MG/ML IJ SOLN
INTRAMUSCULAR | Status: AC
  Filled 2014-10-09: qty 5

## 2014-10-09 MED ORDER — KETOROLAC TROMETHAMINE 15 MG/ML IJ SOLN
15.0000 mg | Freq: Four times a day (QID) | INTRAMUSCULAR | Status: AC
  Administered 2014-10-09 – 2014-10-10 (×3): 15 mg via INTRAVENOUS
  Filled 2014-10-09 (×4): qty 1

## 2014-10-09 MED ORDER — LIDOCAINE HCL (CARDIAC) 20 MG/ML IV SOLN
INTRAVENOUS | Status: AC
  Filled 2014-10-09: qty 5

## 2014-10-09 MED ORDER — 0.9 % SODIUM CHLORIDE (POUR BTL) OPTIME
TOPICAL | Status: DC | PRN
  Administered 2014-10-09: 1000 mL

## 2014-10-09 MED ORDER — ACETAMINOPHEN 160 MG/5ML PO SOLN
1000.0000 mg | Freq: Four times a day (QID) | ORAL | Status: AC
  Filled 2014-10-09: qty 40

## 2014-10-09 MED ORDER — NALOXONE HCL 0.4 MG/ML IJ SOLN
0.4000 mg | INTRAMUSCULAR | Status: DC | PRN
  Filled 2014-10-09: qty 1

## 2014-10-09 MED ORDER — KCL IN DEXTROSE-NACL 20-5-0.2 MEQ/L-%-% IV SOLN
INTRAVENOUS | Status: DC
  Administered 2014-10-09 – 2014-10-12 (×3): via INTRAVENOUS
  Filled 2014-10-09 (×9): qty 1000

## 2014-10-09 MED ORDER — BISACODYL 5 MG PO TBEC
10.0000 mg | DELAYED_RELEASE_TABLET | Freq: Every day | ORAL | Status: DC
  Administered 2014-10-09 – 2014-10-17 (×4): 10 mg via ORAL
  Filled 2014-10-09 (×5): qty 2

## 2014-10-09 MED ORDER — PROPOFOL 10 MG/ML IV BOLUS
INTRAVENOUS | Status: AC
  Filled 2014-10-09: qty 20

## 2014-10-09 MED ORDER — NEOSTIGMINE METHYLSULFATE 10 MG/10ML IV SOLN
INTRAVENOUS | Status: AC
  Filled 2014-10-09: qty 1

## 2014-10-09 MED ORDER — DIPHENHYDRAMINE HCL 50 MG/ML IJ SOLN
12.5000 mg | Freq: Four times a day (QID) | INTRAMUSCULAR | Status: DC | PRN
  Filled 2014-10-09: qty 0.25

## 2014-10-09 MED ORDER — INSULIN ASPART 100 UNIT/ML ~~LOC~~ SOLN
0.0000 [IU] | Freq: Four times a day (QID) | SUBCUTANEOUS | Status: DC
  Administered 2014-10-09: 2 [IU] via SUBCUTANEOUS

## 2014-10-09 MED ORDER — DIPHENHYDRAMINE HCL 12.5 MG/5ML PO ELIX
12.5000 mg | ORAL_SOLUTION | Freq: Four times a day (QID) | ORAL | Status: DC | PRN
  Filled 2014-10-09: qty 5

## 2014-10-09 MED ORDER — ONDANSETRON HCL 4 MG/2ML IJ SOLN
INTRAMUSCULAR | Status: DC | PRN
  Administered 2014-10-09: 4 mg via INTRAVENOUS

## 2014-10-09 MED ORDER — GLYCOPYRROLATE 0.2 MG/ML IJ SOLN
INTRAMUSCULAR | Status: DC | PRN
  Administered 2014-10-09: 0.4 mg via INTRAVENOUS

## 2014-10-09 MED ORDER — ONDANSETRON HCL 4 MG/2ML IJ SOLN
4.0000 mg | Freq: Four times a day (QID) | INTRAMUSCULAR | Status: DC | PRN
  Filled 2014-10-09: qty 2

## 2014-10-09 MED ORDER — SODIUM CHLORIDE 0.9 % IJ SOLN: 9.0000 mL | INTRAMUSCULAR | Status: DC | PRN

## 2014-10-09 MED ORDER — ONDANSETRON HCL 4 MG/2ML IJ SOLN
4.0000 mg | Freq: Four times a day (QID) | INTRAMUSCULAR | Status: DC | PRN
  Administered 2014-10-10 – 2014-10-11 (×2): 4 mg via INTRAVENOUS
  Filled 2014-10-09 (×2): qty 2

## 2014-10-09 MED ORDER — OXYCODONE HCL 5 MG/5ML PO SOLN: 5.0000 mg | Freq: Once | ORAL | Status: AC | PRN

## 2014-10-09 MED ORDER — CEFUROXIME SODIUM 1.5 G IJ SOLR
1.5000 g | Freq: Two times a day (BID) | INTRAMUSCULAR | Status: AC
  Administered 2014-10-09 – 2014-10-10 (×2): 1.5 g via INTRAVENOUS
  Filled 2014-10-09 (×2): qty 1.5

## 2014-10-09 MED ORDER — TRAMADOL HCL 50 MG PO TABS
ORAL_TABLET | ORAL | Status: AC
  Filled 2014-10-09: qty 2

## 2014-10-09 MED ORDER — TRAMADOL HCL 50 MG PO TABS
50.0000 mg | ORAL_TABLET | Freq: Four times a day (QID) | ORAL | Status: DC | PRN
  Administered 2014-10-09: 100 mg via ORAL

## 2014-10-09 MED ORDER — OXYCODONE HCL 5 MG PO TABS
5.0000 mg | ORAL_TABLET | Freq: Once | ORAL | Status: AC | PRN
  Administered 2014-10-09: 5 mg via ORAL

## 2014-10-09 MED ORDER — HEMOSTATIC AGENTS (NO CHARGE) OPTIME
TOPICAL | Status: DC | PRN
  Administered 2014-10-09: 1 via TOPICAL

## 2014-10-09 MED ORDER — MIDAZOLAM HCL 5 MG/5ML IJ SOLN
INTRAMUSCULAR | Status: DC | PRN
  Administered 2014-10-09: 2 mg via INTRAVENOUS

## 2014-10-09 MED ORDER — ROCURONIUM BROMIDE 100 MG/10ML IV SOLN
INTRAVENOUS | Status: DC | PRN
  Administered 2014-10-09: 40 mg via INTRAVENOUS

## 2014-10-09 MED ORDER — BUPIVACAINE 0.5 % ON-Q PUMP SINGLE CATH 400 ML
400.0000 mL | INJECTION | Status: DC
  Filled 2014-10-09: qty 400

## 2014-10-09 MED ORDER — PHENYLEPHRINE HCL 10 MG/ML IJ SOLN
10.0000 mg | INTRAVENOUS | Status: DC | PRN
  Administered 2014-10-09: 30 ug/min via INTRAVENOUS

## 2014-10-09 MED ORDER — ROCURONIUM BROMIDE 50 MG/5ML IV SOLN
INTRAVENOUS | Status: AC
  Filled 2014-10-09: qty 1

## 2014-10-09 MED ORDER — OXYCODONE HCL 5 MG PO TABS
ORAL_TABLET | ORAL | Status: AC
  Filled 2014-10-09: qty 1

## 2014-10-09 MED ORDER — LACTATED RINGERS IV SOLN
INTRAVENOUS | Status: DC | PRN
  Administered 2014-10-09: 07:00:00 via INTRAVENOUS

## 2014-10-09 MED ORDER — GLYCOPYRROLATE 0.2 MG/ML IJ SOLN
INTRAMUSCULAR | Status: AC
  Filled 2014-10-09: qty 3

## 2014-10-09 MED ORDER — BUPIVACAINE 0.5 % ON-Q PUMP SINGLE CATH 400 ML
INJECTION | Status: DC | PRN
  Administered 2014-10-09: 400 mL

## 2014-10-09 MED ORDER — CETYLPYRIDINIUM CHLORIDE 0.05 % MT LIQD
7.0000 mL | Freq: Two times a day (BID) | OROMUCOSAL | Status: DC
  Administered 2014-10-10 – 2014-10-17 (×10): 7 mL via OROMUCOSAL

## 2014-10-09 MED ORDER — POTASSIUM CHLORIDE 10 MEQ/50ML IV SOLN
10.0000 meq | Freq: Every day | INTRAVENOUS | Status: DC | PRN
  Filled 2014-10-09: qty 50

## 2014-10-09 SURGICAL SUPPLY — 87 items
ADH SKN CLS APL DERMABOND .7 (GAUZE/BANDAGES/DRESSINGS) ×1
APL SRG 22X2 LUM MLBL SLNT (VASCULAR PRODUCTS) ×1
APPLICATOR TIP EXT COSEAL (VASCULAR PRODUCTS) ×2 IMPLANT
BAG DECANTER FOR FLEXI CONT (MISCELLANEOUS) IMPLANT
BLADE SURG 11 STRL SS (BLADE) ×5 IMPLANT
CANISTER SUCTION 2500CC (MISCELLANEOUS) ×3 IMPLANT
CATH KIT ON Q 5IN SLV (PAIN MANAGEMENT) ×2 IMPLANT
CATH THORACIC 28FR (CATHETERS) IMPLANT
CATH THORACIC 36FR (CATHETERS) IMPLANT
CATH THORACIC 36FR RT ANG (CATHETERS) IMPLANT
CLIP TI MEDIUM 24 (CLIP) ×2 IMPLANT
CONN Y 3/8X3/8X3/8  BEN (MISCELLANEOUS)
CONN Y 3/8X3/8X3/8 BEN (MISCELLANEOUS) IMPLANT
CONT SPEC 4OZ CLIKSEAL STRL BL (MISCELLANEOUS) ×12 IMPLANT
COVER SURGICAL LIGHT HANDLE (MISCELLANEOUS) ×3 IMPLANT
DERMABOND ADVANCED (GAUZE/BANDAGES/DRESSINGS) ×2
DERMABOND ADVANCED .7 DNX12 (GAUZE/BANDAGES/DRESSINGS) IMPLANT
DRAPE LAPAROSCOPIC ABDOMINAL (DRAPES) ×3 IMPLANT
DRAPE WARM FLUID 44X44 (DRAPE) ×3 IMPLANT
DRSG COVADERM 4X6 (GAUZE/BANDAGES/DRESSINGS) ×2 IMPLANT
DRSG MEPILEX BORDER 4X8 (GAUZE/BANDAGES/DRESSINGS) ×2 IMPLANT
ELECT BLADE 4.0 EZ CLEAN MEGAD (MISCELLANEOUS)
ELECT REM PT RETURN 9FT ADLT (ELECTROSURGICAL) ×3
ELECTRODE BLDE 4.0 EZ CLN MEGD (MISCELLANEOUS) IMPLANT
ELECTRODE REM PT RTRN 9FT ADLT (ELECTROSURGICAL) ×1 IMPLANT
GAUZE SPONGE 4X4 12PLY STRL (GAUZE/BANDAGES/DRESSINGS) ×3 IMPLANT
GLOVE BIO SURGEON STRL SZ7.5 (GLOVE) ×6 IMPLANT
GLOVE BIOGEL PI IND STRL 6 (GLOVE) IMPLANT
GLOVE BIOGEL PI IND STRL 6.5 (GLOVE) IMPLANT
GLOVE BIOGEL PI IND STRL 7.0 (GLOVE) IMPLANT
GLOVE BIOGEL PI INDICATOR 6 (GLOVE) ×2
GLOVE BIOGEL PI INDICATOR 6.5 (GLOVE) ×4
GLOVE BIOGEL PI INDICATOR 7.0 (GLOVE) ×2
GOWN STRL REUS W/ TWL LRG LVL3 (GOWN DISPOSABLE) ×3 IMPLANT
GOWN STRL REUS W/TWL LRG LVL3 (GOWN DISPOSABLE) ×9
HANDLE STAPLE ENDO GIA SHORT (STAPLE) ×2
HEMOSTAT SURGICEL 2X14 (HEMOSTASIS) IMPLANT
KIT BASIN OR (CUSTOM PROCEDURE TRAY) ×3 IMPLANT
KIT ROOM TURNOVER OR (KITS) ×3 IMPLANT
KIT SUCTION CATH 14FR (SUCTIONS) ×3 IMPLANT
NS IRRIG 1000ML POUR BTL (IV SOLUTION) ×8 IMPLANT
PACK CHEST (CUSTOM PROCEDURE TRAY) ×3 IMPLANT
PAD ARMBOARD 7.5X6 YLW CONV (MISCELLANEOUS) ×9 IMPLANT
RELOAD EGIA 60 MED/THCK PURPLE (STAPLE) ×3 IMPLANT
RELOAD STAPLE 60 BLK XTHK ART (STAPLE) IMPLANT
RELOAD STAPLE 60 MED/THCK ART (STAPLE) IMPLANT
RELOAD TRI 2.0 60 XTHK VAS SUL (STAPLE) ×15 IMPLANT
SEALANT PROGEL (MISCELLANEOUS) IMPLANT
SEALANT SURG COSEAL 4ML (VASCULAR PRODUCTS) IMPLANT
SEALANT SURG COSEAL 8ML (VASCULAR PRODUCTS) ×2 IMPLANT
SOLUTION ANTI FOG 6CC (MISCELLANEOUS) ×3 IMPLANT
SPONGE GAUZE 4X4 12PLY STER LF (GAUZE/BANDAGES/DRESSINGS) ×2 IMPLANT
SPONGE INTESTINAL PEANUT (DISPOSABLE) IMPLANT
SPONGE TONSIL 1.25 RF SGL STRG (GAUZE/BANDAGES/DRESSINGS) ×3 IMPLANT
STAPLER ENDO GIA 12 SHRT THIN (STAPLE) ×1 IMPLANT
STAPLER ENDO GIA 12MM SHORT (STAPLE) ×1 IMPLANT
STAPLER TA30 4.8 NON-ABS (STAPLE) IMPLANT
SUT CHROMIC 3 0 SH 27 (SUTURE) IMPLANT
SUT ETHILON 3 0 PS 1 (SUTURE) IMPLANT
SUT PROLENE 3 0 SH DA (SUTURE) IMPLANT
SUT PROLENE 4 0 RB 1 (SUTURE)
SUT PROLENE 4-0 RB1 .5 CRCL 36 (SUTURE) IMPLANT
SUT PROLENE 6 0 C 1 30 (SUTURE) IMPLANT
SUT SILK  1 MH (SUTURE) ×4
SUT SILK 1 MH (SUTURE) ×2 IMPLANT
SUT SILK 1 TIES 10X30 (SUTURE) IMPLANT
SUT SILK 2 0SH CR/8 30 (SUTURE) IMPLANT
SUT SILK 3 0SH CR/8 30 (SUTURE) IMPLANT
SUT VIC AB 1 CTX 18 (SUTURE) ×2 IMPLANT
SUT VIC AB 2 TP1 27 (SUTURE) IMPLANT
SUT VIC AB 2-0 CTX 36 (SUTURE) ×2 IMPLANT
SUT VIC AB 3-0 SH 18 (SUTURE) IMPLANT
SUT VIC AB 3-0 X1 27 (SUTURE) ×2 IMPLANT
SUT VICRYL 0 UR6 27IN ABS (SUTURE) IMPLANT
SUT VICRYL 2 TP 1 (SUTURE) ×2 IMPLANT
SWAB COLLECTION DEVICE MRSA (MISCELLANEOUS) IMPLANT
SYSTEM SAHARA CHEST DRAIN ATS (WOUND CARE) ×3 IMPLANT
TAPE CLOTH SURG 4X10 WHT LF (GAUZE/BANDAGES/DRESSINGS) ×2 IMPLANT
TIP APPLICATOR SPRAY EXTEND 16 (VASCULAR PRODUCTS) IMPLANT
TOWEL OR 17X24 6PK STRL BLUE (TOWEL DISPOSABLE) ×6 IMPLANT
TOWEL OR 17X26 10 PK STRL BLUE (TOWEL DISPOSABLE) ×6 IMPLANT
TRAP SPECIMEN MUCOUS 40CC (MISCELLANEOUS) IMPLANT
TRAY FOLEY CATH 16FRSI W/METER (SET/KITS/TRAYS/PACK) ×3 IMPLANT
TUBE ANAEROBIC SPECIMEN COL (MISCELLANEOUS) IMPLANT
TUNNELER SHEATH ON-Q 11GX8 (MISCELLANEOUS) ×2 IMPLANT
TUNNELER SHEATH ON-Q 11GX8 DSP (PAIN MANAGEMENT) ×3 IMPLANT
WATER STERILE IRR 1000ML POUR (IV SOLUTION) ×6 IMPLANT

## 2014-10-09 NOTE — Transfer of Care (Signed)
Immediate Anesthesia Transfer of Care Note  Patient: Kyle Hamilton  Procedure(s) Performed: Procedure(s): VIDEO ASSISTED THORACOSCOPY (VATS)/ LOBECTOMY (Left)  Patient Location: PACU  Anesthesia Type:General  Level of Consciousness: awake and alert   Airway & Oxygen Therapy: Patient Spontanous Breathing and Patient connected to face mask oxygen  Post-op Assessment: Report given to PACU RN, Post -op Vital signs reviewed and stable, Patient moving all extremities and Patient moving all extremities X 4  Post vital signs: Reviewed and stable  Complications: No apparent anesthesia complications

## 2014-10-09 NOTE — Progress Notes (Signed)
The patient was examined and preop studies reviewed. There has been no change from the prior exam and the patient is ready for surgery.   Plan L VATS on H Salsgiver

## 2014-10-09 NOTE — Progress Notes (Signed)
UR Completed.  Estephanie Hubbs Jane [phone] 11/29/2013  

## 2014-10-09 NOTE — Anesthesia Procedure Notes (Signed)
Procedure Name: Intubation Date/Time: 10/09/2014 7:48 AM Performed by: Shirlyn Goltz Pre-anesthesia Checklist: Patient identified, Emergency Drugs available, Suction available and Patient being monitored Patient Re-evaluated:Patient Re-evaluated prior to inductionOxygen Delivery Method: Circle system utilized Preoxygenation: Pre-oxygenation with 100% oxygen Intubation Type: IV induction Ventilation: Mask ventilation without difficulty and Oral airway inserted - appropriate to patient size Laryngoscope Size: Mac and 4 Grade View: Grade I Endobronchial tube: Left and 39 Fr Number of attempts: 1 Airway Equipment and Method: Stylet Placement Confirmation: ETT inserted through vocal cords under direct vision,  positive ETCO2 and breath sounds checked- equal and bilateral Tube secured with: Tape Dental Injury: Teeth and Oropharynx as per pre-operative assessment

## 2014-10-09 NOTE — Plan of Care (Signed)
Problem: Phase I Progression Outcomes Goal: O2 sats > or equal to 88% with O2 Outcome: Completed/Met Date Met:  10/09/14 Goal: If Diabetic, blood sugar < 150 Outcome: Not Applicable Date Met:  38/75/64 Goal: Initial discharge plan identified Outcome: Completed/Met Date Met:  10/09/14

## 2014-10-09 NOTE — Progress Notes (Signed)
Report given to robin rn as caregiver

## 2014-10-09 NOTE — Brief Op Note (Addendum)
10/09/2014  10:02 AM  PATIENT:  Kyle Hamilton  58 y.o. male  PRE-OPERATIVE DIAGNOSIS:  LEFT LUNG NODULE  POST-OPERATIVE DIAGNOSIS:  LEFT LUNG NODULE  PROCEDURE:  LEFT VIDEO ASSISTED THORACOSCOPY (VATS), WEDGE LlL, LYMPH NODE DISSECTION, ON Q PLACEMENT  SURGEON:  Surgeon(s) and Role:    * Ivin Poot, MD - Primary  PHYSICIAN ASSISTANT: Lars Pinks PA-C  ANESTHESIA:   general  EBL:  Total I/O In: -  Out: 190 [Urine:90; Blood:100]  BLOOD ADMINISTERED:none  DRAINS: 28 and 45 French chest tubes in the left pleural space   LOCAL MEDICATIONS USED:  Bupivacaine    SPECIMEN:    DISPOSITION OF SPECIMEN:  PATHOLOGY. Frozen section consistent with adenocarcinoma. Margins negative.  COUNTS CORRECT:  YES  DICTATION: .Dragon Dictation  PLAN OF CARE: Admit to inpatient   PATIENT DISPOSITION:  PACU - hemodynamically stable.   Delay start of Pharmacological VTE agent (>24hrs) due to surgical blood loss or risk of bleeding: yes

## 2014-10-09 NOTE — Anesthesia Preprocedure Evaluation (Signed)
Anesthesia Evaluation  Patient identified by MRN, date of birth, ID band Patient awake    Reviewed: Allergy & Precautions, H&P , NPO status , Patient's Chart, lab work & pertinent test results  Airway Mallampati: I  TM Distance: >3 FB Neck ROM: Full    Dental  (+) Edentulous Upper, Edentulous Lower   Pulmonary former smoker,  breath sounds clear to auscultation        Cardiovascular hypertension, Rhythm:Regular Rate:Normal     Neuro/Psych    GI/Hepatic   Endo/Other    Renal/GU      Musculoskeletal   Abdominal   Peds  Hematology   Anesthesia Other Findings   Reproductive/Obstetrics                             Anesthesia Physical Anesthesia Plan  ASA: III  Anesthesia Plan: General   Post-op Pain Management:    Induction: Intravenous  Airway Management Planned: Double Lumen EBT  Additional Equipment: Arterial line and CVP  Intra-op Plan:   Post-operative Plan:   Informed Consent: I have reviewed the patients History and Physical, chart, labs and discussed the procedure including the risks, benefits and alternatives for the proposed anesthesia with the patient or authorized representative who has indicated his/her understanding and acceptance.     Plan Discussed with: CRNA and Anesthesiologist  Anesthesia Plan Comments:         Anesthesia Quick Evaluation

## 2014-10-09 NOTE — Anesthesia Postprocedure Evaluation (Signed)
  Anesthesia Post-op Note  Patient: Kyle Hamilton  Procedure(s) Performed: Procedure(s): VIDEO ASSISTED THORACOSCOPY (VATS)/ LOBECTOMY (Left)  Patient Location: PACU  Anesthesia Type:General  Level of Consciousness: awake, alert  and oriented  Airway and Oxygen Therapy: Patient Spontanous Breathing and Patient connected to nasal cannula oxygen  Post-op Pain: mild  Post-op Assessment: Post-op Vital signs reviewed, Patient's Cardiovascular Status Stable, Respiratory Function Stable, Patent Airway and No signs of Nausea or vomiting  Post-op Vital Signs: stable  Last Vitals:  Filed Vitals:   10/09/14 1800  BP: 117/82  Pulse: 59  Temp:   Resp: 15    Complications: No apparent anesthesia complications

## 2014-10-10 ENCOUNTER — Encounter (HOSPITAL_COMMUNITY): Payer: Self-pay | Admitting: Cardiothoracic Surgery

## 2014-10-10 LAB — BASIC METABOLIC PANEL
Anion gap: 12 (ref 5–15)
BUN: 14 mg/dL (ref 6–23)
CALCIUM: 8.2 mg/dL — AB (ref 8.4–10.5)
CO2: 27 mEq/L (ref 19–32)
CREATININE: 0.77 mg/dL (ref 0.50–1.35)
Chloride: 100 mEq/L (ref 96–112)
GFR calc non Af Amer: >90 mL/min (ref >90)
Glucose, Bld: 119 mg/dL — ABNORMAL HIGH (ref 70–99)
Potassium: 3.9 mEq/L (ref 3.7–5.3)
Sodium: 139 mEq/L (ref 137–147)

## 2014-10-10 LAB — CBC
HCT: 40.1 % (ref 39.0–52.0)
Hemoglobin: 13.2 g/dL (ref 13.0–17.0)
MCH: 30 pg (ref 26.0–34.0)
MCHC: 32.9 g/dL (ref 30.0–36.0)
MCV: 91.1 fL (ref 78.0–100.0)
Platelets: 236 10*3/uL (ref 150–400)
RBC: 4.4 MIL/uL (ref 4.22–5.81)
RDW: 13.7 % (ref 11.5–15.5)
WBC: 9.1 10*3/uL (ref 4.0–10.5)

## 2014-10-10 LAB — POCT I-STAT 3, ART BLOOD GAS (G3+)
Acid-Base Excess: 4 mmol/L — ABNORMAL HIGH (ref 0.0–2.0)
Bicarbonate: 31.7 mEq/L — ABNORMAL HIGH (ref 20.0–24.0)
O2 Saturation: 98 %
Patient temperature: 98
TCO2: 33 mmol/L (ref 0–100)
pCO2 arterial: 57.1 mmHg (ref 35.0–45.0)
pH, Arterial: 7.351 (ref 7.350–7.450)
pO2, Arterial: 114 mmHg — ABNORMAL HIGH (ref 80.0–100.0)

## 2014-10-10 LAB — GLUCOSE, CAPILLARY: Glucose-Capillary: 116 mg/dL — ABNORMAL HIGH (ref 70–99)

## 2014-10-10 MED ORDER — PANTOPRAZOLE SODIUM 40 MG PO TBEC: 40.0000 mg | DELAYED_RELEASE_TABLET | Freq: Every day | ORAL | Status: DC

## 2014-10-10 MED ORDER — INSULIN ASPART 100 UNIT/ML ~~LOC~~ SOLN
0.0000 [IU] | Freq: Every day | SUBCUTANEOUS | Status: DC
Start: 2014-10-10 — End: 2014-10-10

## 2014-10-10 MED ORDER — ALUM & MAG HYDROXIDE-SIMETH 200-200-20 MG/5ML PO SUSP
15.0000 mL | ORAL | Status: DC | PRN
  Administered 2014-10-10: 15 mL via ORAL
  Filled 2014-10-10: qty 30

## 2014-10-10 NOTE — Progress Notes (Signed)
1 Day Post-Op Procedure(s) (LRB): VIDEO ASSISTED THORACOSCOPY (VATS)/ LOBECTOMY (Left) Subjective: Pain well controlled  CXR w/o PNTX Min air leak Objective: Vital signs in last 24 hours: Temp:  [97.5 F (36.4 C)-98.1 F (36.7 C)] 98.1 F (36.7 C) (12/02 0820) Pulse Rate:  [49-91] 70 (12/02 0700) Cardiac Rhythm:  [-] Sinus bradycardia (12/02 0400) Resp:  [8-27] 15 (12/02 0700) BP: (104-162)/(67-88) 117/72 mmHg (12/02 0700) SpO2:  [93 %-99 %] 99 % (12/02 0700) Arterial Line BP: (73-153)/(55-86) 104/57 mmHg (12/02 0700) Weight:  [126 lb 5.2 oz (57.3 kg)] 126 lb 5.2 oz (57.3 kg) (12/02 0600)  Hemodynamic parameters for last 24 hours:  sinus brady  Intake/Output from previous day: 12/01 0701 - 12/02 0700 In: 2618 [I.V.:2518; IV Piggyback:100] Out: 1175 [Urine:725; Blood:100; Chest Tube:350] Intake/Output this shift:    Lungs clear  Lab Results:  Recent Labs  10/08/14 1129 10/10/14 0524  WBC 6.4 9.1  HGB 15.6 13.2  HCT 46.4 40.1  PLT 265 236   BMET:  Recent Labs  10/08/14 1129 10/10/14 0524  NA 138 139  K 3.8 3.9  CL 100 100  CO2 23 27  GLUCOSE 89 119*  BUN 12 14  CREATININE 0.61 0.77  CALCIUM 9.4 8.2*    PT/INR:  Recent Labs  10/08/14 1129  LABPROT 13.5  INR 1.02   ABG    Component Value Date/Time   PHART 7.351 10/10/2014 0411   HCO3 31.7* 10/10/2014 0411   TCO2 33 10/10/2014 0411   O2SAT 98.0 10/10/2014 0411   CBG (last 3)   Recent Labs  10/09/14 1440 10/09/14 1754 10/09/14 2315  GLUCAP 97 120* 119*    Assessment/Plan: S/P Procedure(s) (LRB): VIDEO ASSISTED THORACOSCOPY (VATS)/ LOBECTOMY (Left) Plan for transfer to step-down: see transfer orders Leave both chest tubes  LOS: 1 day    VAN TRIGT Hamilton,Kyle Fedorchak 10/10/2014

## 2014-10-10 NOTE — Progress Notes (Signed)
CRITICAL VALUE ALERT  Critical value received:  PCO2  Date of notification:  10/10/14  Time of notification: 0411  Critical value read back:Yes.    Nurse who received alert:  Archie Endo, RN  Consistent with previous values.  Will notify MD upon rounding.  Will continue to monitor.

## 2014-10-10 NOTE — Progress Notes (Signed)
Patient ID: Kyle Hamilton, male   DOB: Dec 27, 1955, 58 y.o.   MRN: [id number] EVENING ROUNDS NOTE :     [address]       [place] [address]             [phone]                 1 Day Post-Op Procedure(s) (LRB): VIDEO ASSISTED THORACOSCOPY (VATS)/ LOBECTOMY (Left)  Total Length of Stay:  LOS: 1 day  BP 113/70 mmHg  Pulse 52  Temp(Src) 97.7 F (36.5 C) (Oral)  Resp 13  Ht 5\' 5"  (1.651 m)  Wt 126 lb 5.2 oz (57.3 kg)  BMI 21.02 kg/m2  SpO2 97%  .Intake/Output      12/02 0701 - 12/03 0700   P.O. 240   I.V. (mL/kg) 217.5 (3.8)   IV Piggyback    Total Intake(mL/kg) 457.5 (8)   Urine (mL/kg/hr) 785 (1.1)   Blood    Chest Tube 120 (0.2)   Total Output 905   Net -447.5         . bupivacaine ON-Q pain pump    . dextrose 5 % and 0.2 % NaCl with KCl 20 mEq 10 mL/hr at 10/10/14 1900     Lab Results  Component Value Date   WBC 9.1 10/10/2014   HGB 13.2 10/10/2014   HCT 40.1 10/10/2014   PLT 236 10/10/2014   GLUCOSE 119* 10/10/2014   ALT 12 10/08/2014   AST 20 10/08/2014   NA 139 10/10/2014   K 3.9 10/10/2014   CL 100 10/10/2014   CREATININE 0.77 10/10/2014   BUN 14 10/10/2014   CO2 27 10/10/2014   INR 1.02 10/08/2014   Stable day Good respiratory effort  Grace Isaac MD  Beeper [phone] Office [phone] 10/10/2014 7:27 PM

## 2014-10-10 NOTE — Op Note (Signed)
NAME:  Kyle Hamilton, Kyle Hamilton NO.:  [id number]  MEDICAL RECORD NO.:  [id number]  LOCATION:  2S13C                        FACILITY:  [facility]  PHYSICIAN:  Ivin Poot, M.D.  DATE OF BIRTH:  [birth year]  DATE OF PROCEDURE:  10/09/2014 DATE OF DISCHARGE:                              OPERATIVE REPORT   OPERATION: 1. Left VATS (video-assisted thoracoscopic surgery) with left mini-     thoracotomy and wedge resection of superior segment, left lower     lobe. 2. Mediastinal lymph node dissection.  SURGEON:  Ivin Poot, M.D.  ASSISTANT:  Lars Pinks, PA-C.  PREOPERATIVE DIAGNOSIS:  An 1.8 cm spiculated nodule superior segment, left lower lobe.  POSTOPERATIVE DIAGNOSIS:  An 1.8 cm spiculated nodule superior segment, left lower lobe.  ANESTHESIA:  General by Dr. Roberts Gaudy.  INDICATIONS:  The patient is a 58 year old male with a heavy smoking history who presents with recent diagnosis of a left upper lobe nodular density an incidental finding.  This was confirmed by CT scan to be spiculated and suspicious for bronchogenic carcinoma.  A subsequent PET scan shows this to have hypermetabolic activity without activity of the mediastinal nodes or other sites.  Head CT scan was negative for metastatic disease.  The patient's PFTs showed severe COPD with FEV1 of 1.2, 30% of predicted.  He was felt to be a candidate for surgical resection, although at increased risk due to his heavy smoking history and COPD.  The procedure was discussed in detail with the patient and his family including the indications, benefits, alternatives, and risks. He understood the risks of surgery to include prolonged air leak, postoperative pneumonia, postoperative ventilator dependence, bleeding, and death.  After reviewing these issues, he demonstrated his understanding and agreed to proceed with surgery under what I felt was an informed consent.  FINDINGS:  The nodular density  appeared to be at the junction between the superior segment of the left lower lobe and the posterior segment of the left upper lobe.  The major fissure was incomplete and was somewhat difficult to identify the exact segment of the tumor.  The tumor was resected with a generous wedge resection with margins around the tumor documented by frozen section to be negative.  Regional lymph nodes were also resected and submitted for final pathology.  OPERATIVE PROCEDURE:  The patient was brought to the operating room and placed supine on the operating table where general anesthesia was induced.  A double-lumen endotracheal tube was placed by the anesthesiologist.  The patient was turned left side up and the left chest was prepped and draped as a sterile field.  A proper time-out was performed.  A small incision was made anterior to the tip of the scapula and the left pleural space was examined with the camera.  The lungs were still hyperinflated.  They were significantly discolored from COPD as well as residual erythema-inflammation from heavy smoking history.  The lungs could not be visualized well and could not be adequately palpated or examined through this small incision, so the incision was extended so I could feel the location of the nodule.  The inferior ligament was taken down.  The  lung had a fissure only at the very basilar portion of the lung.  The nodule appeared to be in the vicinity of the superior segment of the left lower lobe into the posterior segment of the left upper lobe.  Using Endo-GIA stapling devices, the lesion was resected with a generous wedge section.  We also removed 3 regional mediastinal nodes and submitted for Pathology.  The staple lines were coated with a fine layer of biologic adhesion-CoSeal.  I felt that further anatomic resection would be difficult, but unclear exact location of the tumor as the fact that the patient has severe COPD and lung-sparing surgery  would be in his best long-term interest.  We decided to treat this with generous wedge resection and proceeded to place 2 chest tubes, anterior and posterior in left pleural space.  The lung was gently reinflated.  The incision was then closed in layers using #2 Vicryl for the pericostal, interrupted #1 Vicryl for the muscle layers, running 2-0 Vicryl for the subcutaneous, and a running subcuticular Vicryl for the skin.  An On-Q catheter was placed beneath the incision, flushed with 0.5% Marcaine, and connected to a 0.5% Marcaine reservoir and secured to the skin.  Sterile dressings were applied.  The chest tubes were connected with underwater Pleur-Evac system of suction.  The patient was extubated and then returned to the recovery room.     Ivin Poot, M.D.     PV/MEDQ  D:  10/09/2014  T:  10/10/2014  Job:  [id number]

## 2014-10-11 ENCOUNTER — Inpatient Hospital Stay (HOSPITAL_COMMUNITY): Payer: Medicare Other

## 2014-10-11 LAB — CBC
HCT: 38 % — ABNORMAL LOW (ref 39.0–52.0)
HEMOGLOBIN: 12.2 g/dL — AB (ref 13.0–17.0)
MCH: 28.9 pg (ref 26.0–34.0)
MCHC: 32.1 g/dL (ref 30.0–36.0)
MCV: 90 fL (ref 78.0–100.0)
Platelets: 232 10*3/uL (ref 150–400)
RBC: 4.22 MIL/uL (ref 4.22–5.81)
RDW: 13.8 % (ref 11.5–15.5)
WBC: 10.3 10*3/uL (ref 4.0–10.5)

## 2014-10-11 LAB — COMPREHENSIVE METABOLIC PANEL
ALBUMIN: 2.9 g/dL — AB (ref 3.5–5.2)
ALT: 11 U/L (ref 0–53)
ANION GAP: 10 (ref 5–15)
AST: 27 U/L (ref 0–37)
Alkaline Phosphatase: 61 U/L (ref 39–117)
BILIRUBIN TOTAL: 0.4 mg/dL (ref 0.3–1.2)
BUN: 12 mg/dL (ref 6–23)
CALCIUM: 8.6 mg/dL (ref 8.4–10.5)
CO2: 29 mEq/L (ref 19–32)
CREATININE: 0.69 mg/dL (ref 0.50–1.35)
Chloride: 100 mEq/L (ref 96–112)
GFR calc Af Amer: >90 mL/min (ref >90)
GFR calc non Af Amer: >90 mL/min (ref >90)
GLUCOSE: 96 mg/dL (ref 70–99)
POTASSIUM: 3.8 meq/L (ref 3.7–5.3)
SODIUM: 139 meq/L (ref 137–147)
Total Protein: 5.8 g/dL — ABNORMAL LOW (ref 6.0–8.3)

## 2014-10-11 NOTE — Plan of Care (Signed)
Problem: Phase I Progression Outcomes Goal: Chest tube drainage < 200 cc/hr Outcome: Completed/Met Date Met:  10/11/14 Goal: Pain controlled with appropriate interventions Outcome: Not Progressing Patient has a PCA for pain management, but does not use it unless being encouraged by staff. Patient does grimaces to pain very often and display mobility limitation secondary to pain. When asked why he does not use it, patient states "I'm trying not to use it, I don't like using medicines." Provided support and education regarding pain regimen and recovery. Patient does not express any concerns to the pain medications, simply that he does not like to take medicines. After much education, patient verbalizes he will use the PCA when needed. RN also provided PRN pain medicines as patient rated pain level of 8. Will continue to support and educate patient.

## 2014-10-11 NOTE — Plan of Care (Signed)
Problem: Phase I Progression Outcomes Goal: Hemodynamically stable Outcome: Completed/Met Date Met:  10/11/14     

## 2014-10-11 NOTE — Plan of Care (Signed)
Problem: Phase II Progression Outcomes Goal: Incision intact & without signs/symptoms of infection Outcome: Progressing Goal: Weaning O2 for sats > or equal to 88% Outcome: Completed/Met Date Met:  10/11/14 Goal: Tolerating diet Outcome: Completed/Met Date Met:  10/11/14  Problem: Phase III Progression Outcomes Goal: O2 sats > 88% on room air Outcome: Completed/Met Date Met:  10/11/14 Goal: Tolerating diet Outcome: Completed/Met Date Met:  10/11/14 Goal: Transferred to Telemetry Outcome: Completed/Met Date Met:  10/11/14 Patient ambulated all the way to 2W, room 22. VS stable prior and during the transfer. Patient settle in chair. Patient states he will call his family members to let them know of the transfer. Patient's belongings (cellphone & a Games developer) are with him in his new room. Report given to receiving RN Lovey Newcomer. Staff in room prior to RN leaving. Windy Dudek, Therapist, sports.

## 2014-10-11 NOTE — Progress Notes (Signed)
Wasted 4 ml from fentanyl syringe into sink. Witnessed by RN Jake Bathe. Payton Emerald, RN

## 2014-10-11 NOTE — Significant Event (Cosign Needed)
Posterior chest tube removed, sites cleansed, new dressings applied. Patient tolerated procedure well. VS stable.  Anterior CT remain, wall suction at 20cm.

## 2014-10-12 ENCOUNTER — Inpatient Hospital Stay (HOSPITAL_COMMUNITY): Payer: Medicare Other

## 2014-10-12 NOTE — Progress Notes (Signed)
10/12/2014 2:50 PM Nursing note On-q pump d/c per order and per protocol. Occlusive dressing applied.  Othello Sgroi, Arville Lime

## 2014-10-12 NOTE — Discharge Summary (Signed)
[address]       [place] [address]             [phone]              Discharge Summary  Name: Kyle Hamilton DOB: 1956-01-23 58 y.o. MRN: [id number]   Admission Date: 10/09/2014 Discharge Date: 10/17/2014    Admitting Diagnosis: Left lower lobe lung mass   Discharge Diagnosis:  Moderately differentiated invasive adenocarcinoma (T1a, N0)  Past Medical History  Diagnosis Date  . Hypertension   . Hearing loss   . COPD (chronic obstructive pulmonary disease)   . Spinal stenosis   . Shortness of breath dyspnea     on exertion     Procedures: LEFT VIDEO ASSISTED THORACOSCOPY/MINI-THORACOTOMY  - 10/09/2014  LEFT LOWER LOBE WEDGE RESECTION  MEDIASTINAL LYMPH NODE DISSECTION    HPI:  The patient is a 58 y.o. male with a history of tobacco use who quit in July 2015.  He had a chest x-ray earlier this fall at the time of ENT surgery to reimplant a hearing device in his right temporal bone. This demonstrated a possible pulmonary nodule. Chest CT scan was then performed which confirmed a left lower lobe superior segment 1.7 cm nodule. No suspicious mediastinal adenopathy. The patient does have evidence of emphysema associated with his long smoking history. The patient denies any pulmonary symptoms of cough, hemoptysis, weight loss, headache or bone pain. Patient still works part time doing heavy equipment.  He was referred to Dr. Prescott Gum for thoracic surgical evaluation.  A PET scan was performed which showed hypermetabolic activity in the lesions with an SUV of 6.5.  There was no activity in the mediastinal nodes or abdomen.  CT of the brain showed no evidence of metastatic disease.  PFTs were consistent with moderate to severe COPD, with FVC 3.1, FEV1 of 1.4 and diffusion capacity 46% predicted. Dr. Prescott Gum felt he would benefit from wedge resection and possible lobectomy at this time. All risks, benefits and alternatives of surgery were explained  in detail, and the patient agreed to proceed.    Hospital Course:  The patient was admitted to [facility] on 10/09/2014. The patient was taken to the operating room and underwent the above procedure.    The postoperative course has generally been uneventful.  Final pathology revealed moderately differentiated invasive adenocarcinoma (T1a, N0).    His a line and foley were remove early in his post operative course. He remained afebrile and hemodynamically stable.  Chest tube output gradually decreased. He did have a small air leak post op, which did eventually resolve. Daily chest x rays were obtained and remained stable. He was transferred from the ICU to [facility] for further convalescence on 10/11/2014. Chest tubes were placed to water seal on 10/12/2012. Last chest tube was removed 10/15/2014. Chest x ray showed stable, small right apical pneumothorax and improving atelectasis. He has a history of COPD. He developed productive coughing. He was placed on Mucinex, Symbicort, nebs PRN, and Fortaz. Coughing is much less. He will not require antibiotics at discharge. He has been requiring 1-2 liters of oxygen via  and will require home oxygen. He is tolerating a diet and has had a bowel movement. He is felt surgically stable for discharge today.  Recent vital signs:  Filed Vitals:   10/17/14 0409  BP: 114/73  Pulse: 73  Temp: 98.2 F (36.8 C)  Resp: 18    Recent laboratory studies:  CBC: No results for input(s): WBC, HGB, HCT, PLT in the last 72 hours. BMET:  No results for input(s): NA, K, CL, CO2, GLUCOSE, BUN, CREATININE, CALCIUM in the last 72 hours.  PT/INR: No results for input(s): LABPROT, INR in the last 72 hours.   Discharge Medications:     Medication List    TAKE these medications        budesonide-formoterol 160-4.5 MCG/ACT inhaler  Commonly known as:  SYMBICORT  Inhale 2 puffs into the lungs 2 (two) times daily.     guaiFENesin 600 MG 12 hr tablet  Commonly known as:   MUCINEX  Take 1 tablet (600 mg total) by mouth 2 (two) times daily. For cough     hydrochlorothiazide 25 MG tablet  Commonly known as:  HYDRODIURIL  Take 1 tablet (25 mg total) by mouth daily.     oxyCODONE 5 MG immediate release tablet  Commonly known as:  Oxy IR/ROXICODONE  Take 1-2 tablets (5-10 mg total) by mouth every 4 (four) hours as needed for severe pain.         Discharge Instructions:  The patient is to refrain from driving, heavy lifting or strenuous activity.  May shower daily and clean incisions with soap and water.  May resume regular diet.   Follow Up:  Follow-up Information    Follow up with VAN Wilber Oliphant, MD On 11/14/2014.   Specialty:  Cardiothoracic Surgery   Why:  PA/LAT CXR to be taken (at [facility] which is in the same building as Dr. Lucianne Lei Trigt's office) on 11/14/2014 at 1:30 pm;Appointment with Dr. Prescott Gum is at 2:30 pm   Contact information:   [address] [place] [address] [phone]       Follow up with [facility] On 10/23/2014.   Why:  Appointment is at Dr. Lucianne Lei Trigt's office with the NURSE to have chest tube sutures removed. Office will contact you with appointment time.       ZIMMERMAN,DONIELLE M PA-C 10/17/2014, 7:57 AM

## 2014-10-12 NOTE — Progress Notes (Signed)
Medicare Important Message given? YES  (If response is "NO", the following Medicare IM given date fields will be blank)  Date Medicare IM given: 10/12/14 Medicare IM given by:  Dahlia Client [employer]

## 2014-10-12 NOTE — Progress Notes (Addendum)
      [address]       [place] [address]             [phone]       3 Days Post-Op Procedure(s) (LRB): VIDEO ASSISTED THORACOSCOPY (VATS)/ LOBECTOMY (Left)  Subjective: Patient states breathing is "pretty good"  Objective: Vital signs in last 24 hours: Temp:  [97.3 F (36.3 C)-98.6 F (37 C)] 98.3 F (36.8 C) (12/04 0636) Pulse Rate:  [62-81] 71 (12/04 0636) Cardiac Rhythm:  [-] Normal sinus rhythm (12/03 1954) Resp:  [13-24] 20 (12/04 0636) BP: (121-135)/(66-78) 121/71 mmHg (12/04 0636) SpO2:  [89 %-98 %] 98 % (12/04 0636)     Intake/Output from previous day: 12/03 0701 - 12/04 0700 In: 160 [P.O.:120; I.V.:40] Out: 695 [Urine:585; Chest Tube:110]   Physical Exam:  Cardiovascular: RRR Pulmonary: Diminished on right Abdomen: Soft, non tender, bowel sounds present. Extremities: No lower extremity edema. Wounds: Dressing intact Chest Tube: to suction, no air leak  Lab Results: CBC: Recent Labs  10/10/14 0524 10/11/14 0400  WBC 9.1 10.3  HGB 13.2 12.2*  HCT 40.1 38.0*  PLT 236 232   BMET:  Recent Labs  10/10/14 0524 10/11/14 0400  NA 139 139  K 3.9 3.8  CL 100 100  CO2 27 29  GLUCOSE 119* 96  BUN 14 12  CREATININE 0.77 0.69  CALCIUM 8.2* 8.6    PT/INR: No results for input(s): LABPROT, INR in the last 72 hours. ABG:  INR: Will add last result for INR, ABG once components are confirmed Will add last 4 CBG results once components are confirmed  Assessment/Plan:  1. CV - SR 2.  Pulmonary - Chest tubes with 110 cc last 24 hours. Some tidling with cough, but no obvious air leak. As discussed with Dr. Prescott Gum, place chest tube to water seal. Dr. Prescott Gum reviewed CXR-stable. Encourage incentive spirometer 3. Remove On Q 4. Will stop PCA after last chest tube removed   ZIMMERMAN,DONIELLE MPA-C 10/12/2014,7:50 AM CT to water seal PA/LAT in am Path--- stage 1a, nodes are negative  patient examined and medical record  reviewed,agree with above note. VAN TRIGT III,PETER 10/12/2014

## 2014-10-13 ENCOUNTER — Inpatient Hospital Stay (HOSPITAL_COMMUNITY): Payer: Medicare Other

## 2014-10-13 LAB — BASIC METABOLIC PANEL
Anion gap: 10 (ref 5–15)
BUN: 9 mg/dL (ref 6–23)
CALCIUM: 9.2 mg/dL (ref 8.4–10.5)
CO2: 31 mEq/L (ref 19–32)
CREATININE: 0.53 mg/dL (ref 0.50–1.35)
Chloride: 96 mEq/L (ref 96–112)
GFR calc Af Amer: >90 mL/min (ref >90)
GLUCOSE: 113 mg/dL — AB (ref 70–99)
Potassium: 4.2 mEq/L (ref 3.7–5.3)
Sodium: 137 mEq/L (ref 137–147)

## 2014-10-13 LAB — MAGNESIUM: Magnesium: 2 mg/dL (ref 1.5–2.5)

## 2014-10-13 NOTE — Progress Notes (Signed)
Nursing note Encouraged ambulation, Patient declines to get out of bed at this time or to ambulate. Will attempt  Again. Anorah Trias, Bettina Gavia RN

## 2014-10-13 NOTE — Progress Notes (Addendum)
[address]       [place] [address]             [phone]      4 Days Post-Op Procedure(s) (LRB): VIDEO ASSISTED THORACOSCOPY (VATS)/ LOBECTOMY (Left) Subjective: Feels sore, some productive cough, moderate ain but good relief with the PCA  Objective: Vital signs in last 24 hours: Temp:  [98.2 F (36.8 C)-99.3 F (37.4 C)] 98.2 F (36.8 C) (12/05 0500) Pulse Rate:  [77-81] 77 (12/04 2117) Cardiac Rhythm:  [-]  Resp:  [13-22] 13 (12/05 0400) BP: (123-125)/(67-79) 125/69 mmHg (12/05 0500) SpO2:  [92 %-96 %] 92 % (12/05 0500)  Hemodynamic parameters for last 24 hours:    Intake/Output from previous day: 12/04 0701 - 12/05 0700 In: 600 [P.O.:600] Out: 1635 [Urine:1500; Chest Tube:135] Intake/Output this shift:    General appearance: alert, cooperative and no distress Heart: regular rate and rhythm and freq extrasystole Lungs: coarse, fair air exchange, improves some withcough Abdomen: benign Extremities: no edema Wound: dressings CDI  Lab Results:  Recent Labs  10/11/14 0400  WBC 10.3  HGB 12.2*  HCT 38.0*  PLT 232   BMET:  Recent Labs  10/11/14 0400  NA 139  K 3.8  CL 100  CO2 29  GLUCOSE 96  BUN 12  CREATININE 0.69  CALCIUM 8.6    PT/INR: No results for input(s): LABPROT, INR in the last 72 hours. ABG    Component Value Date/Time   PHART 7.351 10/10/2014 0411   HCO3 31.7* 10/10/2014 0411   TCO2 33 10/10/2014 0411   O2SAT 98.0 10/10/2014 0411   CBG (last 3)  No results for input(s): GLUCAP in the last 72 hours.  Meds Scheduled Meds: . acetaminophen  1,000 mg Oral 4 times per day   Or  . acetaminophen (TYLENOL) oral liquid 160 mg/5 mL  1,000 mg Oral 4 times per day  . antiseptic oral rinse  7 mL Mouth Rinse BID  . bisacodyl  10 mg Oral Daily  . fentaNYL   Intravenous 6 times per day  . pantoprazole  40 mg Oral Daily  . senna-docusate  1 tablet Oral QHS   Continuous Infusions: . dextrose 5 % and 0.2 % NaCl  with KCl 20 mEq 10 mL/hr at 10/12/14 1817   PRN Meds:.alum & mag hydroxide-simeth, diphenhydrAMINE **OR** diphenhydrAMINE, naloxone **AND** sodium chloride, ondansetron (ZOFRAN) IV, oxyCODONE, potassium chloride, traMADol  Xrays Dg Chest Port 1 View  10/12/2014   CLINICAL DATA:  Chest tube.  EXAM: PORTABLE CHEST - 1 VIEW  COMPARISON:  10/11/2014 appear  FINDINGS: Right IJ line in stable position. Interval removal of 1 chest tube on the left. Remaining left chest tube is in stable position. Tiny stable left apical pneumothorax noted. Persistent left upper lobe atelectasis. Stable cardiomegaly. Stable left chest wall subcutaneous emphysema.  IMPRESSION: 1. Removal of 1 of the 2 left chest tubes. Remaining chest tube in stable position. Tiny residual left apical pneumothorax, unchanged. 2. Left upper lobe atelectasis.   Electronically Signed   By: Marcello Moores  Register   On: 10/12/2014 07:53   CLINICAL DATA: Left chest tube, status post left VATS  EXAM: CHEST - 2 VIEW  COMPARISON: 10/12/2014  FINDINGS: Stable left chest tube position extending to the apex. Small left apical pneumothorax present, appearing slightly larger today. Postop changes in the left lung with diffuse increased left lung atelectasis/ airspace disease. Stable mild cardiomegaly. Right lung remains clear. No enlarging effusion. Stable right  IJ central line position. Calcified mediastinal nodes noted.  IMPRESSION: Stable left chest tube.  Slight increased small left apical pneumothorax  Slight worsening diffuse atelectasis/airspace disease in the left lung.   Electronically Signed  By: Daryll Brod M.D.  On: 10/13/2014 08:47  Assessment/Plan: S/P Procedure(s) (LRB): VIDEO ASSISTED THORACOSCOPY (VATS)/ LOBECTOMY (Left)  1 unable to see CXR- awaiting result, no air leak, poss d/c chest tube 2 keep pca for now 3 push pulm toilet, wean O2, add flutter valve 4 no new labs, has frequent PVC's- check  K+/MG++ 5 push ambulation as able     LOS: 4 days    GOLD,WAYNE E 10/13/2014   Chart reviewed, patient examined, agree with above. CXR this am shows a slight increase in the small left apical ptx and slight worseing aeration of the left lung. I don't see an airleak. Will keep the chest tube in to water seal today and reevaluate in the am. If no significant change, will DC chest tube tomorrow.

## 2014-10-13 NOTE — Plan of Care (Signed)
Problem: Phase III Progression Outcomes Goal: Incision intact & without signs/symptoms of infection Outcome: Completed/Met Date Met:  10/13/14

## 2014-10-13 NOTE — Plan of Care (Signed)
Problem: Phase III Progression Outcomes Goal: Hemodynamically stable Outcome: Completed/Met Date Met:  10/13/14 Goal: Pain controlled on oral analgesia Outcome: Progressing

## 2014-10-14 ENCOUNTER — Inpatient Hospital Stay (HOSPITAL_COMMUNITY): Payer: Medicare Other

## 2014-10-14 NOTE — Progress Notes (Addendum)
[address]       [place] [address]             [phone]      5 Days Post-Op Procedure(s) (LRB): VIDEO ASSISTED THORACOSCOPY (VATS)/ LOBECTOMY (Left) Subjective: Feels ok, moderate productive cough  Objective: Vital signs in last 24 hours: Temp:  [98.2 F (36.8 C)-98.5 F (36.9 C)] 98.2 F (36.8 C) (12/06 0509) Pulse Rate:  [75-82] 75 (12/05 1959) Cardiac Rhythm:  [-] Normal sinus rhythm (12/05 1952) Resp:  [14-21] 16 (12/06 0509) BP: (114-134)/(71-75) 114/75 mmHg (12/06 0509) SpO2:  [93 %-99 %] 97 % (12/06 0509)  Hemodynamic parameters for last 24 hours:    Intake/Output from previous day: 12/05 0701 - 12/06 0700 In: 220 [P.O.:220] Out: 650 [Urine:550; Chest Tube:100] Intake/Output this shift:    General appearance: alert, cooperative and no distress Heart: regular rate and rhythm Lungs: coarse upper ronchi , improves with cough Abdomen: benign Extremities: no edema Wound: incis healing well  Lab Results: No results for input(s): WBC, HGB, HCT, PLT in the last 72 hours. BMET:  Recent Labs  10/13/14 0959  NA 137  K 4.2  CL 96  CO2 31  GLUCOSE 113*  BUN 9  CREATININE 0.53  CALCIUM 9.2    PT/INR: No results for input(s): LABPROT, INR in the last 72 hours. ABG    Component Value Date/Time   PHART 7.351 10/10/2014 0411   HCO3 31.7* 10/10/2014 0411   TCO2 33 10/10/2014 0411   O2SAT 98.0 10/10/2014 0411   CBG (last 3)  No results for input(s): GLUCAP in the last 72 hours.  Meds Scheduled Meds: . acetaminophen  1,000 mg Oral 4 times per day   Or  . acetaminophen (TYLENOL) oral liquid 160 mg/5 mL  1,000 mg Oral 4 times per day  . antiseptic oral rinse  7 mL Mouth Rinse BID  . bisacodyl  10 mg Oral Daily  . fentaNYL   Intravenous 6 times per day  . pantoprazole  40 mg Oral Daily  . senna-docusate  1 tablet Oral QHS   Continuous Infusions: . dextrose 5 % and 0.2 % NaCl with KCl 20 mEq 10 mL/hr at 10/12/14 1817   PRN  Meds:.alum & mag hydroxide-simeth, diphenhydrAMINE **OR** diphenhydrAMINE, naloxone **AND** sodium chloride, ondansetron (ZOFRAN) IV, oxyCODONE, potassium chloride, traMADol  Xrays Dg Chest 2 View  10/13/2014   CLINICAL DATA:  Left chest tube, status post left VATS  EXAM: CHEST - 2 VIEW  COMPARISON:  10/12/2014  FINDINGS: Stable left chest tube position extending to the apex. Small left apical pneumothorax present, appearing slightly larger today. Postop changes in the left lung with diffuse increased left lung atelectasis/ airspace disease. Stable mild cardiomegaly. Right lung remains clear. No enlarging effusion. Stable right IJ central line position. Calcified mediastinal nodes noted.  IMPRESSION: Stable left chest tube.  Slight increased small left apical pneumothorax  Slight worsening diffuse atelectasis/airspace disease in the left lung.   Electronically Signed   By: Daryll Brod M.D.   On: 10/13/2014 08:47   Dg Chest Port 1 View  10/12/2014   CLINICAL DATA:  Chest tube.  EXAM: PORTABLE CHEST - 1 VIEW  COMPARISON:  10/11/2014 appear  FINDINGS: Right IJ line in stable position. Interval removal of 1 chest tube on the left. Remaining left chest tube is in stable position. Tiny stable left apical pneumothorax noted. Persistent left upper lobe atelectasis. Stable cardiomegaly. Stable left chest wall subcutaneous emphysema.  IMPRESSION: 1. Removal of 1 of the 2 left chest tubes. Remaining chest tube in stable position. Tiny residual left apical pneumothorax, unchanged. 2. Left upper lobe atelectasis.   Electronically Signed   By: Marcello Moores  Register   On: 10/12/2014 07:53    Chest tube- + air leak, intermit., some tidaling in tube, 120 cc out yesterday- chest xray pending  Assessment/Plan: S/P Procedure(s) (LRB): VIDEO ASSISTED THORACOSCOPY (VATS)/ LOBECTOMY (Left)  1 cont tracheobronchitis, cont aggressive pulm toilet/rx- no fevers 2 keep chest tube till xray- air leak appears intermit 3 frequent  pvc's , K+ /MG++ ok, no hx of ischemic heart dz but clinically /historically could be a candidate.- monitor, might tolerate beta blocker- cont to observe 4 push physical rehab as able  LOS: 5 days    GOLD,WAYNE E 10/14/2014   Chart reviewed, patient examined, agree with above. He may still have a small air leak. CXR shows more atelectasis/airspace disease in the left lung. Will keep tube in today to water seal. Work on pulmonary toilet.

## 2014-10-14 NOTE — Plan of Care (Signed)
Problem: Phase III Progression Outcomes Goal: Discharge plan remains appropriate-arrangements made Outcome: Progressing

## 2014-10-14 NOTE — Progress Notes (Signed)
Patient up to chair, callbell within reach will monitor patient. Candas Deemer, Bettina Gavia RN

## 2014-10-14 NOTE — Plan of Care (Signed)
Problem: Discharge Progression Outcomes Goal: Tolerating diet Outcome: Completed/Met Date Met:  10/14/14     

## 2014-10-14 NOTE — Progress Notes (Signed)
Pt educated on the importance of movement and ambulation in relation to progression. Offered pt opportunity to ambulate, pt refused.

## 2014-10-15 ENCOUNTER — Inpatient Hospital Stay (HOSPITAL_COMMUNITY): Payer: Medicare Other

## 2014-10-15 MED ORDER — CEFTAZIDIME 1 G IJ SOLR
1.0000 g | Freq: Three times a day (TID) | INTRAMUSCULAR | Status: DC
  Administered 2014-10-15 – 2014-10-16 (×3): 1 g via INTRAVENOUS
  Filled 2014-10-15 (×5): qty 1

## 2014-10-15 MED ORDER — GUAIFENESIN ER 600 MG PO TB12
600.0000 mg | ORAL_TABLET | Freq: Two times a day (BID) | ORAL | Status: DC
  Administered 2014-10-15 – 2014-10-17 (×5): 600 mg via ORAL
  Filled 2014-10-15 (×6): qty 1

## 2014-10-15 MED ORDER — BUDESONIDE-FORMOTEROL FUMARATE 160-4.5 MCG/ACT IN AERO
2.0000 | INHALATION_SPRAY | Freq: Two times a day (BID) | RESPIRATORY_TRACT | Status: DC
  Administered 2014-10-15 – 2014-10-16 (×4): 2 via RESPIRATORY_TRACT
  Filled 2014-10-15 (×2): qty 6

## 2014-10-15 MED ORDER — LEVALBUTEROL HCL 0.63 MG/3ML IN NEBU: 0.6300 mg | INHALATION_SOLUTION | Freq: Three times a day (TID) | RESPIRATORY_TRACT | Status: DC | PRN

## 2014-10-15 NOTE — Progress Notes (Addendum)
      [address]       [place] [address]             [phone]       6 Days Post-Op Procedure(s) (LRB): VIDEO ASSISTED THORACOSCOPY (VATS)/ LOBECTOMY (Left)  Subjective: Patient states coughing has kept him all up night. He is very emotional this morning. He states his breathing is "ok"  Objective: Vital signs in last 24 hours: Temp:  [98 F (36.7 C)-98.4 F (36.9 C)] 98.4 F (36.9 C) (12/07 0451) Pulse Rate:  [60-91] 60 (12/07 0451) Cardiac Rhythm:  [-] Normal sinus rhythm (12/06 2018) Resp:  [14-21] 15 (12/07 0451) BP: (111-129)/(65-78) 125/65 mmHg (12/07 0451) SpO2:  [93 %-98 %] 98 % (12/07 0451)     Intake/Output from previous day: 12/06 0701 - 12/07 0700 In: 480 [P.O.:480] Out: 1925 [Urine:1875; Chest Tube:50]   Physical Exam:  Cardiovascular: RRR Pulmonary: Coarse breaths sounds bilaterally Abdomen: Soft, non tender, bowel sounds present. Extremities: No lower extremity edema. Wounds: Clean and dry Chest Tube: to water seal, no air leak  Lab Results: CBC:No results for input(s): WBC, HGB, HCT, PLT in the last 72 hours. BMET:   Recent Labs  10/13/14 0959  NA 137  K 4.2  CL 96  CO2 31  GLUCOSE 113*  BUN 9  CREATININE 0.53  CALCIUM 9.2    PT/INR: No results for input(s): LABPROT, INR in the last 72 hours. ABG:  INR: Will add last result for INR, ABG once components are confirmed Will add last 4 CBG results once components are confirmed  Assessment/Plan:  1. CV - SR 2.  Pulmonary - On 1-2 liters of oxygen via [place].Chest tube with 90 cc last 24 hours. Some tidling with cough, but no obvious air leak.CXR not ordered for this am-will order. Reviewed CXR. There is a small,stable right pneumothorax. As discussed with Dr. Prescott Gum, remove chest tube.Encourage incentive spirometer and flutter valve 3. Regarding cough, will give Mucinex. Will consider Tussionex PRN. Patient with COPD history. Will add nebs PRN and a daily inhaler.  Also, per Dr. Prescott Gum, start Tressie Ellis (possible early PNA). 4. Benadryl PRN sleep 5. Will remove PCA once chest tube removed.  ZIMMERMAN,DONIELLE MPA-C 10/15/2014,7:23 AM  patient examined and medical record reviewed,agree with above note. VAN TRIGT III,PETER 10/15/2014

## 2014-10-15 NOTE — Progress Notes (Signed)
Patient ambulated in hallway 250 feet with walker. Slightly short of breath back in chair call bell with in reach. Pt was 1x assist. Tomio Kirk, Bettina Gavia rN

## 2014-10-15 NOTE — Progress Notes (Signed)
CARE MANAGEMENT NOTE 10/15/2014  Patient:  Kyle [place], Kyle [place]   Account Number:  [id number]  Date Initiated:  10/09/2014  Documentation initiated by:  [facility]  Subjective/Objective Assessment:   Post op VATS for nodule     Action/Plan:   Anticipated DC Date:  10/15/2014   Anticipated DC Plan:  [place]  CM consult      Choice offered to / List presented to:     DME arranged  [facility]      DME agency  [place].        Status of service:  Completed, signed off Medicare Important Message given?  YES (If response is "NO", the following Medicare IM given date fields will be blank) Date Medicare IM given:  10/12/2014 Medicare IM given by:  Marvetta Gibbons Date Additional Medicare IM given:  10/15/2014 Additional Medicare IM given by:  Jonnie Finner  Discharge Disposition:  HOME/SELF CARE  Per UR Regulation:  Reviewed for med. necessity/level of care/duration of stay  If discussed at [place] of Stay Meetings, dates discussed:    Comments:  10/15/2014 1515 NCM spoke to pt and requesting RW for home. [place] notified for RW.  Jonnie Finner RN CCM Case Mgmt phone [phone]   Contact:  Sabourin,Carolyn Mother [phone]  [phone]

## 2014-10-15 NOTE — Progress Notes (Signed)
Nursing note pateint up to chair. Will monitor . Collyn Ribas, Bettina Gavia RN

## 2014-10-15 NOTE — Progress Notes (Signed)
Nursing note Chest tube removed as ordered. And per protocol, will monitor patient. Kristilyn Coltrane, Bettina Gavia RN

## 2014-10-15 NOTE — Plan of Care (Signed)
Problem: Phase III Progression Outcomes Goal: Completes ADLs with minimal assist Outcome: Progressing

## 2014-10-15 NOTE — Progress Notes (Signed)
PCA dcd as ordered, 69mcg/5ml of Fentanyl wasted in shink/sharps, 2 RN verified with Berenice Primas RN . Letricia Krinsky, Bettina Gavia RN

## 2014-10-15 NOTE — Progress Notes (Signed)
Pt refused to ambulate tonight.  Education reinforced.  Pt resting in bed with call bell in reach.  Will continue to monitor.   

## 2014-10-16 ENCOUNTER — Inpatient Hospital Stay (HOSPITAL_COMMUNITY): Payer: Medicare Other

## 2014-10-16 NOTE — Progress Notes (Addendum)
      [address]       [place] [address]             [phone]       7 Days Post-Op Procedure(s) (LRB): VIDEO ASSISTED THORACOSCOPY (VATS)/ LOBECTOMY (Left)  Subjective: Patient states coughing less.  Objective: Vital signs in last 24 hours: Temp:  [98.1 F (36.7 C)-98.8 F (37.1 C)] 98.5 F (36.9 C) (12/08 0418) Pulse Rate:  [65-85] 65 (12/08 0418) Cardiac Rhythm:  [-] Normal sinus rhythm (12/07 2014) Resp:  [14-17] 17 (12/08 0418) BP: (108-117)/(38-75) 117/75 mmHg (12/08 0418) SpO2:  [91 %-93 %] 93 % (12/08 0418)     Intake/Output from previous day: 12/07 0701 - 12/08 0700 In: -  Out: 1224 [Urine:1725]   Physical Exam:  Cardiovascular: RRR Pulmonary: Diminished breath sounds left apex Abdomen: Soft, non tender, bowel sounds present. Extremities: No lower extremity edema. Wounds: Clean and dry   Lab Results: CBC:No results for input(s): WBC, HGB, HCT, PLT in the last 72 hours. BMET:   Recent Labs  10/13/14 0959  NA 137  K 4.2  CL 96  CO2 31  GLUCOSE 113*  BUN 9  CREATININE 0.53  CALCIUM 9.2    PT/INR: No results for input(s): LABPROT, INR in the last 72 hours. ABG:  INR: Will add last result for INR, ABG once components are confirmed Will add last 4 CBG results once components are confirmed  Assessment/Plan:  1. CV - SR 2.  Pulmonary - On 1-2 liters of oxygen via Kyle [place]. Will likely need oxygen at discharge. Chest tube removed yesterday. CXR appears to show small left apical pneumothorax, gaseous distention of colon.Encourage incentive spirometer and flutter valve.  Coughing is improved with Mucinex. . Patient with COPD history. Continunebs PRN and  a daily inhaler. Also, per Dr. Prescott Gum, he was started on Fortaz. 3. Remove central line and peripheral 4. Likely discharge in am  ZIMMERMAN,Kyle [place] 10/16/2014,7:39 AM   Poss home in am, will need 02 on discharge I have seen and examined Kyle Hamilton and agree with the  above assessment  and plan.  Grace Isaac MD [facility] [phone] Office [phone] 10/16/2014 10:02 AM

## 2014-10-16 NOTE — Plan of Care (Signed)
Problem: Phase I Progression Outcomes Goal: Pain controlled with appropriate interventions Outcome: Completed/Met Date Met:  10/16/14 Goal: Activity tolerated as ordered Outcome: Completed/Met Date Met:  10/16/14

## 2014-10-16 NOTE — Progress Notes (Signed)
Utilization review completed.  

## 2014-10-16 NOTE — Progress Notes (Signed)
IJ removed per MD order and protocol. Pt tolerated procedure well. Occlusive dressing placed. Pt resting in bed with call bell within reach and bed alarm on.

## 2014-10-17 ENCOUNTER — Inpatient Hospital Stay (HOSPITAL_COMMUNITY): Payer: Medicare Other

## 2014-10-17 MED ORDER — BUDESONIDE-FORMOTEROL FUMARATE 160-4.5 MCG/ACT IN AERO: 2.0000 | INHALATION_SPRAY | Freq: Two times a day (BID) | RESPIRATORY_TRACT | Status: DC

## 2014-10-17 MED ORDER — OXYCODONE HCL 5 MG PO TABS: 5.0000 mg | ORAL_TABLET | ORAL | Status: DC | PRN

## 2014-10-17 MED ORDER — GUAIFENESIN ER 600 MG PO TB12: 600.0000 mg | ORAL_TABLET | Freq: Two times a day (BID) | ORAL | Status: DC

## 2014-10-17 NOTE — Discharge Instructions (Signed)
Thoracotomy Thoracotomy is surgery on the chest using a cut (incision) to get access to the organs inside. This type of surgery is often used to repair or remove diseased or damaged tissue in the chest. LET [facility] CARE PROVIDER KNOW ABOUT:  Any allergies you have.  All medicines you are taking, including vitamins, herbs, eye drops, creams, and over-the-counter medicines.  Previous problems you or members of your family have had with the use of anesthetics.  Any blood disorders you have.  Previous surgeries you have had.  Medical conditions you have. RISKS AND COMPLICATIONS Generally, this is a safe procedure. However, as with any procedure, problems can occur. Possible problems include:  Respiratory failure.  Severe bleeding (hemorrhage).  Nerve injury.  Abnormal heart rhythm (arrhythmia).  Heart attack (myocardial infarction).  Blocked blood vessel (embolism).  Infection.  The chest tubes used for drainage after thoracic surgery may cause a buildup of fluid or air in the area around the lungs (pleural space). Both of these conditions can lead to lung collapse, trouble breathing, or other problems. BEFORE THE PROCEDURE  If you are not having emergency surgery, a complete medical history and exam will be done before surgery.  If you are a smoker, quit. This improves recovery and decreases problems.  Other tests may be done before the surgery. Your health care provider can explain these tests to you. Tests may include:  X-ray exams.  MRI.  Sputum culture.  CT.  Blood gas analysis.  Lung (pulmonary) function tests.  Electrocardiography.  Endoscopy.  Pulmonary angiography.  Take all medicines as directed by your health care provider. Do not stop medicines or adjust dosages on your own.  Ask your health care provider about changing or stopping your regular medicines. This is especially important if you are taking diabetes medicines or blood thinners.  Do  not eat or drink anything after midnight on the night before the procedure or as directed by your health care provider. PROCEDURE   An IV tube will be inserted into one of your veins and used to give fluids and medicine. You will be given medicine that makes you sleep (general anesthetic). You may also be given medicine to help you relax (sedative) before surgery.  A tube will be placed down your throat to help you breathe.  Your chest will be cut open to expose the inside of your chest.  The procedure differs depending on what is wrong and what needs to be done.  Following the procedure, a chest tube is inserted between your ribs to drain the wound and expand your lung.  The wound is then closed with stitches or staples. AFTER THE PROCEDURE  You will be taken to a recovery room and watched closely. Depending on the procedure performed, your breathing tube may be removed, or you may need to stay in the intensive care unit with the breathing tube still in place. Ask your health care provider what to expect.  Patients usually have moderate to severe pain following this surgery. It is normal to be sore for a couple weeks following surgery. Pain relieving medicines will be given to keep you comfortable.  Chest tubes are watched closely for signs of fluid or air buildup in the lungs. Your health care provider will remove them when it is safe. Document Released: 07/25/2003 Document Revised: 03/12/2014 Document Reviewed: 06/14/2013 [facility] Patient Information 2015 [place], [place]. This information is not intended to replace advice given to you by your health care provider. Make sure you  discuss any questions you have with your health care provider. ° °

## 2014-10-17 NOTE — Progress Notes (Signed)
SATURATION QUALIFICATIONS: (This note is used to comply with regulatory documentation for home oxygen)  Patient Saturations on Room Air at Rest =96    Patient Saturations on Room Air while Ambulating = 80-84  Patient Saturations on 1 Liters of oxygen while Ambulating = 94 Please briefly explain why patient needs home oxygen: Patient desats while ambulating on room air

## 2014-10-17 NOTE — Plan of Care (Signed)
Problem: Phase III Progression Outcomes Goal: Pain controlled on oral analgesia Outcome: Completed/Met Date Met:  10/17/14

## 2014-10-17 NOTE — Plan of Care (Signed)
Problem: Phase III Progression Outcomes Goal: Ambulates with dyspnea controlled Outcome: Progressing

## 2014-10-17 NOTE — Progress Notes (Addendum)
      [address]       [place] [address]             [phone]       8 Days Post-Op Procedure(s) (LRB): VIDEO ASSISTED THORACOSCOPY (VATS)/ LOBECTOMY (Left)  Subjective: Patient states coughing less and feeling better  Objective: Vital signs in last 24 hours: Temp:  [98.2 F (36.8 C)-98.8 F (37.1 C)] 98.2 F (36.8 C) (12/09 0409) Pulse Rate:  [69-78] 73 (12/09 0409) Cardiac Rhythm:  [-] Normal sinus rhythm (12/08 2059) Resp:  [17-18] 18 (12/09 0409) BP: (108-122)/(66-78) 114/73 mmHg (12/09 0409) SpO2:  [93 %-95 %] 93 % (12/09 0409)    Intake/Output from previous day: 12/08 0701 - 12/09 0700 In: -  Out: 725 [Urine:725]   Physical Exam:  Cardiovascular: RRR Pulmonary: Slightly diminished breath sounds left apex Abdomen: Soft, non tender, bowel sounds present. Extremities: No lower extremity edema. Wounds: Clean, dry, and no sign of infection. Ecchymosis around incision.  Lab Results: CBC:No results for input(s): WBC, HGB, HCT, PLT in the last 72 hours. BMET:  No results for input(s): NA, K, CL, CO2, GLUCOSE, BUN, CREATININE, CALCIUM in the last 72 hours.  PT/INR: No results for input(s): LABPROT, INR in the last 72 hours. ABG:  INR: Will add last result for INR, ABG once components are confirmed Will add last 4 CBG results once components are confirmed  Assessment/Plan:  1. CV - SR 2.  Pulmonary - Was on 1-2 liters of oxygen via [place], but on room air this am. Will  ambulate as he will likely need oxygen at discharge. CXR appears to show stable,small left apical pneumothorax, gaseous distention of colon.Encourage incentive spirometer and flutter valve.  Coughing is improved with Mucinex. . Patient with COPD history. Continue nebs PRN and  a daily inhaler. Also, per Dr. Prescott Gum, he was started on Fortaz. 3. Discharge   ZIMMERMAN,DONIELLE MPA-C 10/17/2014,7:40 AM

## 2014-10-17 NOTE — Care Management Note (Signed)
    Page 1 of 1   10/17/2014     3:12:25 PM CARE MANAGEMENT NOTE 10/17/2014  Patient:  Kyle [place], Kyle Hamilton   Account Number:  [id number]  Date Initiated:  10/09/2014  Documentation initiated by:  Luz Lex  Subjective/Objective Assessment:   Post op VATS for nodule     Action/Plan:   Anticipated DC Date:  10/17/2014   Anticipated DC Plan:  [place]  CM consult      Choice offered to / List presented to:     DME arranged  [place]      DME agency  [place].        Status of service:  Completed, signed off Medicare Important Message given?  YES (If response is "NO", the following Medicare IM given date fields will be blank) Date Medicare IM given:  10/12/2014 Medicare IM given by:  Marvetta Gibbons Date Additional Medicare IM given:  10/15/2014 Additional Medicare IM given by:  Jonnie Finner  Discharge Disposition:  HOME/SELF CARE  Per UR Regulation:  Reviewed for med. necessity/level of care/duration of stay  If discussed at [place] of Stay Meetings, dates discussed:    Comments:  10/17/14- 47- Marvetta Gibbons RN, BSN [phone] Pt for d/c home today will need home 02 and RW- spoke with Jermaine with [facility]- regarding DME needs- to bring equipment to room prior to discharge.  10/15/2014 1515 NCM spoke to pt and requesting RW for home. [place] notified for RW.  Jonnie Finner RN CCM Case Mgmt phone [phone]   Contact:  Mcquiston,Carolyn Mother [phone]  [phone]

## 2014-10-17 NOTE — Progress Notes (Signed)
Patient discharged to home with mother.  Discharge instructions reviewed patient and he states no question.  Prescriptions and home O2 provided to patient.  Patient without complaint.  Telemetry D/C'd.  Patient transported to discharge via wheelchair by the nurse tech.

## 2014-10-22 ENCOUNTER — Other Ambulatory Visit: Payer: Self-pay | Admitting: *Deleted

## 2014-10-22 ENCOUNTER — Encounter (HOSPITAL_COMMUNITY): Payer: Self-pay

## 2014-10-22 DIAGNOSIS — G8918 Other acute postprocedural pain: Secondary | ICD-10-CM

## 2014-10-22 MED ORDER — OXYCODONE HCL 5 MG PO TABS: 5.0000 mg | ORAL_TABLET | ORAL | Status: DC | PRN

## 2014-10-22 NOTE — Telephone Encounter (Signed)
Mr. Tanney needs a pain med refill for Oxycodone s/p VATS Lobectomy 10/09/14. I informed him that a new script would be available for pick up today at our office and he agreed.

## 2014-10-23 ENCOUNTER — Encounter (INDEPENDENT_AMBULATORY_CARE_PROVIDER_SITE_OTHER): Payer: Self-pay

## 2014-10-23 DIAGNOSIS — R918 Other nonspecific abnormal finding of lung field: Secondary | ICD-10-CM

## 2014-10-29 ENCOUNTER — Other Ambulatory Visit: Payer: Self-pay | Admitting: *Deleted

## 2014-10-29 DIAGNOSIS — G8918 Other acute postprocedural pain: Secondary | ICD-10-CM

## 2014-10-29 MED ORDER — OXYCODONE HCL 5 MG PO TABS: 5.0000 mg | ORAL_TABLET | ORAL | Status: DC | PRN

## 2014-11-13 ENCOUNTER — Other Ambulatory Visit: Payer: Self-pay | Admitting: Cardiothoracic Surgery

## 2014-11-13 DIAGNOSIS — R918 Other nonspecific abnormal finding of lung field: Secondary | ICD-10-CM

## 2014-11-14 ENCOUNTER — Ambulatory Visit
Admission: RE | Admit: 2014-11-14 | Discharge: 2014-11-14 | Disposition: A | Discharge status: Home / Self Care | Payer: Medicare Other | Source: Ambulatory Visit | Attending: Cardiothoracic Surgery | Admitting: Cardiothoracic Surgery

## 2014-11-14 ENCOUNTER — Encounter: Payer: Self-pay | Admitting: Cardiothoracic Surgery

## 2014-11-14 ENCOUNTER — Ambulatory Visit (INDEPENDENT_AMBULATORY_CARE_PROVIDER_SITE_OTHER): Payer: Self-pay | Admitting: Cardiothoracic Surgery

## 2014-11-14 VITALS — BP 102/76 | HR 60 | Resp 20 | Ht 65.0 in | Wt 125.0 lb

## 2014-11-14 DIAGNOSIS — Z8709 Personal history of other diseases of the respiratory system: Secondary | ICD-10-CM

## 2014-11-14 DIAGNOSIS — R918 Other nonspecific abnormal finding of lung field: Secondary | ICD-10-CM

## 2014-11-14 DIAGNOSIS — Z87898 Personal history of other specified conditions: Secondary | ICD-10-CM

## 2014-11-14 DIAGNOSIS — J948 Other specified pleural conditions: Secondary | ICD-10-CM | POA: Diagnosis not present

## 2014-11-14 NOTE — Progress Notes (Signed)
PCP is NNODI, Doreene Burke, MD Referring Provider is Izora Gala, MD  Chief Complaint  Patient presents with  . 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 Clinical stage IA.  The patient is doing well. He denies smoking. Is not requiring narcotic. Denies shortness of breath   Past Medical History  Diagnosis Date  . Hypertension   . Hearing loss   . COPD (chronic obstructive pulmonary disease)   . Spinal stenosis   . Shortness of breath dyspnea     on exertion    Past Surgical History  Procedure Laterality Date  . Leg surgery      right leg deep cut  . Stapedes surgery      bilateral  . Bone anchored hearing aid implant  08/03/2012    Procedure: BONE ANCHORED HEARING AID (BAHA) IMPLANT;  Surgeon: Izora Gala, MD;  Location: [place];  Service: ENT;  Laterality: Right;  . Baha revision Right 08/30/2014    Procedure: BONE ANCHORED HEARING AID (BAHA) REVISION RIGHT SIDE;  Surgeon: Izora Gala, MD;  Location: [place];  Service: ENT;  Laterality: Right;  . Video assisted thoracoscopy (vats)/ lobectomy Left 10/09/2014    Procedure: VIDEO ASSISTED THORACOSCOPY (VATS)/ LOBECTOMY;  Surgeon: Ivin Poot, MD;  Location: [place];  Service: Thoracic;  Laterality: Left;    No family history on file.  Social History History  Substance Use Topics  . Smoking status: Former Smoker -- 1.00 packs/day for 35 years    Types: Cigarettes    Quit date: 05/11/2014  . Smokeless tobacco: Not on file  . Alcohol Use: No    Current Outpatient Prescriptions  Medication Sig Dispense Refill  . budesonide-formoterol (SYMBICORT) 160-4.5 MCG/ACT inhaler Inhale 2 puffs into the lungs 2 (two) times daily. 1 Inhaler 1  . guaiFENesin (MUCINEX) 600 MG 12 hr tablet Take 1 tablet (600 mg total) by mouth 2 (two)  times daily. For cough    . hydrochlorothiazide (HYDRODIURIL) 25 MG tablet Take 1 tablet (25 mg total) by mouth daily. 30 tablet 2   No current facility-administered medications for this visit.    No Known Allergies  Review of Systemsimproved appetite and overall strength Incision healed well, chest tube sutures had been removed  BP 102/76 mmHg  Pulse 60  Resp 20  Ht '5\' 5"'  (1.651 m)  Wt 125 lb (56.7 kg)  BMI 20.80 kg/m2  SpO2 97% Physical Exam Alert and comfortable Lungs clear bilaterally Chest incision well-healed on left Heart rhythm regular without murmur or rub  Diagnostic Tests: Chest x-ray reviewed Postoperative surgical changes from wedge resection are present. Postop pneumothorax is resolved.    Impression: Doing well. May resume normal activities.  Plan:return in 2 weeks with followup chest x-ray. Patient will get chest CT scan 6 months postop then every year.

## 2015-01-14 ENCOUNTER — Other Ambulatory Visit: Payer: Self-pay | Admitting: Cardiothoracic Surgery

## 2015-01-14 DIAGNOSIS — R918 Other nonspecific abnormal finding of lung field: Secondary | ICD-10-CM

## 2015-01-16 ENCOUNTER — Other Ambulatory Visit: Payer: Self-pay | Admitting: *Deleted

## 2015-01-16 ENCOUNTER — Ambulatory Visit (INDEPENDENT_AMBULATORY_CARE_PROVIDER_SITE_OTHER): Payer: Medicare Other | Admitting: Cardiothoracic Surgery

## 2015-01-16 ENCOUNTER — Ambulatory Visit
Admission: RE | Admit: 2015-01-16 | Discharge: 2015-01-16 | Disposition: A | Discharge status: Home / Self Care | Payer: Medicare Other | Source: Ambulatory Visit | Attending: Cardiothoracic Surgery | Admitting: Cardiothoracic Surgery

## 2015-01-16 ENCOUNTER — Encounter: Payer: Self-pay | Admitting: Cardiothoracic Surgery

## 2015-01-16 VITALS — BP 121/80 | HR 58 | Resp 16 | Ht 65.0 in | Wt 135.0 lb

## 2015-01-16 DIAGNOSIS — C3412 Malignant neoplasm of upper lobe, left bronchus or lung: Secondary | ICD-10-CM | POA: Diagnosis not present

## 2015-01-16 DIAGNOSIS — R0602 Shortness of breath: Secondary | ICD-10-CM | POA: Diagnosis not present

## 2015-01-16 DIAGNOSIS — Z9889 Other specified postprocedural states: Secondary | ICD-10-CM | POA: Diagnosis not present

## 2015-01-16 DIAGNOSIS — R918 Other nonspecific abnormal finding of lung field: Secondary | ICD-10-CM

## 2015-01-16 DIAGNOSIS — D381 Neoplasm of uncertain behavior of trachea, bronchus and lung: Secondary | ICD-10-CM | POA: Diagnosis not present

## 2015-01-16 DIAGNOSIS — R05 Cough: Secondary | ICD-10-CM | POA: Diagnosis not present

## 2015-01-16 NOTE — Progress Notes (Signed)
PCP is NNODI, Doreene Burke, MD Referring Provider is Izora Gala, MD  Chief Complaint  Patient presents with  . 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 The patient is a nonsmoker. He denies postthoracotomy pain. He denies shortness of breath. He has severe COPD. He had an episode of bronchitis last month with a cough which is resolved.  Chest x-ray taken today shows clear lungs with reduction postoperative changes in the left upper lung field. No new lesions noted.   Past Medical History  Diagnosis Date  . Hypertension   . Hearing loss   . COPD (chronic obstructive pulmonary disease)   . Spinal stenosis   . Shortness of breath dyspnea     on exertion    Past Surgical History  Procedure Laterality Date  . Leg surgery      right leg deep cut  . Stapedes surgery      bilateral  . Bone anchored hearing aid implant  08/03/2012    Procedure: BONE ANCHORED HEARING AID (BAHA) IMPLANT;  Surgeon: Izora Gala, MD;  Location: [place];  Service: ENT;  Laterality: Right;  . Baha revision Right 08/30/2014    Procedure: BONE ANCHORED HEARING AID (BAHA) REVISION RIGHT SIDE;  Surgeon: Izora Gala, MD;  Location: [place];  Service: ENT;  Laterality: Right;  . Video assisted thoracoscopy (vats)/ lobectomy Left 10/09/2014    Procedure: VIDEO ASSISTED THORACOSCOPY (VATS)/ LOBECTOMY;  Surgeon: Ivin Poot, MD;  Location: [place];  Service: Thoracic;  Laterality: Left;    No family history on file.  Social History History  Substance Use Topics  . Smoking status: Former Smoker -- 1.00 packs/day for 35 years    Types: Cigarettes    Quit date: 05/11/2014  . Smokeless tobacco: Not on file  . Alcohol Use: No    Current Outpatient Prescriptions  Medication Sig Dispense Refill  . budesonide-formoterol (SYMBICORT) 160-4.5 MCG/ACT inhaler Inhale 2 puffs into the lungs 2  (two) times daily. 1 Inhaler 1  . guaiFENesin (MUCINEX) 600 MG 12 hr tablet Take 1 tablet (600 mg total) by mouth 2 (two) times daily. For cough    . hydrochlorothiazide (HYDRODIURIL) 25 MG tablet Take 1 tablet (25 mg total) by mouth daily. 30 tablet 2   No current facility-administered medications for this visit.    No Known Allergies  Review of Systems  Improved energy and activity tolerance No weight loss No fever Nonsmoking   BP 121/80 mmHg  Pulse 58  Resp 16  Ht 5\' 5"  (1.651 m)  Wt 135 lb (61.236 kg)  BMI 22.47 kg/m2  SpO2 97% Physical Exam Alert and comfortable Lungs clear VATS incision well-healed Heart rate regular Neuro intact Neck without adenopathy or JVD  Diagnostic Tests: Chest x-ray clear improved postoperative changes left upper lung field  Impression: Stable course 3 months after segmentectomy superior segment left lower lobe  Plan: Returns with CT scan of chest in 6 months for postop surveillance.  [name], MD [facility] [phone]

## 2015-06-18 DIAGNOSIS — H906 Mixed conductive and sensorineural hearing loss, bilateral: Secondary | ICD-10-CM | POA: Diagnosis not present

## 2015-06-27 ENCOUNTER — Other Ambulatory Visit: Payer: Self-pay | Admitting: *Deleted

## 2015-06-27 DIAGNOSIS — C3432 Malignant neoplasm of lower lobe, left bronchus or lung: Secondary | ICD-10-CM

## 2015-07-11 DIAGNOSIS — R972 Elevated prostate specific antigen [PSA]: Secondary | ICD-10-CM | POA: Diagnosis not present

## 2015-07-11 DIAGNOSIS — Z23 Encounter for immunization: Secondary | ICD-10-CM | POA: Diagnosis not present

## 2015-07-11 DIAGNOSIS — J449 Chronic obstructive pulmonary disease, unspecified: Secondary | ICD-10-CM | POA: Diagnosis not present

## 2015-07-11 DIAGNOSIS — I10 Essential (primary) hypertension: Secondary | ICD-10-CM | POA: Diagnosis not present

## 2015-07-11 DIAGNOSIS — R634 Abnormal weight loss: Secondary | ICD-10-CM | POA: Diagnosis not present

## 2015-07-11 DIAGNOSIS — C3432 Malignant neoplasm of lower lobe, left bronchus or lung: Secondary | ICD-10-CM | POA: Diagnosis not present

## 2015-07-11 DIAGNOSIS — E559 Vitamin D deficiency, unspecified: Secondary | ICD-10-CM | POA: Diagnosis not present

## 2015-07-11 DIAGNOSIS — R5383 Other fatigue: Secondary | ICD-10-CM | POA: Diagnosis not present

## 2015-07-24 ENCOUNTER — Ambulatory Visit
Admission: RE | Admit: 2015-07-24 | Discharge: 2015-07-24 | Disposition: A | Discharge status: Home / Self Care | Payer: Medicare Other | Source: Ambulatory Visit | Attending: Cardiothoracic Surgery | Admitting: Cardiothoracic Surgery

## 2015-07-24 ENCOUNTER — Encounter: Payer: Medicare Other | Admitting: Cardiothoracic Surgery

## 2015-07-24 DIAGNOSIS — C3432 Malignant neoplasm of lower lobe, left bronchus or lung: Secondary | ICD-10-CM | POA: Diagnosis not present

## 2015-07-24 MED ORDER — IOPAMIDOL (ISOVUE-300) INJECTION 61%
75.0000 mL | Freq: Once | INTRAVENOUS | Status: AC | PRN
  Administered 2015-07-24: 75 mL via INTRAVENOUS

## 2015-07-25 ENCOUNTER — Encounter: Payer: Medicare Other | Admitting: Cardiothoracic Surgery

## 2015-07-25 ENCOUNTER — Encounter: Payer: Self-pay | Admitting: Cardiothoracic Surgery

## 2015-07-25 NOTE — Progress Notes (Signed)
This encounter was created in error - please disregard.

## 2015-07-26 ENCOUNTER — Telehealth: Payer: Self-pay | Admitting: *Deleted

## 2015-08-14 ENCOUNTER — Ambulatory Visit (INDEPENDENT_AMBULATORY_CARE_PROVIDER_SITE_OTHER): Payer: Medicare Other | Admitting: Cardiothoracic Surgery

## 2015-08-14 ENCOUNTER — Encounter: Payer: Self-pay | Admitting: Cardiothoracic Surgery

## 2015-08-14 VITALS — BP 112/69 | HR 78 | Resp 20 | Ht 65.0 in | Wt 135.0 lb

## 2015-08-14 DIAGNOSIS — C3412 Malignant neoplasm of upper lobe, left bronchus or lung: Secondary | ICD-10-CM | POA: Diagnosis not present

## 2015-08-14 NOTE — Progress Notes (Signed)
PCP is NNODI, Doreene Burke, MD Referring Provider is Izora Gala, MD  Chief Complaint  Patient presents with  . 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 The patient stopped smoking at that time. He denies any pulmonary problems. He has had some mild weight loss which is being evaluated by his primary care physician.  CT scan of the chest shows no new lesions and no evidence recurrence, no abnormal adenopathy. No evidence recurrent cancer. There is some postoperative changes in the left upper lobe.   Past Medical History  Diagnosis Date  . Hypertension   . Hearing loss   . COPD (chronic obstructive pulmonary disease) ([place])   . Spinal stenosis   . Shortness of breath dyspnea     on exertion    Past Surgical History  Procedure Laterality Date  . Leg surgery      right leg deep cut  . Stapedes surgery      bilateral  . Bone anchored hearing aid implant  08/03/2012    Procedure: BONE ANCHORED HEARING AID (BAHA) IMPLANT;  Surgeon: Izora Gala, MD;  Location: [place];  Service: ENT;  Laterality: Right;  . Baha revision Right 08/30/2014    Procedure: BONE ANCHORED HEARING AID (BAHA) REVISION RIGHT SIDE;  Surgeon: Izora Gala, MD;  Location: [place];  Service: ENT;  Laterality: Right;  . Video assisted thoracoscopy (vats)/ lobectomy Left 10/09/2014    Procedure: VIDEO ASSISTED THORACOSCOPY (VATS)/ LOBECTOMY;  Surgeon: Ivin Poot, MD;  Location: [place];  Service: Thoracic;  Laterality: Left;    No family history on file.  Social History Social History  Substance Use Topics  . Smoking status: Former Smoker -- 1.00 packs/day for 35 years    Types: Cigarettes    Quit date: 05/11/2014  . Smokeless tobacco: None  . Alcohol Use: No    Current Outpatient Prescriptions  Medication Sig Dispense Refill  .  budesonide-formoterol (SYMBICORT) 160-4.5 MCG/ACT inhaler Inhale 2 puffs into the lungs 2 (two) times daily. 1 Inhaler 1  . guaiFENesin (MUCINEX) 600 MG 12 hr tablet Take 1 tablet (600 mg total) by mouth 2 (two) times daily. For cough    . hydrochlorothiazide (HYDRODIURIL) 25 MG tablet Take 1 tablet (25 mg total) by mouth daily. 30 tablet 2   No current facility-administered medications for this visit.    No Known Allergies  Review of Systems         Review of Systems :  [ y ] = yes, [  ] = no        General :  Weight gain [   ]    Weight loss  [ y  ]  Fatigue [  ]  Fever [  ]  Chills  [  ]                                Weakness  [  ]           Cardiac :  Chest pain/ pressure [ n ]  Resting SOB [  ] exertional SOB [  ]                        Orthopnea [  ]  Pedal edema  [  ]  Palpitations [  ] Syncope/presyncope '[ ]'$                         Paroxysmal nocturnal dyspnea [  ]        Pulmonary : cough [ n ]  wheezing [  ]  Hemoptysis [  ] Sputum Blue.Reese  ] Snoring [  ]                              Pneumothorax [  ]  Sleep apnea [  ]       GI : Vomiting [  ]  Dysphagia [  ]  Melena  [  ]  Abdominal pain [  ] BRBPR [  ]              Heart burn [  ]  Constipation [  ] Diarrhea  [  ] Colonoscopy [  ]       GU : Hematuria [  ]  Dysuria [  ]  Nocturia [  ] UTI's [  ]       Vascular : Claudication [  ]  Rest pain [  ]  DVT [  ] Vein stripping [  ] leg ulcers [  ]                          TIA [  ] Stroke [  ]  Varicose veins [  ]       NEURO :  Headaches  [  ] Seizures [  ] Vision changes [  ] Paresthesias [  ]       Musculoskeletal :  Arthritis [  ] Gout  [  ]  Back pain [  ]  Joint pain [  ]       Skin :  Rash [  ]  Melanoma [  ]        Heme : Bleeding problems [  ]Clotting Disorders [  ] Anemia [  ]Blood Transfusion '[ ]'$        Endocrine : Diabetes [  ] Thyroid Disorder  [  ]       Psych : Depression [  ]  Anxiety [  ]  Psych hospitalizations [  ]                                                BP 112/69 mmHg  Pulse 78  Resp 20  Ht '5\' 5"'$  (1.651 m)  Wt 135 lb (61.236 kg)  BMI 22.47 kg/m2  SpO2 96% Physical Exam      Physical Exam  General: pleasant middle-aged Caucasian male in no distress HEENT: Normocephalic pupils equal , dentition adequate Neck: Supple without JVD, adenopathy, or bruit Chest: Clear to auscultation, symmetrical breath sounds, no rhonchi, no tenderness             or deformity. Well-healed left VATS incision Cardiovascular: Regular rate and rhythm, no murmur, no gallop, peripheral pulses             palpable in all extremities Abdomen:  Soft, nontender, no palpable mass or organomegaly Extremities: Warm, well-perfused, no clubbing cyanosis edema or tenderness,  no venous stasis changes of the legs Rectal/GU: Deferred Neuro: Grossly non--focal and symmetrical throughout Skin: Clean and dry without rash or ulceration  Diagnostic Tests: CT scan of chest percent reviewed showing no evidence recurrent cancer  Impression: One year status post resection left upper lobe adenocarcinoma stage I A. No evidence recurrence by CT scan. Patient currently nonsmoker. Plan:annual CT scans for the next 4 years.   [name], MD [facility] [phone]

## 2015-09-22 IMAGING — CR DG CHEST 2V
3 series · 3 of 3 positions shown · non-contrast
Comparison: 10/12/2014

CLINICAL DATA: Left chest tube, status post left VATS

EXAM:
CHEST - 2 VIEW

[chest pa]
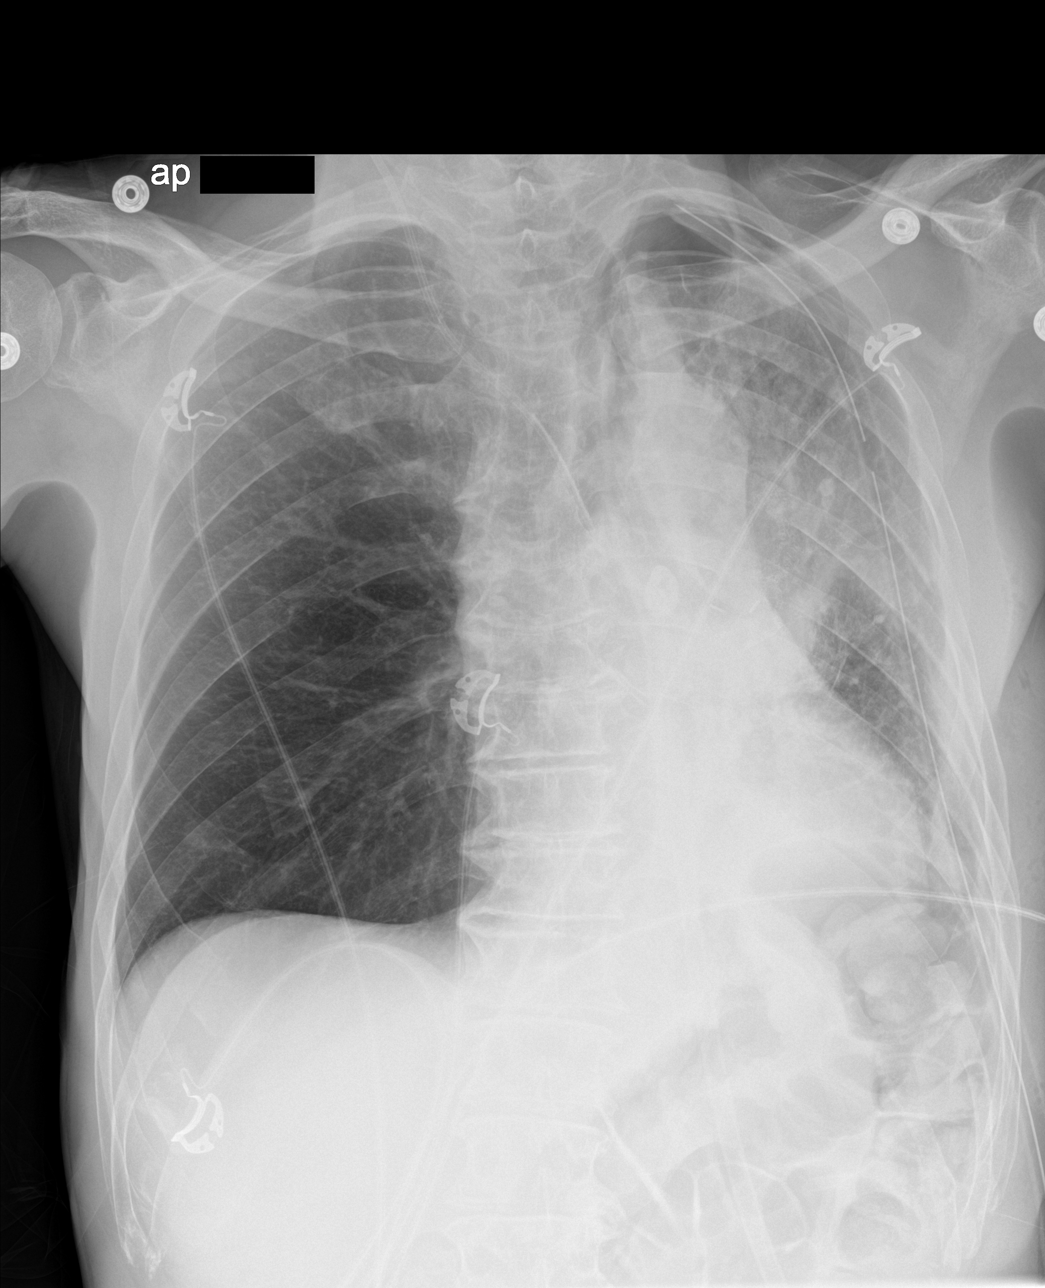

[chest lat (1 of 2)]
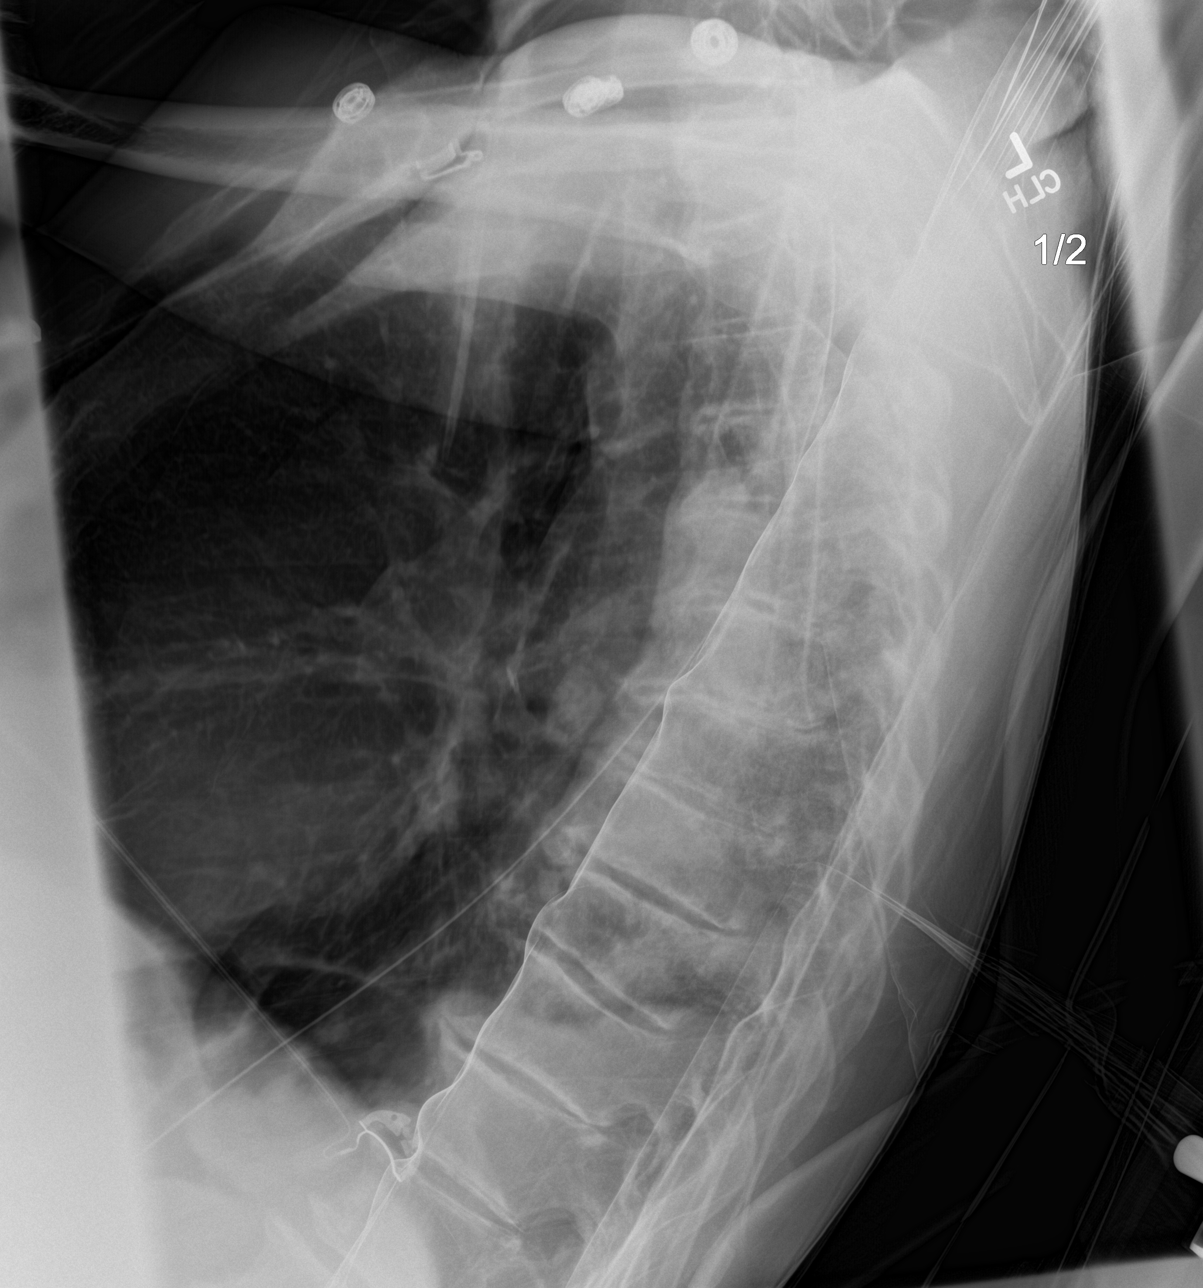

[chest lat (2 of 2)]
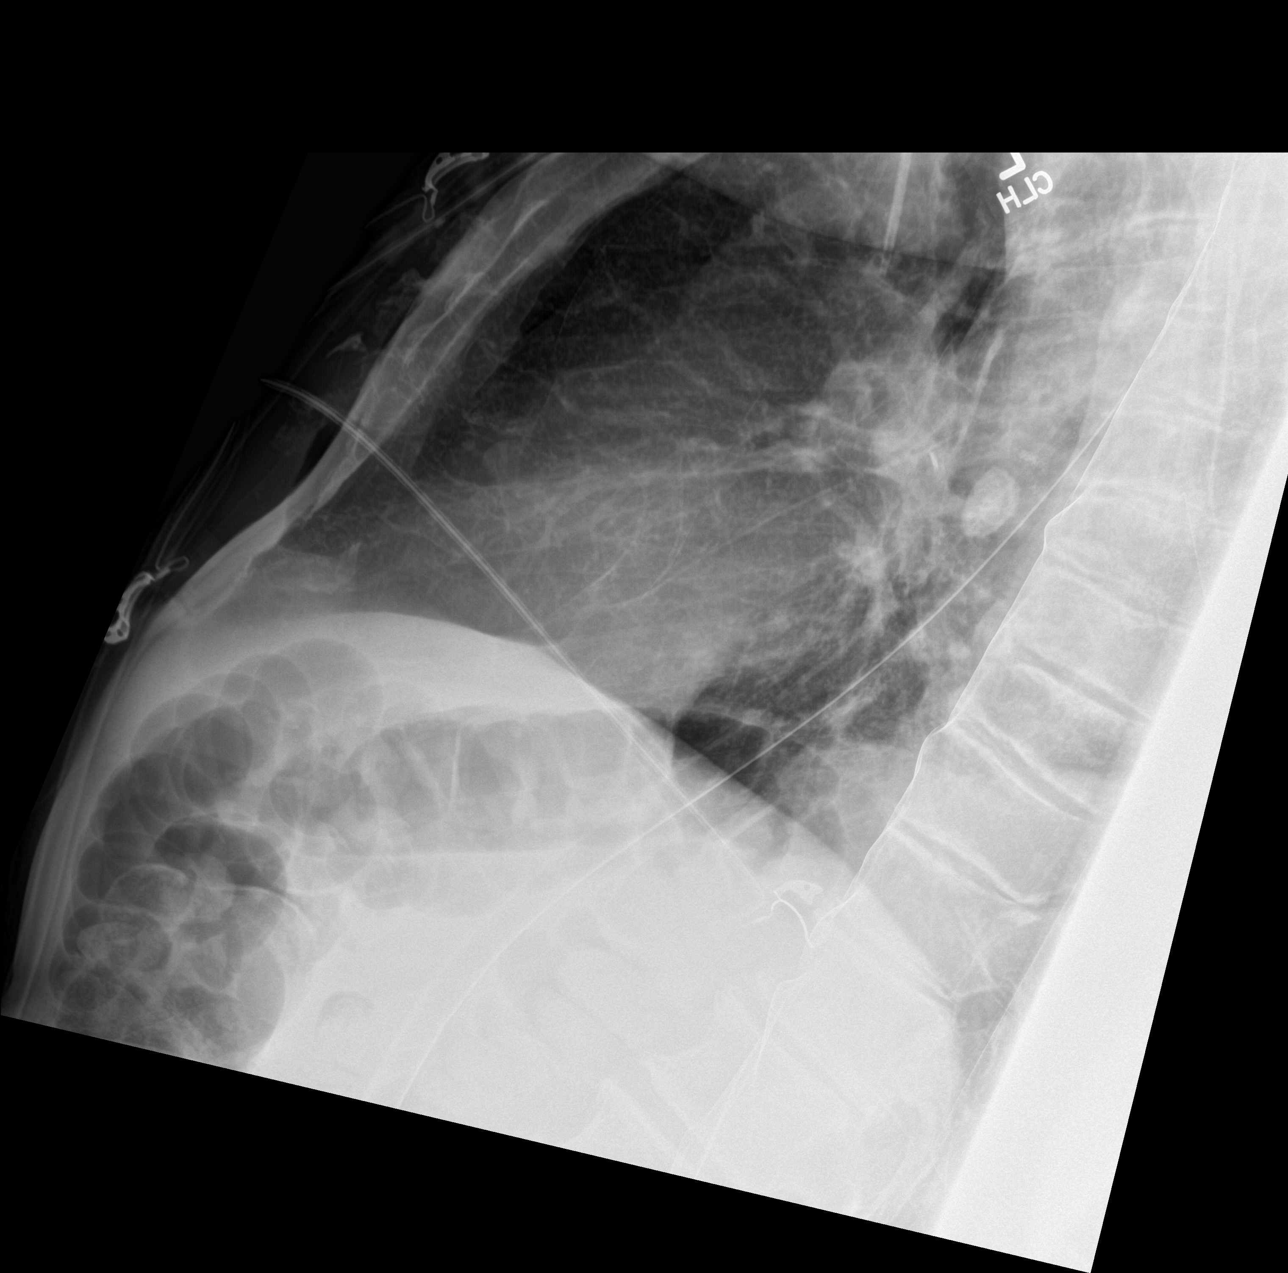

[3 of 3 positions shown; findings below may reference images not displayed]

FINDINGS: Stable left chest tube position extending to the apex. Small left
apical pneumothorax present, appearing slightly larger today. Postop
changes in the left lung with diffuse increased left lung
atelectasis/ airspace disease. Stable mild cardiomegaly. Right lung
remains clear. No enlarging effusion. Stable right IJ central line
position. Calcified mediastinal nodes noted.
IMPRESSION: Stable left chest tube.

Slight increased small left apical pneumothorax

Slight worsening diffuse atelectasis/airspace disease in the left
lung.

## 2015-09-23 IMAGING — DX DG CHEST 2V
2 series · 2 of 2 positions shown · non-contrast
Comparison: 10/13/2014

CLINICAL DATA: Status post thoracotomy.  Left lower chest burning.

EXAM:
CHEST  2 VIEW

[chest pa]
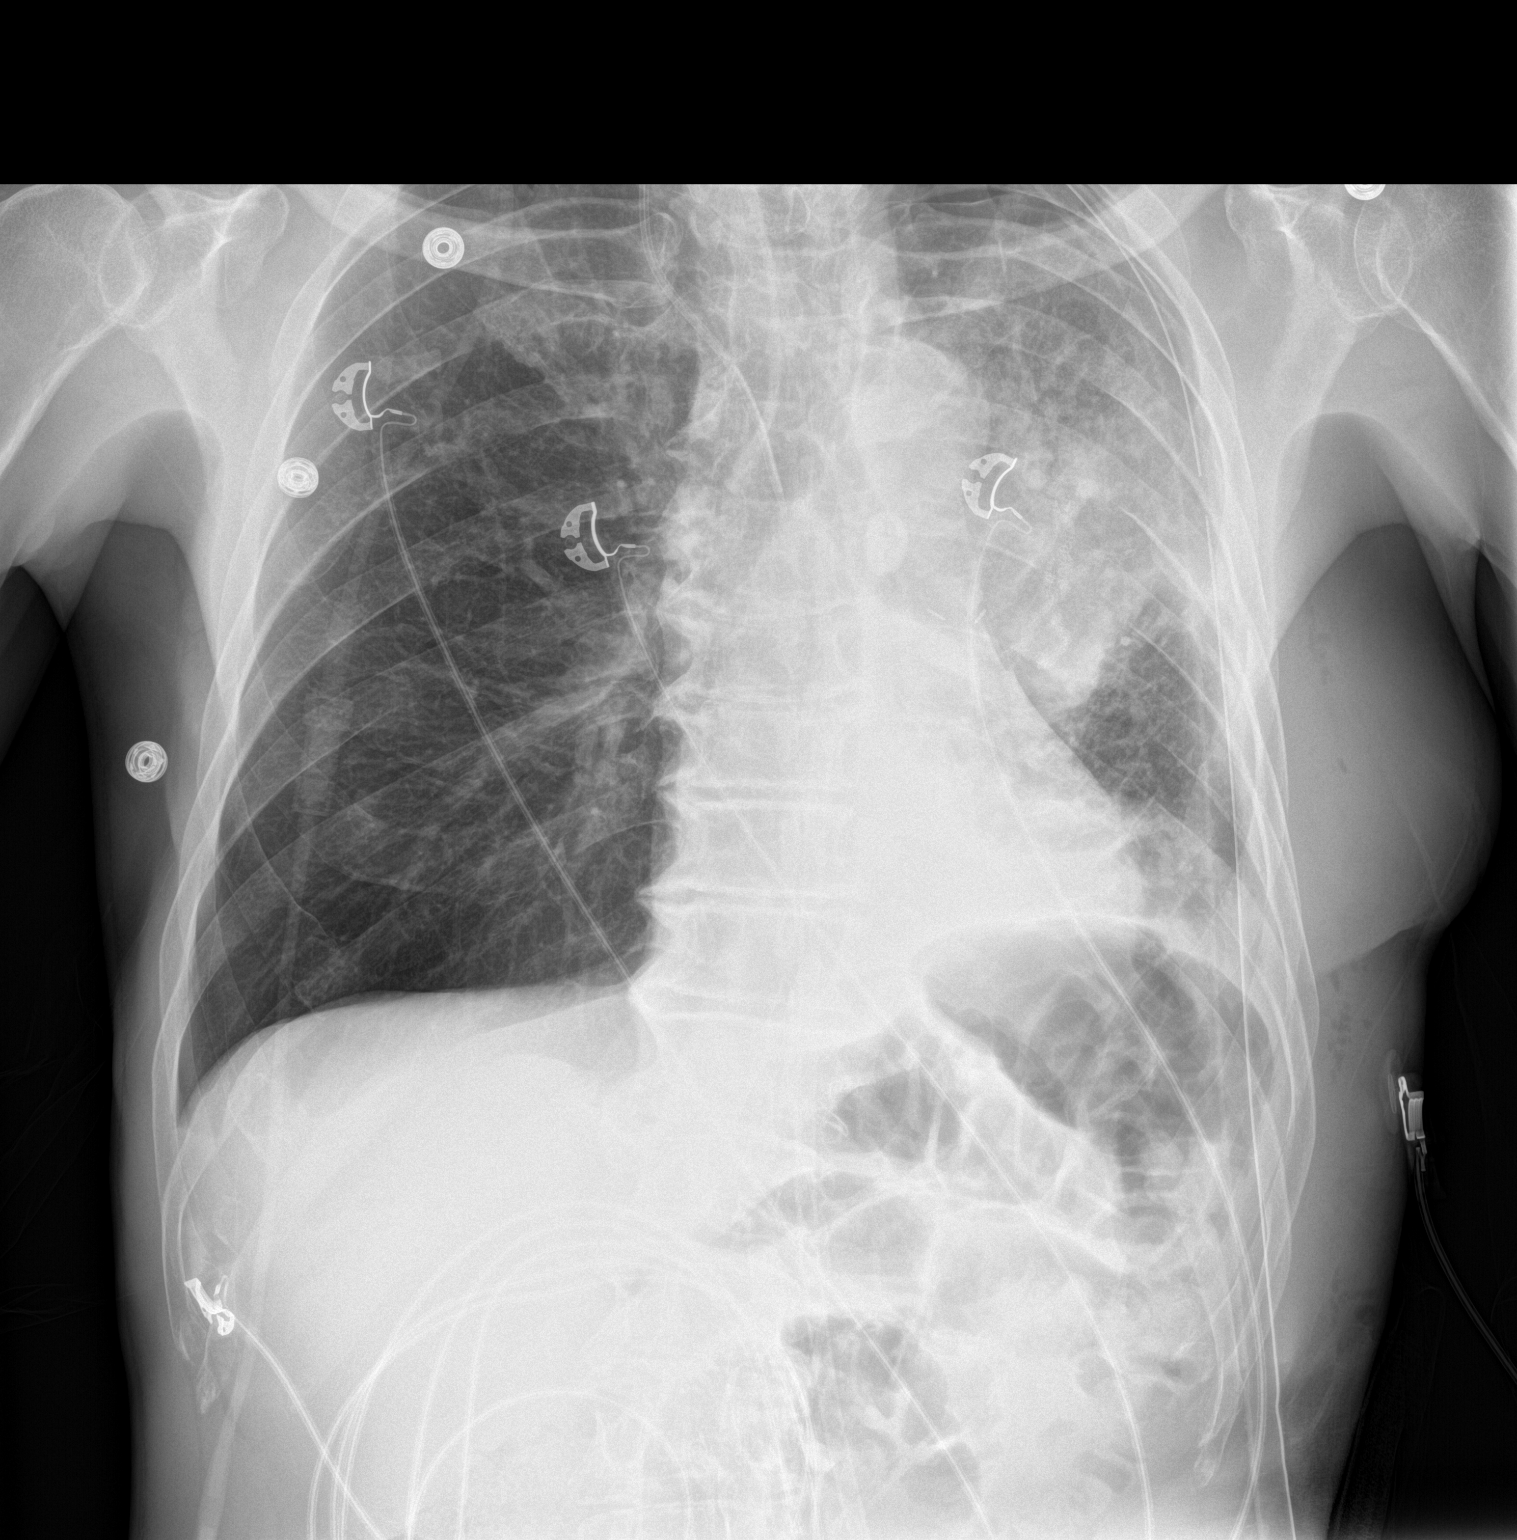

[chest lat]
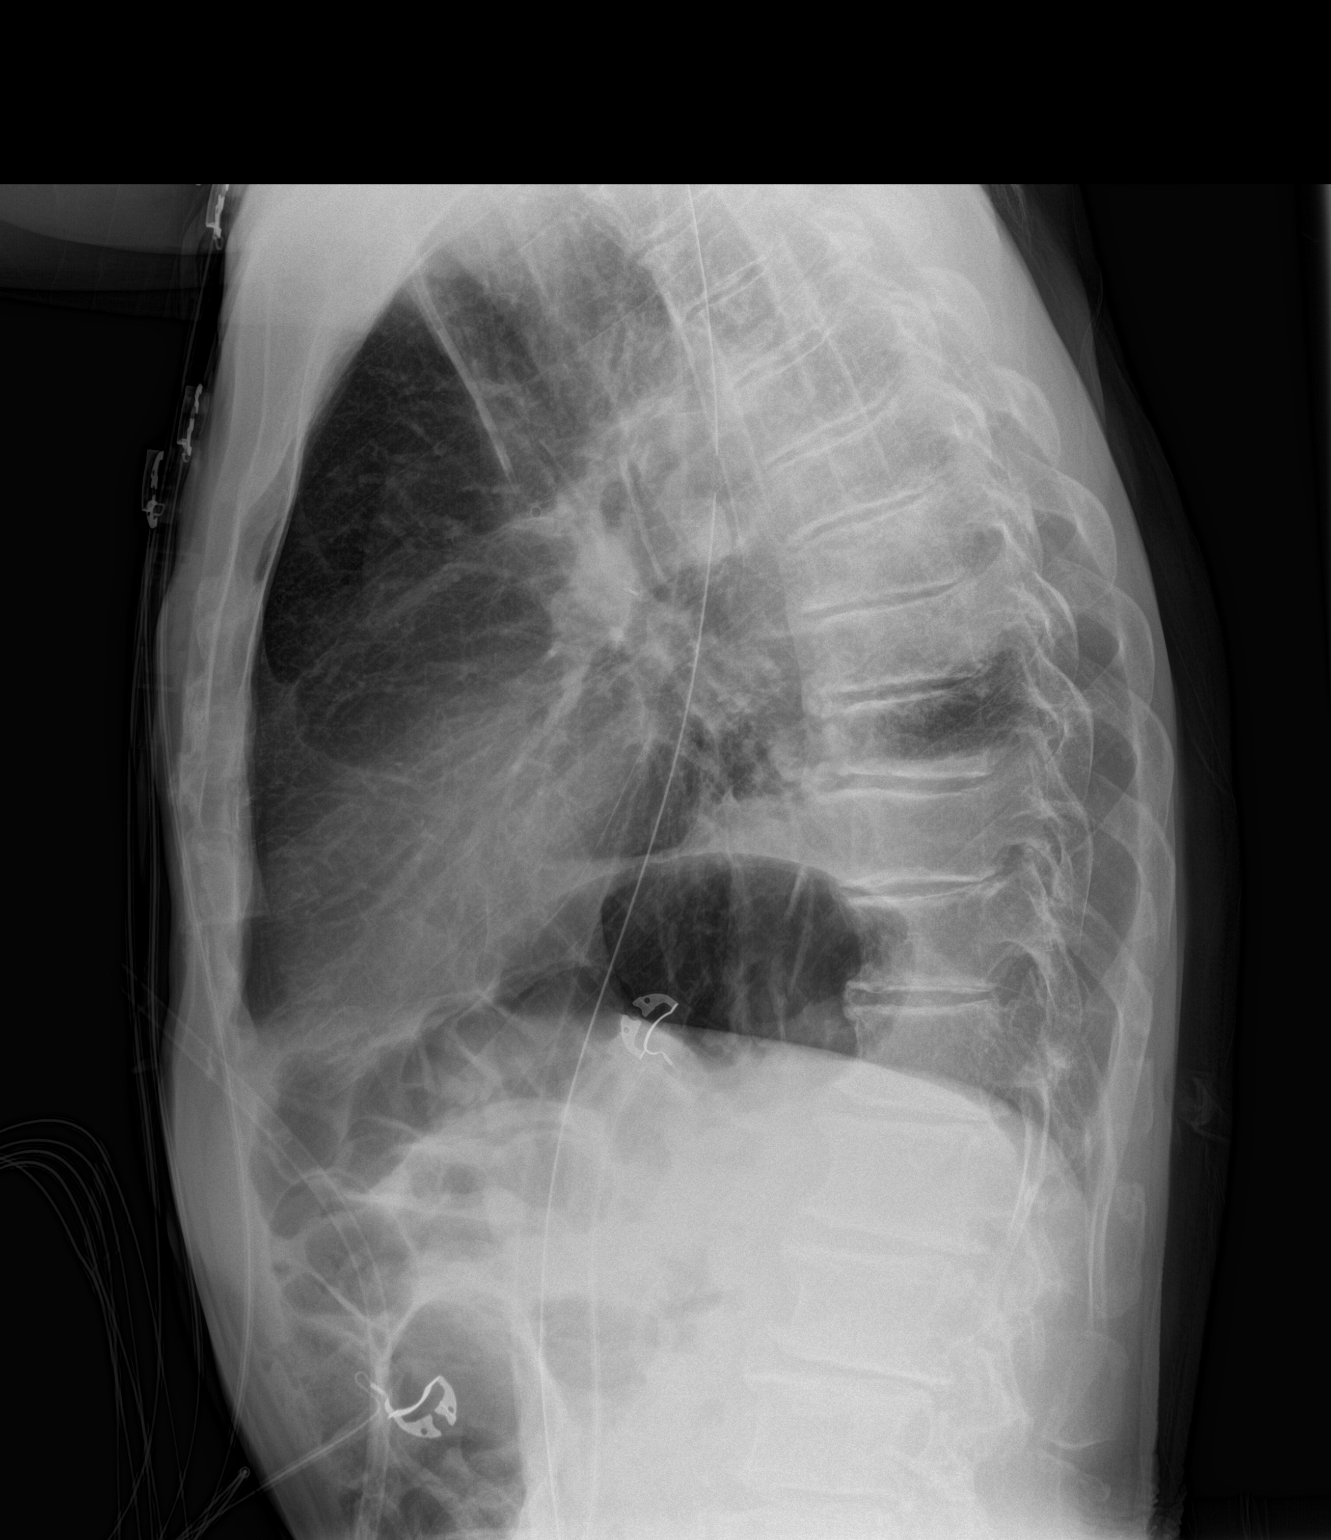

[2 of 2 positions shown; findings below may reference images not displayed]

FINDINGS: Right IJ central venous catheter tip projects over the superior vena
cava. Left chest tube stable in position. Multiple monitoring leads
overlie the patient. Stable cardiac and mediastinal contours.
Unchanged left mid and upper lung heterogeneous opacities.
Persistent small left apical pneumothorax. The right lung is well
aerated.
IMPRESSION: Stable left chest tube and central venous catheter.

Persistent small left apical pneumothorax.

Unchanged consolidative opacities within the left mid and upper
lung.

## 2015-09-24 IMAGING — CR DG CHEST 1V PORT SAME DAY
1 series · 1 of 1 positions shown · non-contrast
Comparison: Radiograph 10/14/2014

CLINICAL DATA: Pneumothorax.

EXAM:
PORTABLE CHEST - 1 VIEW SAME DAY

[AP]
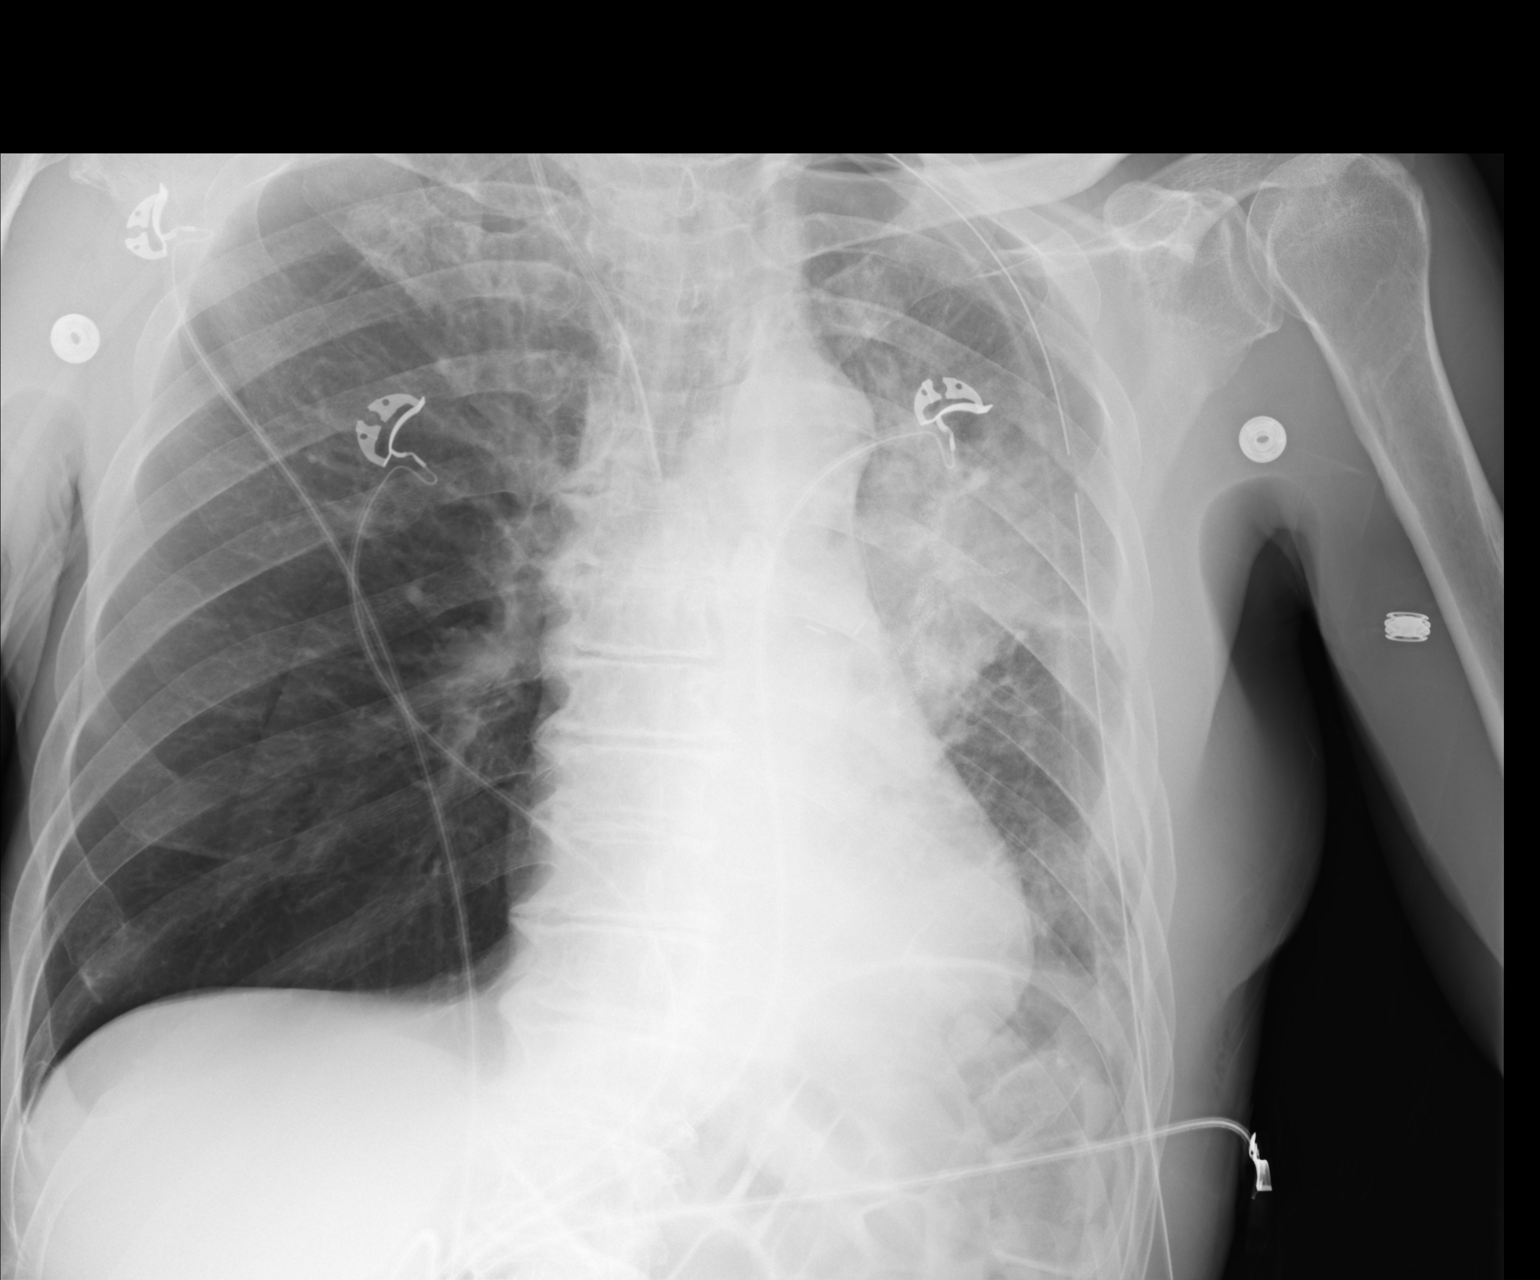

[1 of 1 positions shown; findings below may reference images not displayed]

FINDINGS: Left chest tube is unchanged. Small left apical pneumothorax
unchanged. There is volume loss within the left hemi thorax. Focus
of consolidation and surgical staples in the left upper lobe. Right
lung is hyperexpanded. Right central venous line is unchanged.
IMPRESSION: 1. Left chest tube in place with small apical pneumothorax.
2. Volume loss in the left hemi thorax with focus of postsurgical
consolidation.

## 2016-03-26 DIAGNOSIS — H906 Mixed conductive and sensorineural hearing loss, bilateral: Secondary | ICD-10-CM | POA: Diagnosis not present

## 2016-06-02 NOTE — Progress Notes (Signed)
This encounter was created in error - please disregard.

## 2016-07-30 DIAGNOSIS — Z23 Encounter for immunization: Secondary | ICD-10-CM | POA: Diagnosis not present

## 2016-07-30 DIAGNOSIS — E559 Vitamin D deficiency, unspecified: Secondary | ICD-10-CM | POA: Diagnosis not present

## 2016-07-30 DIAGNOSIS — R972 Elevated prostate specific antigen [PSA]: Secondary | ICD-10-CM | POA: Diagnosis not present

## 2016-07-30 DIAGNOSIS — R110 Nausea: Secondary | ICD-10-CM | POA: Diagnosis not present

## 2016-07-30 DIAGNOSIS — R634 Abnormal weight loss: Secondary | ICD-10-CM | POA: Diagnosis not present

## 2016-07-30 DIAGNOSIS — I10 Essential (primary) hypertension: Secondary | ICD-10-CM | POA: Diagnosis not present

## 2016-07-30 DIAGNOSIS — Z86010 Personal history of colonic polyps: Secondary | ICD-10-CM | POA: Diagnosis not present

## 2016-08-11 DIAGNOSIS — S93401A Sprain of unspecified ligament of right ankle, initial encounter: Secondary | ICD-10-CM | POA: Diagnosis not present

## 2016-08-11 DIAGNOSIS — S93601A Unspecified sprain of right foot, initial encounter: Secondary | ICD-10-CM | POA: Diagnosis not present

## 2016-08-13 ENCOUNTER — Emergency Department (HOSPITAL_COMMUNITY)
Admission: EM | Admit: 2016-08-13 | Discharge: 2016-08-13 | Disposition: A | Discharge status: Home / Self Care | Payer: Medicare Other | Attending: Emergency Medicine | Admitting: Emergency Medicine

## 2016-08-13 ENCOUNTER — Emergency Department (HOSPITAL_COMMUNITY): Payer: Medicare Other

## 2016-08-13 ENCOUNTER — Encounter (HOSPITAL_COMMUNITY): Payer: Self-pay | Admitting: Emergency Medicine

## 2016-08-13 DIAGNOSIS — J449 Chronic obstructive pulmonary disease, unspecified: Secondary | ICD-10-CM | POA: Insufficient documentation

## 2016-08-13 DIAGNOSIS — R1110 Vomiting, unspecified: Secondary | ICD-10-CM | POA: Diagnosis not present

## 2016-08-13 DIAGNOSIS — I10 Essential (primary) hypertension: Secondary | ICD-10-CM | POA: Diagnosis not present

## 2016-08-13 DIAGNOSIS — Z87891 Personal history of nicotine dependence: Secondary | ICD-10-CM | POA: Insufficient documentation

## 2016-08-13 DIAGNOSIS — K2970 Gastritis, unspecified, without bleeding: Secondary | ICD-10-CM | POA: Insufficient documentation

## 2016-08-13 DIAGNOSIS — Z79899 Other long term (current) drug therapy: Secondary | ICD-10-CM | POA: Insufficient documentation

## 2016-08-13 DIAGNOSIS — R112 Nausea with vomiting, unspecified: Secondary | ICD-10-CM | POA: Diagnosis not present

## 2016-08-13 DIAGNOSIS — R1030 Lower abdominal pain, unspecified: Secondary | ICD-10-CM | POA: Insufficient documentation

## 2016-08-13 LAB — URINALYSIS, ROUTINE W REFLEX MICROSCOPIC
Glucose, UA: NEGATIVE mg/dL
Ketones, ur: NEGATIVE mg/dL
NITRITE: NEGATIVE
PROTEIN: 30 mg/dL — AB
SPECIFIC GRAVITY, URINE: 1.03 (ref 1.005–1.030)
pH: 6.5 (ref 5.0–8.0)

## 2016-08-13 LAB — URINE MICROSCOPIC-ADD ON

## 2016-08-13 LAB — CBC
HCT: 45.5 % (ref 39.0–52.0)
HEMOGLOBIN: 16 g/dL (ref 13.0–17.0)
MCH: 30 pg (ref 26.0–34.0)
MCHC: 35.2 g/dL (ref 30.0–36.0)
MCV: 85.4 fL (ref 78.0–100.0)
PLATELETS: 242 10*3/uL (ref 150–400)
RBC: 5.33 MIL/uL (ref 4.22–5.81)
RDW: 13.6 % (ref 11.5–15.5)
WBC: 14.6 10*3/uL — AB (ref 4.0–10.5)

## 2016-08-13 LAB — COMPREHENSIVE METABOLIC PANEL
ALK PHOS: 66 U/L (ref 38–126)
ALT: 26 U/L (ref 17–63)
ANION GAP: 12 (ref 5–15)
AST: 28 U/L (ref 15–41)
Albumin: 4.3 g/dL (ref 3.5–5.0)
BUN: 28 mg/dL — ABNORMAL HIGH (ref 6–20)
CALCIUM: 9.5 mg/dL (ref 8.9–10.3)
CO2: 31 mmol/L (ref 22–32)
CREATININE: 0.77 mg/dL (ref 0.61–1.24)
Chloride: 93 mmol/L — ABNORMAL LOW (ref 101–111)
Glucose, Bld: 126 mg/dL — ABNORMAL HIGH (ref 65–99)
Potassium: 3.4 mmol/L — ABNORMAL LOW (ref 3.5–5.1)
SODIUM: 136 mmol/L (ref 135–145)
Total Bilirubin: 1.8 mg/dL — ABNORMAL HIGH (ref 0.3–1.2)
Total Protein: 7.7 g/dL (ref 6.5–8.1)

## 2016-08-13 LAB — LIPASE, BLOOD: LIPASE: 31 U/L (ref 11–51)

## 2016-08-13 MED ORDER — FAMOTIDINE 20 MG PO TABS: 20.0000 mg | ORAL_TABLET | Freq: Two times a day (BID) | ORAL | 0 refills | Status: DC

## 2016-08-13 MED ORDER — MORPHINE SULFATE (PF) 4 MG/ML IV SOLN
4.0000 mg | Freq: Once | INTRAVENOUS | Status: AC
  Administered 2016-08-13: 4 mg via INTRAVENOUS
  Filled 2016-08-13: qty 1

## 2016-08-13 MED ORDER — ONDANSETRON HCL 4 MG/2ML IJ SOLN
4.0000 mg | Freq: Once | INTRAMUSCULAR | Status: AC
  Administered 2016-08-13: 4 mg via INTRAVENOUS
  Filled 2016-08-13: qty 2

## 2016-08-13 MED ORDER — SODIUM CHLORIDE 0.9 % IV BOLUS (SEPSIS)
1000.0000 mL | Freq: Once | INTRAVENOUS | Status: AC
  Administered 2016-08-13: 1000 mL via INTRAVENOUS

## 2016-08-13 MED ORDER — IOPAMIDOL (ISOVUE-300) INJECTION 61%
100.0000 mL | Freq: Once | INTRAVENOUS | Status: AC | PRN
  Administered 2016-08-13: 100 mL via INTRAVENOUS

## 2016-08-13 MED ORDER — ONDANSETRON HCL 4 MG PO TABS: 4.0000 mg | ORAL_TABLET | Freq: Four times a day (QID) | ORAL | 0 refills | Status: DC

## 2016-08-13 NOTE — ED Triage Notes (Addendum)
Patient reports vomiting x 3 days.  States that he was seen at PCP for ankle injury and started on pain medication. Reports he took one pain pill and since then has been unable to eat.  States every time he eats he vomits.  Reports chills.  Patient denies abdominal pain. In NAD

## 2016-08-13 NOTE — ED Provider Notes (Signed)
[facility] DEPT Provider Note   CSN: [id number] Arrival date & time: 08/13/16  8938     History   Chief Complaint Chief Complaint  Patient presents with  . Emesis    HPI Kyle Hamilton is a 60 y.o. male.  Pt presents to the ED with n/v.  The pt denies any pain.  The pt denies any f/c.      Past Medical History:  Diagnosis Date  . COPD (chronic obstructive pulmonary disease) ([place])   . Hearing loss   . Hypertension   . Shortness of breath dyspnea    on exertion  . Spinal stenosis     Patient Active Problem List   Diagnosis Date Noted  . Lung mass 10/09/2014  . Nodule of left lung 09/19/2014    Past Surgical History:  Procedure Laterality Date  . BAHA REVISION Right 08/30/2014   Procedure: BONE ANCHORED HEARING AID (BAHA) REVISION RIGHT SIDE;  Surgeon: Izora Gala, MD;  Location: [place];  Service: ENT;  Laterality: Right;  . BONE ANCHORED HEARING AID IMPLANT  08/03/2012   Procedure: BONE ANCHORED HEARING AID (BAHA) IMPLANT;  Surgeon: Izora Gala, MD;  Location: [place];  Service: ENT;  Laterality: Right;  . LEG SURGERY     right leg deep cut  . STAPEDES SURGERY     bilateral  . VIDEO ASSISTED THORACOSCOPY (VATS)/ LOBECTOMY Left 10/09/2014   Procedure: VIDEO ASSISTED THORACOSCOPY (VATS)/ LOBECTOMY;  Surgeon: Ivin Poot, MD;  Location: [place];  Service: Thoracic;  Laterality: Left;       Home Medications    Prior to Admission medications   Medication Sig Start Date End Date Taking? Authorizing Provider  hydrochlorothiazide (HYDRODIURIL) 25 MG tablet Take 1 tablet (25 mg total) by mouth daily. 01/19/14  Yes Freeman Caldron Baker, PA-C  naproxen (NAPROSYN) 500 MG tablet TK 1 T PO  BID WITH FOOD 08/11/16  Yes Historical Provider, MD  traMADol-acetaminophen (ULTRACET) 37.5-325 MG tablet TK 1 T PO Q 4 TO 6 H PRN PAIN 08/11/16  Yes Historical Provider, MD  budesonide-formoterol (SYMBICORT) 160-4.5 MCG/ACT inhaler Inhale 2 puffs into the lungs 2 (two) times daily. 10/17/14    Donielle Liston Alba, PA-C  famotidine (PEPCID) 20 MG tablet Take 1 tablet (20 mg total) by mouth 2 (two) times daily. 08/13/16   Isla Pence, MD  guaiFENesin (MUCINEX) 600 MG 12 hr tablet Take 1 tablet (600 mg total) by mouth 2 (two) times daily. For cough Patient not taking: Reported on 08/13/2016 10/17/14   Nani Skillern, PA-C  ondansetron (ZOFRAN) 4 MG tablet Take 1 tablet (4 mg total) by mouth every 6 (six) hours. 08/13/16   Isla Pence, MD    Family History History reviewed. No pertinent family history.  Social History Social History  Substance Use Topics  . Smoking status: Former Smoker    Packs/day: 1.00    Years: 35.00    Types: Cigarettes    Quit date: 05/11/2014  . Smokeless tobacco: Never Used  . Alcohol use No     Allergies   Review of patient's allergies indicates no known allergies.   Review of Systems Review of Systems  Gastrointestinal: Positive for nausea and vomiting.  All other systems reviewed and are negative.    Physical Exam Updated Vital Signs BP 105/73 (BP Location: Left Arm)   Pulse 61   Temp 97.5 F (36.4 C) (Oral)   Resp 16   Ht '5\' 5"'$  (1.651 m)   Wt 125 lb (56.7 kg)  SpO2 95%   BMI 20.80 kg/m   Physical Exam  Constitutional: He is oriented to person, place, and time. He appears well-developed and well-nourished.  HENT:  Head: Normocephalic and atraumatic.  Right Ear: External ear normal.  Left Ear: External ear normal.  Nose: Nose normal.  Mouth/Throat: Oropharynx is clear and moist.  Eyes: Conjunctivae and EOM are normal. Pupils are equal, round, and reactive to light.  Neck: Normal range of motion. Neck supple.  Cardiovascular: Normal rate, regular rhythm, normal heart sounds and intact distal pulses.   Pulmonary/Chest: Effort normal and breath sounds normal.  Abdominal: Soft. There is tenderness in the suprapubic area.  Musculoskeletal: Normal range of motion.  Neurological: He is alert and oriented to person, place,  and time.  Skin: Skin is warm.  Psychiatric: He has a normal mood and affect. His behavior is normal. Judgment and thought content normal.  Nursing note and vitals reviewed.    ED Treatments / Results  Labs (all labs ordered are listed, but only abnormal results are displayed) Labs Reviewed  COMPREHENSIVE METABOLIC PANEL - Abnormal; Notable for the following:       Result Value   Potassium 3.4 (*)    Chloride 93 (*)    Glucose, Bld 126 (*)    BUN 28 (*)    Total Bilirubin 1.8 (*)    All other components within normal limits  CBC - Abnormal; Notable for the following:    WBC 14.6 (*)    All other components within normal limits  URINALYSIS, ROUTINE W REFLEX MICROSCOPIC (NOT AT [facility]) - Abnormal; Notable for the following:    Color, Urine AMBER (*)    APPearance CLOUDY (*)    Hgb urine dipstick TRACE (*)    Bilirubin Urine SMALL (*)    Protein, ur 30 (*)    Leukocytes, UA SMALL (*)    All other components within normal limits  URINE MICROSCOPIC-ADD ON - Abnormal; Notable for the following:    Squamous Epithelial / LPF 0-5 (*)    Bacteria, UA RARE (*)    Casts GRANULAR CAST (*)    All other components within normal limits  LIPASE, BLOOD    EKG  EKG Interpretation None       Radiology Ct Abdomen Pelvis W Contrast  Result Date: 08/13/2016 CLINICAL DATA:  Vomiting for 3 days.  Chills EXAM: CT ABDOMEN AND PELVIS WITH CONTRAST TECHNIQUE: Multidetector CT imaging of the abdomen and pelvis was performed using the standard protocol following bolus administration of intravenous contrast. CONTRAST:  128m ISOVUE-300 IOPAMIDOL (ISOVUE-300) INJECTION 61% COMPARISON:  Radiograph 11 11,009, PET-CT 09/26/2014 FINDINGS: Lower chest: Lung bases clear. No acute infiltrates or effusions. Heart size nonenlarged. No pericardial effusion at the lung base. Hepatobiliary: Multiple sub cm hypodense lesions within the liver, probable cysts but too small to further characterize. Peripherally  enhancing hypodense lesion in the lateral segment of the left lobe of the liver measuring 1.9 cm, series 2, image number 9. There is progressive filling in of the lesion on delayed images suggesting a hemangioma. This is likely stable compared to non enhanced PET-CT images. No activity was seen in the region. Gallbladder contains no stones. No wall thickening. No intra or extrahepatic biliary dilatation. Pancreas: Mildly prominent pancreatic duct. No surrounding inflammatory changes. Spleen: Normal in size without focal abnormality. Adrenals/Urinary Tract: Bilateral adrenal glands appear normal. 4 mm nonobstructing stone in the lower pole of the left kidney. Multiple bilateral sub cm cortical hypodense lesions within the bilateral  kidneys, likely cysts. Urinary bladder is unremarkable. Stomach/Bowel: Mild mucosal enhancement of the gastric wall. Mildly prominent fluid filled jejunal loops in the left upper quadrant but without evidence for bowel obstruction. Moderate to large amount of stool in the colon. Appendix is poorly identified. Vascular/Lymphatic: Moderate atherosclerotic calcifications within the aorta and iliac vessels. No aneurysm. No significantly enlarged retroperitoneal lymph nodes. Reproductive: Mildly prominent prostate gland with coarse calcifications. Other: No free air or free fluid. Musculoskeletal: No acute osseous abnormality. Marked disc space narrowing at L2-L3 and L3-L4. Posterior disc osteophytes at L1-L2, L2-L3, L3-L4 and L4-L5 with multilevel canal stenosis, most marked at L2-L3 and L3-L4. IMPRESSION: 1. Mild gastric wall thickening and mucosal enhancement could relate to a gastritis. There are a few fluid-filled mildly prominent loops of small bowel in the left upper quadrant bowel without evidence for bowel obstruction, findings could be secondary to a mild ileus. There is moderate stool burden. 2. Multiple hypodense subcentimeter lesions in the liver, probable cysts. A dominant lesion  in the lateral segment of the left lobe has imaging features suggestive of hemangioma. 3. Multiple nonspecific sub cm cortical hypodense lesions in the kidneys. 4. Atherosclerotic vascular disease of the aorta. 5. 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 Electronically Signed   By: Donavan Foil M.D.   On: 08/13/2016 22:38    Procedures Procedures (including critical care time)  Medications Ordered in ED Medications  sodium chloride 0.9 % bolus 1,000 mL (1,000 mLs Intravenous New Bag/Given 08/13/16 2208)  morphine 4 MG/ML injection 4 mg (4 mg Intravenous Given 08/13/16 2207)  ondansetron (ZOFRAN) injection 4 mg (4 mg Intravenous Given 08/13/16 2207)  iopamidol (ISOVUE-300) 61 % injection 100 mL (100 mLs Intravenous Contrast Given 08/13/16 2155)     Initial Impression / Assessment and Plan / ED Course  I have reviewed the triage vital signs and the nursing notes.  Pertinent labs & imaging results that were available during my care of the patient were reviewed by me and considered in my medical decision making (see chart for details).  Clinical Course    Pt is feeling much better.  He is tolerating po fluids.  Final Clinical Impressions(s) / ED Diagnoses   Final diagnoses:  Nausea and vomiting, intractability of vomiting not specified, unspecified vomiting type  Gastritis without bleeding, unspecified chronicity, unspecified gastritis type    New Prescriptions New Prescriptions   FAMOTIDINE (PEPCID) 20 MG TABLET    Take 1 tablet (20 mg total) by mouth 2 (two) times daily.   ONDANSETRON (ZOFRAN) 4 MG TABLET    Take 1 tablet (4 mg total) by mouth every 6 (six) hours.     Isla Pence, MD 08/13/16 [phone]

## 2016-08-26 ENCOUNTER — Other Ambulatory Visit: Payer: Self-pay | Admitting: Sports Medicine

## 2016-08-26 DIAGNOSIS — S93491A Sprain of other ligament of right ankle, initial encounter: Secondary | ICD-10-CM | POA: Diagnosis not present

## 2016-08-26 DIAGNOSIS — S92014A Nondisplaced fracture of body of right calcaneus, initial encounter for closed fracture: Secondary | ICD-10-CM

## 2016-08-26 DIAGNOSIS — S99811A Other specified injuries of right ankle, initial encounter: Secondary | ICD-10-CM | POA: Diagnosis not present

## 2016-08-26 DIAGNOSIS — S99821A Other specified injuries of right foot, initial encounter: Secondary | ICD-10-CM | POA: Diagnosis not present

## 2016-09-01 ENCOUNTER — Ambulatory Visit
Admission: RE | Admit: 2016-09-01 | Discharge: 2016-09-01 | Disposition: A | Discharge status: Home / Self Care | Payer: Medicare Other | Source: Ambulatory Visit | Attending: Sports Medicine | Admitting: Sports Medicine

## 2016-09-01 DIAGNOSIS — S92014A Nondisplaced fracture of body of right calcaneus, initial encounter for closed fracture: Secondary | ICD-10-CM

## 2016-09-01 DIAGNOSIS — S92254A Nondisplaced fracture of navicular [scaphoid] of right foot, initial encounter for closed fracture: Secondary | ICD-10-CM | POA: Diagnosis not present

## 2016-09-03 ENCOUNTER — Other Ambulatory Visit: Payer: Self-pay | Admitting: Cardiothoracic Surgery

## 2016-09-03 DIAGNOSIS — H9202 Otalgia, left ear: Secondary | ICD-10-CM | POA: Diagnosis not present

## 2016-09-03 DIAGNOSIS — H7013 Chronic mastoiditis, bilateral: Secondary | ICD-10-CM | POA: Insufficient documentation

## 2016-09-03 DIAGNOSIS — H7012 Chronic mastoiditis, left ear: Secondary | ICD-10-CM | POA: Diagnosis not present

## 2016-09-03 DIAGNOSIS — R911 Solitary pulmonary nodule: Secondary | ICD-10-CM

## 2016-09-07 DIAGNOSIS — S92014D Nondisplaced fracture of body of right calcaneus, subsequent encounter for fracture with routine healing: Secondary | ICD-10-CM | POA: Diagnosis not present

## 2016-09-07 DIAGNOSIS — S93401D Sprain of unspecified ligament of right ankle, subsequent encounter: Secondary | ICD-10-CM | POA: Diagnosis not present

## 2016-09-17 DIAGNOSIS — H7012 Chronic mastoiditis, left ear: Secondary | ICD-10-CM | POA: Diagnosis not present

## 2016-09-23 ENCOUNTER — Ambulatory Visit
Admission: RE | Admit: 2016-09-23 | Discharge: 2016-09-23 | Disposition: A | Discharge status: Home / Self Care | Payer: Medicare Other | Source: Ambulatory Visit | Attending: Cardiothoracic Surgery | Admitting: Cardiothoracic Surgery

## 2016-09-23 ENCOUNTER — Ambulatory Visit (INDEPENDENT_AMBULATORY_CARE_PROVIDER_SITE_OTHER): Payer: Medicare Other | Admitting: Cardiothoracic Surgery

## 2016-09-23 ENCOUNTER — Encounter: Payer: Self-pay | Admitting: Cardiothoracic Surgery

## 2016-09-23 VITALS — BP 96/67 | HR 55 | Resp 20 | Ht 65.0 in | Wt 125.0 lb

## 2016-09-23 DIAGNOSIS — Z9889 Other specified postprocedural states: Secondary | ICD-10-CM | POA: Diagnosis not present

## 2016-09-23 DIAGNOSIS — C3411 Malignant neoplasm of upper lobe, right bronchus or lung: Secondary | ICD-10-CM | POA: Diagnosis not present

## 2016-09-23 DIAGNOSIS — C3412 Malignant neoplasm of upper lobe, left bronchus or lung: Secondary | ICD-10-CM | POA: Diagnosis not present

## 2016-09-23 DIAGNOSIS — R911 Solitary pulmonary nodule: Secondary | ICD-10-CM

## 2016-09-23 MED ORDER — IOPAMIDOL (ISOVUE-300) INJECTION 61%
75.0000 mL | Freq: Once | INTRAVENOUS | Status: AC | PRN
  Administered 2016-09-23: 75 mL via INTRAVENOUS

## 2016-09-23 NOTE — Progress Notes (Signed)
PCP is NNODI, Doreene Burke, MD Referring Provider is Izora Gala, MD  Chief Complaint  Patient presents with  . Follow-up    1 year f/u with Chest CT HX of left upper lobe adenocarcinoma    HPI: Patient reports for annual follow-up with CT scan 2 years after left upper lobe wedge resection of 1.8 cm adenocarcinoma, stage I. Patient is a nonsmoker. Patient denies pulmonary symptoms. CT today shows no evidence recurrence or new lesions in the left lung. Right upper lobe has a 4 mm nodular density which has increased by 2 mm in size over the past year. Follow-up scan is recommended.  Past Medical History:  Diagnosis Date  . COPD (chronic obstructive pulmonary disease) ([place])   . Hearing loss   . Hypertension   . Shortness of breath dyspnea    on exertion  . Spinal stenosis     Past Surgical History:  Procedure Laterality Date  . BAHA REVISION Right 08/30/2014   Procedure: BONE ANCHORED HEARING AID (BAHA) REVISION RIGHT SIDE;  Surgeon: Izora Gala, MD;  Location: [facility];  Service: ENT;  Laterality: Right;  . BONE ANCHORED HEARING AID IMPLANT  08/03/2012   Procedure: BONE ANCHORED HEARING AID (BAHA) IMPLANT;  Surgeon: Izora Gala, MD;  Location: [facility];  Service: ENT;  Laterality: Right;  . LEG SURGERY     right leg deep cut  . STAPEDES SURGERY     bilateral  . VIDEO ASSISTED THORACOSCOPY (VATS)/ LOBECTOMY Left 10/09/2014   Procedure: VIDEO ASSISTED THORACOSCOPY (VATS)/ LOBECTOMY;  Surgeon: Ivin Poot, MD;  Location: [facility];  Service: Thoracic;  Laterality: Left;    No family history on file.  Social History Social History  Substance Use Topics  . Smoking status: Former Smoker    Packs/day: 1.00    Years: 35.00    Types: Cigarettes    Quit date: 05/11/2014  . Smokeless tobacco: Never Used  . Alcohol use No    Current Outpatient Prescriptions  Medication Sig Dispense Refill  . budesonide-formoterol (SYMBICORT) 160-4.5 MCG/ACT inhaler Inhale 2 puffs into the lungs 2 (two) times  daily. 1 Inhaler 1  . famotidine (PEPCID) 20 MG tablet Take 1 tablet (20 mg total) by mouth 2 (two) times daily. 30 tablet 0  . guaiFENesin (MUCINEX) 600 MG 12 hr tablet Take 1 tablet (600 mg total) by mouth 2 (two) times daily. For cough    . hydrochlorothiazide (HYDRODIURIL) 25 MG tablet Take 1 tablet (25 mg total) by mouth daily. 30 tablet 2  . naproxen (NAPROSYN) 500 MG tablet TK 1 T PO  BID WITH FOOD  1  . ondansetron (ZOFRAN) 4 MG tablet Take 1 tablet (4 mg total) by mouth every 6 (six) hours. 12 tablet 0  . traMADol-acetaminophen (ULTRACET) 37.5-325 MG tablet TK 1 T PO Q 4 TO 6 H PRN PAIN  1   No current facility-administered medications for this visit.     No Known Allergies  Review of Systems  Weight is stable Recently sustained a fall and spring ligaments in his right ankle and is currently in a walking boot  BP 96/67 (BP Location: Right Arm, Patient Position: Sitting, Cuff Size: Small)   Pulse (!) 55   Resp 20   Ht '5\' 5"'$  (1.651 m)   Wt 125 lb (56.7 kg)   SpO2 90% Comment: RA  BMI 20.80 kg/m  Physical Exam      Exam    General- alert and comfortable   Lungs- clear without rales,  wheezes   Cor- regular rate and rhythm, no murmur , gallop   Abdomen- soft, non-tender   Extremities - warm, non-tender, minimal edema   Neuro- oriented, appropriate, no focal weakness   Neck without adenopathy or JVD  Diagnostic Tests: CT scan shows no evidence recurrence or new lesions a left lung after left upper lobe wedge resection 2 years ago Small 4 mm right upper lobe nodule with slight increase in size for the past year  Impression: No evidence of recurrent lung cancer after 2 years following resection  Plan: Return with surveillance CT scan of chest in 8 months to assess the recently noted right upper lobe 4 mm nodule  [name], MD [facility] [phone]

## 2016-09-28 DIAGNOSIS — S92014D Nondisplaced fracture of body of right calcaneus, subsequent encounter for fracture with routine healing: Secondary | ICD-10-CM | POA: Diagnosis not present

## 2016-10-19 DIAGNOSIS — S92014D Nondisplaced fracture of body of right calcaneus, subsequent encounter for fracture with routine healing: Secondary | ICD-10-CM | POA: Diagnosis not present

## 2016-11-16 DIAGNOSIS — S92014D Nondisplaced fracture of body of right calcaneus, subsequent encounter for fracture with routine healing: Secondary | ICD-10-CM | POA: Diagnosis not present

## 2016-11-16 DIAGNOSIS — S93491D Sprain of other ligament of right ankle, subsequent encounter: Secondary | ICD-10-CM | POA: Diagnosis not present

## 2017-04-20 ENCOUNTER — Other Ambulatory Visit: Payer: Self-pay | Admitting: *Deleted

## 2017-04-20 DIAGNOSIS — R918 Other nonspecific abnormal finding of lung field: Secondary | ICD-10-CM

## 2017-05-31 ENCOUNTER — Other Ambulatory Visit: Payer: Self-pay | Admitting: *Deleted

## 2017-05-31 DIAGNOSIS — R911 Solitary pulmonary nodule: Secondary | ICD-10-CM

## 2017-06-02 ENCOUNTER — Encounter: Payer: Self-pay | Admitting: Cardiothoracic Surgery

## 2017-06-02 ENCOUNTER — Other Ambulatory Visit: Payer: Self-pay | Admitting: *Deleted

## 2017-06-02 ENCOUNTER — Ambulatory Visit
Admission: RE | Admit: 2017-06-02 | Discharge: 2017-06-02 | Disposition: A | Discharge status: Home / Self Care | Payer: Medicare Other | Source: Ambulatory Visit | Attending: Cardiothoracic Surgery | Admitting: Cardiothoracic Surgery

## 2017-06-02 ENCOUNTER — Ambulatory Visit (INDEPENDENT_AMBULATORY_CARE_PROVIDER_SITE_OTHER): Payer: Medicare Other | Admitting: Cardiothoracic Surgery

## 2017-06-02 VITALS — BP 124/85 | HR 55 | Resp 16 | Ht 65.0 in | Wt 123.5 lb

## 2017-06-02 DIAGNOSIS — R911 Solitary pulmonary nodule: Secondary | ICD-10-CM | POA: Diagnosis not present

## 2017-06-02 DIAGNOSIS — Z9889 Other specified postprocedural states: Secondary | ICD-10-CM | POA: Diagnosis not present

## 2017-06-02 DIAGNOSIS — C3412 Malignant neoplasm of upper lobe, left bronchus or lung: Secondary | ICD-10-CM | POA: Diagnosis not present

## 2017-06-02 DIAGNOSIS — J449 Chronic obstructive pulmonary disease, unspecified: Secondary | ICD-10-CM

## 2017-06-02 MED ORDER — BUDESONIDE-FORMOTEROL FUMARATE 160-4.5 MCG/ACT IN AERO: 2.0000 | INHALATION_SPRAY | Freq: Two times a day (BID) | RESPIRATORY_TRACT | 1 refills | Status: DC

## 2017-06-02 NOTE — Progress Notes (Signed)
PCP is Kyle Pound, MD Referring Provider is Izora Gala, MD  Chief Complaint  Patient presents with  . 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 He stopped smoking at that time. He presents today without any new pulmonary symptoms. Last CT scan 6 months ago showed a 4 mm nodule in the posterior segment of the right upper lobe adjacent to a cystic structure-pneumatocele.  Today CT scan images are personally reviewed and counseled with patient. The 4 mm nodular density adjacent to a pneumatocele has not significantly changed in size. The pneumatocele has increased in size a few millimeters. The left upper lobe area of previous resection shows only postoperative changes. Overall there is no significant changes in the CT scan and the right upper lobe nodule is not large enough to further evaluate with imaging or biopsy.  Past Medical History:  Diagnosis Date  . COPD (chronic obstructive pulmonary disease) ([place])   . Hearing loss   . Hypertension   . Shortness of breath dyspnea    on exertion  . Spinal stenosis     Past Surgical History:  Procedure Laterality Date  . BAHA REVISION Right 08/30/2014   Procedure: BONE ANCHORED HEARING AID (BAHA) REVISION RIGHT SIDE;  Surgeon: Izora Gala, MD;  Location: [facility];  Service: ENT;  Laterality: Right;  . BONE ANCHORED HEARING AID IMPLANT  08/03/2012   Procedure: BONE ANCHORED HEARING AID (BAHA) IMPLANT;  Surgeon: Izora Gala, MD;  Location: [facility];  Service: ENT;  Laterality: Right;  . LEG SURGERY     right leg deep cut  . STAPEDES SURGERY     bilateral  . VIDEO ASSISTED THORACOSCOPY (VATS)/ LOBECTOMY Left 10/09/2014   Procedure: VIDEO ASSISTED THORACOSCOPY (VATS)/ LOBECTOMY;  Surgeon: Ivin Poot, MD;  Location:  [facility];  Service: Thoracic;  Laterality: Left;    No family history on file.  Social History Social History  Substance Use Topics  . Smoking status: Former Smoker    Packs/day: 1.00    Years: 35.00    Types: Cigarettes    Quit date: 05/11/2014  . Smokeless tobacco: Never Used  . Alcohol use No    Current Outpatient Prescriptions  Medication Sig Dispense Refill  . hydrochlorothiazide (HYDRODIURIL) 25 MG tablet Take 1 tablet (25 mg total) by mouth daily. 30 tablet 2  . budesonide-formoterol (SYMBICORT) 160-4.5 MCG/ACT inhaler Inhale 2 puffs into the lungs 2 (two) times daily. 1 Inhaler 1   No current facility-administered medications for this visit.     No Known Allergies  Review of Systems  Weight stable No productive cough No headache or syncope No chest pain or palpitation No edema No abdominal pain or weight loss  BP 124/85 (BP Location: Left Arm, Patient Position: Sitting, Cuff Size: Normal)   Pulse (!) 55   Resp 16   Ht 5\' 5"  (1.651 m)   Wt 123 lb 8 oz (56 kg)   SpO2 97% Comment: ON RA  BMI 20.55 kg/m  Physical Exam      Exam    General- alert and comfortable   Lungs- clear without rales, wheezes. Breath sounds are diminished. Left thoracotomy scar well healed.   Cor- regular rate and rhythm, no murmur , gallop   Abdomen- soft, non-tender  Extremities - warm, non-tender, minimal edema   Neuro- oriented, appropriate, no focal weakness   Diagnostic Tests: CT scan images showing no sig change in right upper lobe 4 mm nodular density adjacent to a cystic structure-pneumatocele which has slightly increased in size  Impression: History of lung cancer status post resection of 1.8 cm adenocarcinoma from the left upper lobe 2015  Stable 4 mm density in right upper lobe we'll continue to follow with CT scans.   Plan: Return in 6 months with CT scan of chest.  [name], MD [facility] [phone]

## 2017-08-17 DIAGNOSIS — E559 Vitamin D deficiency, unspecified: Secondary | ICD-10-CM | POA: Diagnosis not present

## 2017-08-17 DIAGNOSIS — R972 Elevated prostate specific antigen [PSA]: Secondary | ICD-10-CM | POA: Diagnosis not present

## 2017-08-17 DIAGNOSIS — J449 Chronic obstructive pulmonary disease, unspecified: Secondary | ICD-10-CM | POA: Diagnosis not present

## 2017-08-17 DIAGNOSIS — C3432 Malignant neoplasm of lower lobe, left bronchus or lung: Secondary | ICD-10-CM | POA: Diagnosis not present

## 2017-08-17 DIAGNOSIS — I10 Essential (primary) hypertension: Secondary | ICD-10-CM | POA: Diagnosis not present

## 2017-08-17 DIAGNOSIS — Z86010 Personal history of colonic polyps: Secondary | ICD-10-CM | POA: Diagnosis not present

## 2017-10-06 DIAGNOSIS — R351 Nocturia: Secondary | ICD-10-CM | POA: Diagnosis not present

## 2017-10-06 DIAGNOSIS — N403 Nodular prostate with lower urinary tract symptoms: Secondary | ICD-10-CM | POA: Diagnosis not present

## 2017-10-06 DIAGNOSIS — R972 Elevated prostate specific antigen [PSA]: Secondary | ICD-10-CM | POA: Diagnosis not present

## 2017-11-04 ENCOUNTER — Other Ambulatory Visit: Payer: Self-pay

## 2017-11-04 DIAGNOSIS — R918 Other nonspecific abnormal finding of lung field: Secondary | ICD-10-CM

## 2017-11-11 DIAGNOSIS — C61 Malignant neoplasm of prostate: Secondary | ICD-10-CM | POA: Diagnosis not present

## 2017-11-11 DIAGNOSIS — R972 Elevated prostate specific antigen [PSA]: Secondary | ICD-10-CM | POA: Diagnosis not present

## 2017-11-17 DIAGNOSIS — C61 Malignant neoplasm of prostate: Secondary | ICD-10-CM | POA: Diagnosis not present

## 2017-11-24 ENCOUNTER — Ambulatory Visit
Admission: RE | Admit: 2017-11-24 | Discharge: 2017-11-24 | Disposition: A | Discharge status: Home / Self Care | Payer: Medicare Other | Source: Ambulatory Visit | Attending: Cardiothoracic Surgery | Admitting: Cardiothoracic Surgery

## 2017-11-24 ENCOUNTER — Ambulatory Visit: Payer: Medicare Other | Admitting: Cardiothoracic Surgery

## 2017-11-24 DIAGNOSIS — R911 Solitary pulmonary nodule: Secondary | ICD-10-CM | POA: Diagnosis not present

## 2017-11-24 DIAGNOSIS — R918 Other nonspecific abnormal finding of lung field: Secondary | ICD-10-CM

## 2017-11-24 MED ORDER — IOPAMIDOL (ISOVUE-300) INJECTION 61%
75.0000 mL | Freq: Once | INTRAVENOUS | Status: AC | PRN
  Administered 2017-11-24: 75 mL via INTRAVENOUS

## 2017-11-26 ENCOUNTER — Encounter: Payer: Self-pay | Admitting: Cardiothoracic Surgery

## 2017-11-26 ENCOUNTER — Ambulatory Visit (INDEPENDENT_AMBULATORY_CARE_PROVIDER_SITE_OTHER): Payer: Medicare Other | Admitting: Cardiothoracic Surgery

## 2017-11-26 ENCOUNTER — Other Ambulatory Visit: Payer: Self-pay

## 2017-11-26 VITALS — BP 125/72 | HR 64 | Resp 18 | Ht 65.0 in | Wt 126.0 lb

## 2017-11-26 DIAGNOSIS — R911 Solitary pulmonary nodule: Secondary | ICD-10-CM | POA: Diagnosis not present

## 2017-11-26 NOTE — Progress Notes (Signed)
PCP is Cari Caraway, MD Referring Provider is Izora Gala, MD  Chief Complaint  Patient presents with  . Lung Cancer    6 month f/u with chest CT 11/24/2017    HPI: The patient returns with scheduled CT scan of chest to follow a small nodule in the right middle lobe. The patient is status post left upper lobe wedge resection of a small 1.8 cm adenocarcinoma in 2015. He stopped smoking that time.  He currently is being followed by a small nodule in the posterior right middle lobe which is adjacent to a pneumatocele. It currently measures 6.5 mm. 6 months ago it measured approximately 5 mm. There is no associated  Mediastinal  adenopathy. The nodule remains small, too small to biopsy or to assess on PET scan for malignant potential. It has however increased in size slowly since 2016 when it measured 2 mm.  Past Medical History:  Diagnosis Date  . COPD (chronic obstructive pulmonary disease) ([place])   . Hearing loss   . Hypertension   . Shortness of breath dyspnea    on exertion  . Spinal stenosis     Past Surgical History:  Procedure Laterality Date  . BAHA REVISION Right 08/30/2014   Procedure: BONE ANCHORED HEARING AID (BAHA) REVISION RIGHT SIDE;  Surgeon: Izora Gala, MD;  Location: [facility];  Service: ENT;  Laterality: Right;  . BONE ANCHORED HEARING AID IMPLANT  08/03/2012   Procedure: BONE ANCHORED HEARING AID (BAHA) IMPLANT;  Surgeon: Izora Gala, MD;  Location: [facility];  Service: ENT;  Laterality: Right;  . LEG SURGERY     right leg deep cut  . STAPEDES SURGERY     bilateral  . VIDEO ASSISTED THORACOSCOPY (VATS)/ LOBECTOMY Left 10/09/2014   Procedure: VIDEO ASSISTED THORACOSCOPY (VATS)/ LOBECTOMY;  Surgeon: Ivin Poot, MD;  Location: [facility];  Service: Thoracic;  Laterality: Left;    History reviewed. No pertinent family history.  Social History Social History   Tobacco Use  . Smoking status: Former Smoker    Packs/day: 1.00    Years: 35.00    Pack years: 35.00   Types: Cigarettes    Last attempt to quit: 05/11/2014    Years since quitting: 3.5  . Smokeless tobacco: Never Used  Substance Use Topics  . Alcohol use: No  . Drug use: No    Current Outpatient Medications  Medication Sig Dispense Refill  . budesonide-formoterol (SYMBICORT) 160-4.5 MCG/ACT inhaler Inhale 2 puffs into the lungs 2 (two) times daily. 1 Inhaler 1  . hydrochlorothiazide (HYDRODIURIL) 25 MG tablet Take 1 tablet (25 mg total) by mouth daily. 30 tablet 2   No current facility-administered medications for this visit.     No Known Allergies  Review of Systems  Weight increased 3 pounds since last visit No headache or change in vision No difficulty swallowing No hemoptysis or chest pain No palpitation or ankle edema No abdominal pain or jaundice No syncope or falls  BP 125/72 (BP Location: Left Arm, Patient Position: Sitting, Cuff Size: Normal)   Pulse 64   Resp 18   Ht 5\' 5"  (1.651 m)   Wt 126 lb (57.2 kg)   SpO2 95% Comment: RA  BMI 20.97 kg/m  Physical Exam      Exam    General- alert and comfortable    Neck- no JVD, no cervical adenopathy palpable, no carotid bruit   Lungs- clear without rales, wheezes. Well-healed left VATS scar   Cor- regular rate and rhythm, no  murmur , gallop   Abdomen- soft, non-tender   Extremities - warm, non-tender, minimal edema   Neuro- oriented, appropriate, no focal weakness  Diagnostic Tests: CT scan images personally reviewed and counseled with patient The small nodule in the posterior segment of the right middle lobe adjacent to a pneumatocele has shown very small increase in size of 1.5 mm in 6 months. He remains small, approximate 7 mm and too small to biopsy.  Patient is asymptomatic and I recommended continuing surveillance scans with repeat study scheduled in 6 months.  Impression: Slowly enlarging right middle lobe nodule from 2.0 mm in 2016 now 7.0 mm in 2019. The nodule is too small to biopsy or to assess at with  PET scan. It is too small for resection without diagnosis of malignancy. We'll continue to 0 scans  Plan: Return with CT scan of chest in 6 months   [name], MD [facility] [phone]

## 2017-11-30 DIAGNOSIS — C61 Malignant neoplasm of prostate: Secondary | ICD-10-CM | POA: Diagnosis not present

## 2017-12-01 ENCOUNTER — Encounter: Payer: Self-pay | Admitting: Radiation Oncology

## 2017-12-09 ENCOUNTER — Encounter: Payer: Self-pay | Admitting: Radiation Oncology

## 2017-12-09 ENCOUNTER — Ambulatory Visit
Admission: RE | Admit: 2017-12-09 | Discharge: 2017-12-09 | Disposition: A | Discharge status: Home / Self Care | Payer: Medicare Other | Source: Ambulatory Visit | Attending: Radiation Oncology | Admitting: Radiation Oncology

## 2017-12-09 ENCOUNTER — Other Ambulatory Visit: Payer: Self-pay | Admitting: Urology

## 2017-12-09 ENCOUNTER — Other Ambulatory Visit: Payer: Self-pay

## 2017-12-09 ENCOUNTER — Encounter: Payer: Self-pay | Admitting: Medical Oncology

## 2017-12-09 VITALS — BP 135/90 | HR 67 | Temp 98.0°F | Resp 18 | Ht 65.0 in | Wt 124.4 lb

## 2017-12-09 DIAGNOSIS — Z79899 Other long term (current) drug therapy: Secondary | ICD-10-CM | POA: Diagnosis not present

## 2017-12-09 DIAGNOSIS — C61 Malignant neoplasm of prostate: Secondary | ICD-10-CM | POA: Insufficient documentation

## 2017-12-09 DIAGNOSIS — J449 Chronic obstructive pulmonary disease, unspecified: Secondary | ICD-10-CM | POA: Diagnosis not present

## 2017-12-09 DIAGNOSIS — N401 Enlarged prostate with lower urinary tract symptoms: Secondary | ICD-10-CM | POA: Diagnosis not present

## 2017-12-09 DIAGNOSIS — Z85118 Personal history of other malignant neoplasm of bronchus and lung: Secondary | ICD-10-CM | POA: Diagnosis not present

## 2017-12-09 DIAGNOSIS — I10 Essential (primary) hypertension: Secondary | ICD-10-CM | POA: Insufficient documentation

## 2017-12-09 DIAGNOSIS — Z87891 Personal history of nicotine dependence: Secondary | ICD-10-CM | POA: Diagnosis not present

## 2017-12-09 DIAGNOSIS — R972 Elevated prostate specific antigen [PSA]: Secondary | ICD-10-CM | POA: Diagnosis not present

## 2017-12-09 HISTORY — DX: Malignant neoplasm of prostate: C61

## 2017-12-09 HISTORY — DX: Malignant neoplasm of unspecified part of unspecified bronchus or lung: C34.90

## 2017-12-09 MED ORDER — SULFAMETHOXAZOLE-TRIMETHOPRIM 800-160 MG PO TABS: 1.0000 | ORAL_TABLET | Freq: Two times a day (BID) | ORAL | 0 refills | Status: DC

## 2017-12-09 NOTE — Progress Notes (Signed)
Introduced myself to Kyle Hamilton as the prostate nurse navigator and my role. He states that he has a history of lung cancer and now prostate cancer. He had lung surgery in 2015 by Dr. Prescott Gum to treat his lung cancer. He continues with CT's and follow up every 6 months. He states he is interested in brachytherapy for treatment of prostate cancer. We discussed the procedure and follow up. I gave him my business card and asked him to call me with questions or concerns. He voiced understanding.

## 2017-12-09 NOTE — Progress Notes (Signed)
Radiation Oncology         [phone]) [phone] ________________________________  Initial outpatient Consultation  Name: Kyle Hamilton MRN: [id number]  Date: 12/09/2017  DOB: 11-24-1955  CV:ELFYBOF, Abigail Butts, MD  Kathie Rhodes, MD   REFERRING PHYSICIAN: Kathie Rhodes, MD  DIAGNOSIS: 62 y.o. gentleman with stage T2c adenocarcinoma of the prostate with a Gleason's score of 3+4=7 and a PSA of 7.7    ICD-10-CM   1. Malignant neoplasm of prostate ([place]) C61     HISTORY OF PRESENT ILLNESS::Kyle Hamilton is a 62 y.o. gentleman.  He was noted to have an elevated PSA of 7.7 in October 2018 by his primary care physician, Dr. Addison Lank. He has a hx of elevated PSA of 5.8 on 07/26/2017 and 5.06 a year prior. Accordingly, he was referred for evaluation in urology by Dr. Karsten Ro on 10/06/17,  digital rectal examination was performed at that time revealing a firm, slightly raised nodule located in the apex of his prostate laterally and extending toward the mid prostate, approximately 1.5 cm in greatest dimension.  The patient proceeded to transrectal ultrasound with 12 biopsies of the prostate on 11/11/17. The prostate volume measured 41.68 cc.  Out of 12 core biopsies,9 were positive.  The maximum Gleason score was 3+4=7, and this was seen in 6/6 cores on the R and 1 core on the left. Additionally, there were two cores on the right with Gleason 3+3 disease.  The patient reviewed the biopsy results with his urologist and he has kindly been referred today for discussion of potential radiation treatment options.   Of note, the patient has a history of  LUL non-small cell lung cancer, adenocarcinoma in 10/09/14 with wedge resection. Nodes and margins were negative for malignancy. He is EGFR positive. Dr. Prescott Gum is following a small RML lesion (6.5 mm). Serial CT scans show slow growth since first found at 2 mm.  He remains under close observation with serial CT scans every 6 months.  PREVIOUS RADIATION THERAPY: No  PAST  MEDICAL HISTORY:  has a past medical history of COPD (chronic obstructive pulmonary disease) ([place]), Hearing loss, Hypertension, Lung cancer ([place]), Prostate cancer ([place]), Shortness of breath dyspnea, and Spinal stenosis.    PAST SURGICAL HISTORY: Past Surgical History:  Procedure Laterality Date  . BAHA REVISION Right 08/30/2014   Procedure: BONE ANCHORED HEARING AID (BAHA) REVISION RIGHT SIDE;  Surgeon: Izora Gala, MD;  Location: [place] [facility];  Service: ENT;  Laterality: Right;  . BONE ANCHORED HEARING AID IMPLANT  08/03/2012   Procedure: BONE ANCHORED HEARING AID (BAHA) IMPLANT;  Surgeon: Izora Gala, MD;  Location: [facility];  Service: ENT;  Laterality: Right;  . LEG SURGERY     right leg deep cut  . PROSTATE BIOPSY    . STAPEDES SURGERY     bilateral  . VIDEO ASSISTED THORACOSCOPY (VATS)/ LOBECTOMY Left 10/09/2014   Procedure: VIDEO ASSISTED THORACOSCOPY (VATS)/ LOBECTOMY;  Surgeon: Ivin Poot, MD;  Location: [facility];  Service: Thoracic;  Laterality: Left;    FAMILY HISTORY: family history is not on file.  SOCIAL HISTORY:  reports that he quit smoking about 3 years ago. His smoking use included cigarettes. He has a 35.00 pack-year smoking history. he has never used smokeless tobacco. He reports that he does not drink alcohol or use drugs.  ALLERGIES: Patient has no known allergies.  MEDICATIONS:  Current Outpatient Medications  Medication Sig Dispense Refill  . budesonide-formoterol (SYMBICORT) 160-4.5 MCG/ACT inhaler Inhale 2 puffs into the lungs 2 (two) times  daily. 1 Inhaler 1  . hydrochlorothiazide (MICROZIDE) 12.5 MG capsule TK 1 C PO QD IN THE MORNING  1  . sulfamethoxazole-trimethoprim (BACTRIM DS,SEPTRA DS) 800-160 MG tablet Take 1 tablet by mouth 2 (two) times daily. 20 tablet 0   No current facility-administered medications for this encounter.     REVIEW OF SYSTEMS:  On review of systems, the patient reports that he is doing well overall. He denies any chest pain, shortness of  breath, cough, fevers, chills, night sweats, unintended weight changes. He denies any bowel or bladder disturbances, and denies abdominal pain, nausea or vomiting. He has noticed a "small bump" on his LLQ abd/groin that has been present for several weeks but is now more red and has become tender to palpate over the past 2 days.  He has not had any drainage from this lesion and denies associated fever or chills.  He denies any new musculoskeletal or joint aches or pains, new skin lesions or concerns.  His IPSS score is 10 indicating moderate urinary outflow obstructive symptoms.  He indicated that his erectile function is unable to complete sexual activity on almost all attempts. A complete review of systems is obtained and is otherwise negative.   PHYSICAL EXAM:   height is '5\' 5"'  (1.651 m) and weight is 124 lb 6.4 oz (56.4 kg). His oral temperature is 98 F (36.7 C). His blood pressure is 135/90 and his pulse is 67. His respiration is 18 and oxygen saturation is 97%.   In general this is a well appearing Caucasian male in no acute distress. He is alert and oriented x4 and appropriate throughout the examination. HEENT reveals that the patient is normocephalic, atraumatic. EOMs are intact. PERRLA. Skin is intact without any evidence of gross lesions. Cardiovascular exam reveals a regular rate and rhythm, no clicks rubs or murmurs are auscultated. Chest is clear to auscultation bilaterally. Lymphatic assessment is performed and does not reveal any adenopathy in the cervical, supraclavicular, axillary, or inguinal chains. Abdomen has active bowel sounds in all quadrants and is intact. The abdomen is soft, non tender, non distended. There is a 1.5cm erythematous papule in the left pubic region that is tender to palpation.  It is mildly fluctuant but there is no drainage or bleeding and no surrounding streaking.  Lower extremities are negative for pretibial pitting edema, deep calf tenderness, cyanosis or  clubbing.  KPS = 90  100 - Normal; no complaints; no evidence of disease. 90   - Able to carry on normal activity; minor signs or symptoms of disease. 80   - Normal activity with effort; some signs or symptoms of disease. 86   - Cares for self; unable to carry on normal activity or to do active work. 60   - Requires occasional assistance, but is able to care for most of his personal needs. 50   - Requires considerable assistance and frequent medical care. 68   - Disabled; requires special care and assistance. 24   - Severely disabled; hospital admission is indicated although death not imminent. 52   - Very sick; hospital admission necessary; active supportive treatment necessary. 10   - Moribund; fatal processes progressing rapidly. 0     - Dead  Karnofsky DA, Abelmann WH, Craver LS and [facility]al [facility] [phone]) The use of the nitrogen mustards in the palliative treatment of carcinoma: with particular reference to bronchogenic carcinoma Cancer 1 634-56   LABORATORY DATA:  Lab Results  Component Value Date   WBC 14.6 (  H) 08/13/2016   HGB 16.0 08/13/2016   HCT 45.5 08/13/2016   MCV 85.4 08/13/2016   PLT 242 08/13/2016   Lab Results  Component Value Date   NA 136 08/13/2016   K 3.4 (L) 08/13/2016   CL 93 (L) 08/13/2016   CO2 31 08/13/2016   Lab Results  Component Value Date   ALT 26 08/13/2016   AST 28 08/13/2016   ALKPHOS 66 08/13/2016   BILITOT 1.8 (H) 08/13/2016     RADIOGRAPHY: Ct Chest W Contrast  Result Date: 11/24/2017 CLINICAL DATA:  History of lung cancer. Lung mass. New diagnosis of prostate cancer. EXAM: CT CHEST WITH CONTRAST TECHNIQUE: Multidetector CT imaging of the chest was performed during intravenous contrast administration. CONTRAST:  36m ISOVUE-300 IOPAMIDOL (ISOVUE-300) INJECTION 61% COMPARISON:  06/02/2017. FINDINGS: Creatinine was obtained on site at [facility] [address] Results: Creatinine 0.6 mg/dL. Cardiovascular: The heart size is  normal. No pericardial effusion. Coronary artery calcification is evident. Atherosclerotic calcification is noted in the wall of the thoracic aorta. Mediastinum/Nodes: No mediastinal lymphadenopathy. There is no hilar lymphadenopathy. Dense calcification identified in the mediastinum at the level of the carina may be a calcified lymph node or calcified lesion in the wall of the esophagus. This is unchanged in the interval in stable comparing back to a study from 07/14/2015. There is no axillary lymphadenopathy. Lungs/Pleura: Centrilobular emphysema noted. 3 mm peripheral right upper lobe pulmonary nodule Unchanged in the interval. The posterior right middle lobe nodule has increased slightly in the interval, measuring 7 mm today compared to 5 mm previously. This same nodule measured only about 2 mm on a study from 07/24/2015. Similar appearance of the adjacent air cyst. Stable surgical scarring posterior left lung. Diffuse bronchial wall thickening evident bilaterally. Upper Abdomen: Scattered tiny hypoattenuating lesions in the liver are similar to 07/24/2015 with probable 2.8 cm cavernous hemangioma in the dome of the lateral segment left liver, also not substantially changed since 2016. No adrenal nodule or mass. Musculoskeletal: Bone windows reveal no worrisome lytic or sclerotic osseous lesions. IMPRESSION: 1. Continued progression of the posterior right middle lobe nodule along the wall of a small air cyst. Soft tissue component measures up to 7 mm on today's exam. Given the continued progression, neoplasm is a growing concern. Continued close follow-up suggested. 2. Stable surgical scarring posterior left lung with diffuse bronchial wall thickening bilaterally. 3.  Emphysema. (ICD10-J43.9) 4.  Aortic Atherosclerois (ICD10-170.0) Electronically Signed   By: EMisty StanleyM.D.   On: 11/24/2017 13:49      IMPRESSION: 62y.o. gentleman with stage T2c adenocarcinoma of the prostate with a Gleason's score of  3+4=7 and a PSA of 7.7.  His Gleason's Score puts him into the favorable intermediate risk group.  Accordingly he is eligible for treatment options including prostatectomy, prostate brachytherapy and external beam radiation.  PLAN: Today, Dr. MTammi Klippeland  I reviewed the findings and workup thus far.  We discussed the natural history of prostate cancer.  We reviewed the the implications of T-stage, Gleason's Score, and PSA on decision-making and outcomes in prostate cancer.  We discussed radiation treatment in the management of prostate cancer with regard to the logistics and delivery of external beam radiation treatment as well as the logistics and delivery of prostate brachytherapy.  We compared and contrasted each of these approaches and also compared these against prostatectomy.  The patient is not felt to be an ideal surgical candidate given his history of lung cancer  requiring wedge resection and known underlying COPD.  He is most interested in brachytherapy.  He was encouraged to ask questions that were answered to his stated satisfaction.  At the conclusion of our conversation, the patient is interested in moving forward with brachytherapy and use of SpaceOAR to reduce rectal toxicity from radiotherapy.  We will share our discussion with Dr. Karsten Ro and move forward with scheduling his CT [facility] planning appointment in the near future.  The patient met briefly with Romie Jumper in our office who will be working closely with him to coordinate OR scheduling and pre and post procedure appointments.  We will contact the pharmaceutical rep to ensure that [employer] [employer] is available at the time of procedure.  He will have a prostate MRI following his post-seed CT SIM to confirm appropriate distribution of the [place].  He appears to have a small folliculitis in the left groin which I will treat with Septra DS x 10 days.  I also advised that he use warm compresses 2-3x.day to encourage drainage and decrease  tenderness.  He knows to seek further evaluation with his PCP should this not improve on antibiotics or should this progressively worsen despite treatment.  He states his understanding and compliance.  We enjoyed meeting with him today, and will look forward to participating in the care of this very nice gentleman.  We spent 60 minutes face to face with the patient and more than 50% of that time was spent in counseling and/or coordination of care.   ------------------------------------------------  Sheral Apley. Tammi Klippel, M.D.  This document serves as a record of services personally performed by [name], MD and Freeman Caldron, PA-C. It was created on their behalf by Linward Natal, a trained medical scribe. The creation of this record is based on the scribe's personal observations and the provider's statements to them. This document has been checked and approved by the attending provider.

## 2017-12-09 NOTE — Progress Notes (Signed)
See progress note under physician encounter. 

## 2017-12-09 NOTE — Progress Notes (Signed)
GU Location of Tumor / Histology: prostatic adenocarcinoma  If Prostate Cancer, Gleason Score is (3 + 4) and PSA is (7.7). Prostate volume: 41.68 grams   Kyle Hamilton was referred by Dr. Cari Caraway for further evaluation of an elevated PSA in November 2018  Biopsies of prostate (if applicable) revealed:    Past/Anticipated interventions by urology, if any: prostate biopsy, referral to radiation oncology  Past/Anticipated interventions by medical oncology, if any: no  Weight changes, if any: no  Bowel/Bladder complaints, if any: Urinary frequency, hesitancy, decreased force of stream and nocturia x 2   Nausea/Vomiting, if any: no  Pain issues, if any:  Chronic low back pain 4 on a scale of 0-10. Denies taking medication to manage this pain.  SAFETY ISSUES:  Prior radiation? no  Pacemaker/ICD? no  Possible current pregnancy? no  Is the patient on methotrexate? no  Current Complaints / other details:  62 year old male. Single. One daughter. Two sons. NKDA. Hx of lung ca.

## 2017-12-11 DIAGNOSIS — C61 Malignant neoplasm of prostate: Secondary | ICD-10-CM | POA: Insufficient documentation

## 2017-12-17 ENCOUNTER — Telehealth: Payer: Self-pay | Admitting: *Deleted

## 2017-12-17 NOTE — Telephone Encounter (Signed)
CALLED PATIENT TO INFORM THAT I DON'T KNOW IMPLANT DATE AS OF 12-17-17, [facility] SCHEDULER IS OUT DUE TO BEING SICK

## 2017-12-30 ENCOUNTER — Telehealth: Payer: Self-pay | Admitting: *Deleted

## 2017-12-30 NOTE — Telephone Encounter (Signed)
CALLED PATIENT TO REMIND OF APPTS. FOR 12-31-17, LVM FOR A RETURN CALL

## 2017-12-31 ENCOUNTER — Inpatient Hospital Stay
Admission: RE | Admit: 2017-12-31 | Discharge: 2017-12-31 | Disposition: A | Discharge status: Home / Self Care | Payer: Self-pay | Source: Ambulatory Visit | Attending: Radiation Oncology | Admitting: Radiation Oncology

## 2017-12-31 ENCOUNTER — Encounter: Payer: Self-pay | Admitting: Medical Oncology

## 2017-12-31 ENCOUNTER — Ambulatory Visit
Admission: RE | Admit: 2017-12-31 | Discharge: 2017-12-31 | Disposition: A | Discharge status: Home / Self Care | Payer: Medicare Other | Source: Ambulatory Visit | Attending: Radiation Oncology | Admitting: Radiation Oncology

## 2017-12-31 ENCOUNTER — Other Ambulatory Visit: Payer: Self-pay | Admitting: Urology

## 2017-12-31 ENCOUNTER — Telehealth: Payer: Self-pay | Admitting: *Deleted

## 2017-12-31 DIAGNOSIS — C61 Malignant neoplasm of prostate: Secondary | ICD-10-CM | POA: Insufficient documentation

## 2017-12-31 DIAGNOSIS — Z510 Encounter for antineoplastic radiation therapy: Secondary | ICD-10-CM | POA: Diagnosis not present

## 2017-12-31 NOTE — Telephone Encounter (Signed)
Called patient to inform of chest x-ray and EKG on 01-03-18 - arrival time - 1:45 pm @ [facility] Admitting, spoke with patient's mother - Kyle Hamilton and she will inform her son of this appt.

## 2017-12-31 NOTE — Progress Notes (Signed)
  Radiation Oncology         [phone]) [phone] ________________________________  Name: Kyle Hamilton MRN: [id number]  Date: 12/31/2017  DOB: 1956-02-28  SIMULATION AND TREATMENT PLANNING NOTE PUBIC ARCH STUDY  [id number], Abigail Butts, MD  Kathie Rhodes, MD  DIAGNOSIS: 62 y.o. gentleman with stage T2c adenocarcinoma of the prostate with a Gleason's score of 3+4=7 and a PSA of 7.7     ICD-10-CM   1. Malignant neoplasm of prostate ([place]) C61     COMPLEX SIMULATION:  The patient presented today for evaluation for possible prostate seed implant. He was brought to the radiation planning suite and placed supine on the CT couch. A 3-dimensional image study set was obtained in upload to the planning computer. There, on each axial slice, I contoured the prostate gland. Then, using three-dimensional radiation planning tools I reconstructed the prostate in view of the structures from the transperineal needle pathway to assess for possible pubic arch interference. In doing so, I did not appreciate any pubic arch interference. Also, the patient's prostate volume was estimated based on the drawn structure. The volume was 33 cc.  Given the pubic arch appearance and prostate volume, patient remains a good candidate to proceed with prostate seed implant. Today, he freely provided informed written consent to proceed.    PLAN: The patient will undergo prostate seed implant.   ________________________________  Sheral Apley. Tammi Klippel, M.D.

## 2018-01-03 ENCOUNTER — Other Ambulatory Visit: Payer: Self-pay | Admitting: Urology

## 2018-01-03 ENCOUNTER — Inpatient Hospital Stay (HOSPITAL_COMMUNITY): Admission: RE | Admit: 2018-01-03 | Payer: Medicare Other | Source: Ambulatory Visit

## 2018-01-03 NOTE — Progress Notes (Signed)
NOTED PT HAD APPT TODAY AT PST FOR CXR AND EKG.  PT DID NOT SHOW.  CALLED AND LM FOR SHIRLEY , DR MANNING OFFICE, TO LET HER KNOW THIS BUT ALSO LET HER KNOW THAT PT DOES NEED CXR SINCE HE HAD CHEST CT DONE 01/ 2019 IN Epic.  BUT WILL NEED TO RESCHEDULE TO DO EKG.

## 2018-01-04 ENCOUNTER — Telehealth: Payer: Self-pay | Admitting: *Deleted

## 2018-01-04 NOTE — Telephone Encounter (Signed)
CALLED PATIENT'S MOM, NEEDING HER SON Kyle Hamilton TO CALL ME, LVM  FOR A RETURN CALL

## 2018-01-05 ENCOUNTER — Telehealth: Payer: Self-pay | Admitting: *Deleted

## 2018-01-05 NOTE — Telephone Encounter (Signed)
Called patient's mother-Carolyn Norland, and lvm for her son to call me, due to the fact that no vm is set up on his cell phone

## 2018-01-25 ENCOUNTER — Encounter (HOSPITAL_COMMUNITY)
Admission: RE | Admit: 2018-01-25 | Discharge: 2018-01-25 | Disposition: A | Discharge status: Home / Self Care | Payer: Medicare Other | Source: Ambulatory Visit | Attending: Urology | Admitting: Urology

## 2018-01-25 DIAGNOSIS — C61 Malignant neoplasm of prostate: Secondary | ICD-10-CM | POA: Insufficient documentation

## 2018-01-25 DIAGNOSIS — Z01810 Encounter for preprocedural cardiovascular examination: Secondary | ICD-10-CM | POA: Diagnosis not present

## 2018-01-25 DIAGNOSIS — R001 Bradycardia, unspecified: Secondary | ICD-10-CM | POA: Insufficient documentation

## 2018-02-09 ENCOUNTER — Ambulatory Visit (HOSPITAL_COMMUNITY): Admission: RE | Admit: 2018-02-09 | Payer: Medicare Other | Source: Ambulatory Visit

## 2018-02-15 DIAGNOSIS — E559 Vitamin D deficiency, unspecified: Secondary | ICD-10-CM | POA: Diagnosis not present

## 2018-02-15 DIAGNOSIS — C3432 Malignant neoplasm of lower lobe, left bronchus or lung: Secondary | ICD-10-CM | POA: Diagnosis not present

## 2018-02-15 DIAGNOSIS — Z86010 Personal history of colonic polyps: Secondary | ICD-10-CM | POA: Diagnosis not present

## 2018-02-15 DIAGNOSIS — C61 Malignant neoplasm of prostate: Secondary | ICD-10-CM | POA: Diagnosis not present

## 2018-02-15 DIAGNOSIS — I10 Essential (primary) hypertension: Secondary | ICD-10-CM | POA: Diagnosis not present

## 2018-03-16 ENCOUNTER — Telehealth: Payer: Self-pay | Admitting: *Deleted

## 2018-03-16 NOTE — Telephone Encounter (Signed)
Called patient to inform of lab work for 03/18/18 - arrival time - 1:45 pm, spoke with patient and he is aware of this appt.

## 2018-03-17 ENCOUNTER — Telehealth: Payer: Self-pay | Admitting: *Deleted

## 2018-03-17 ENCOUNTER — Encounter (HOSPITAL_BASED_OUTPATIENT_CLINIC_OR_DEPARTMENT_OTHER): Payer: Self-pay

## 2018-03-17 NOTE — Telephone Encounter (Signed)
CALLED PATIENT TO REMIND OF LAB APPT. FOR 03-18-18, NO ANSWER

## 2018-03-18 ENCOUNTER — Encounter (HOSPITAL_BASED_OUTPATIENT_CLINIC_OR_DEPARTMENT_OTHER): Payer: Self-pay

## 2018-03-18 ENCOUNTER — Encounter (HOSPITAL_COMMUNITY)
Admission: RE | Admit: 2018-03-18 | Discharge: 2018-03-18 | Disposition: A | Discharge status: Home / Self Care | Payer: Medicare Other | Source: Ambulatory Visit | Attending: Urology | Admitting: Urology

## 2018-03-18 ENCOUNTER — Other Ambulatory Visit: Payer: Self-pay

## 2018-03-18 DIAGNOSIS — Z01812 Encounter for preprocedural laboratory examination: Secondary | ICD-10-CM | POA: Insufficient documentation

## 2018-03-18 LAB — CBC
HCT: 44.4 % (ref 39.0–52.0)
Hemoglobin: 14.8 g/dL (ref 13.0–17.0)
MCH: 30.9 pg (ref 26.0–34.0)
MCHC: 33.3 g/dL (ref 30.0–36.0)
MCV: 92.7 fL (ref 78.0–100.0)
Platelets: 284 10*3/uL (ref 150–400)
RBC: 4.79 MIL/uL (ref 4.22–5.81)
RDW: 13.1 % (ref 11.5–15.5)
WBC: 6.2 10*3/uL (ref 4.0–10.5)

## 2018-03-18 LAB — COMPREHENSIVE METABOLIC PANEL
ALT: 13 U/L — ABNORMAL LOW (ref 17–63)
AST: 24 U/L (ref 15–41)
Albumin: 4.1 g/dL (ref 3.5–5.0)
Alkaline Phosphatase: 63 U/L (ref 38–126)
Anion gap: 12 (ref 5–15)
BUN: 12 mg/dL (ref 6–20)
CO2: 28 mmol/L (ref 22–32)
Calcium: 9.3 mg/dL (ref 8.9–10.3)
Chloride: 102 mmol/L (ref 101–111)
Creatinine, Ser: 0.67 mg/dL (ref 0.61–1.24)
GFR calc Af Amer: >60 mL/min (ref >60)
GFR calc non Af Amer: >60 mL/min (ref >60)
Glucose, Bld: 100 mg/dL — ABNORMAL HIGH (ref 65–99)
Potassium: 3.5 mmol/L (ref 3.5–5.1)
Sodium: 142 mmol/L (ref 135–145)
Total Bilirubin: 0.9 mg/dL (ref 0.3–1.2)
Total Protein: 7.1 g/dL (ref 6.5–8.1)

## 2018-03-18 LAB — APTT: aPTT: 33 seconds (ref 24–36)

## 2018-03-18 LAB — PROTIME-INR
INR: 1.05
Prothrombin Time: 13.6 seconds (ref 11.4–15.2)

## 2018-03-18 NOTE — Progress Notes (Signed)
Spoke with:  Kyle Hamilton NPO:  After Midnight, no gum, candy, or mints  Arrival time:  0530AM Labs/Procedures:   EKG/CXR 01/25/2018, CBC, CMP, PT, PTT 03/18/2018 in epic AM medications:  None, Bring Asthma Inhaler day of surgery Pre op orders:  Yes Ride home:  Kyle Hamilton (friend) [phone]

## 2018-03-24 ENCOUNTER — Telehealth: Payer: Self-pay | Admitting: *Deleted

## 2018-03-24 NOTE — Telephone Encounter (Signed)
CALLED PATIENT TO REMIND OF PROCEDURE FOR 03-25-18, LVM FOR A RETURN CALL

## 2018-03-24 NOTE — H&P (Signed)
HPI: Kyle Hamilton is a 62 year-old male with newly diagnosed prostate cancer.  His prostate cancer was diagnosed 11/11/2017. His PSA at his time of diagnosis was 7.7. His cancer was T2c, Gleason 3+4 = 7 in 7 cores with all of the right lobe involved and 3+3 = 6 in 2 cores from the left lobe for a total of 9/12 cores positive..   11/17/17: He reports having had no complications or difficulties following his prostate biopsy.   Adenocarcinoma the prostate: He was found to have a PSA of 7.7 and noted to have a nodule in the right apex. No family history of prostate cancer.  TRUS/Bx 11/11/17: Prostate vol. - 42 cc  Pathology: Gleason 3+4 = 7 in 7 cores with all of the right lobe involved and 3+3 = 6 in 2 cores from the left lobe for a total of 9/12 cores positive.  Stage: T2c   11/30/17:     ALLERGIES: No Allergies    MEDICATIONS: Hydrochlorothiazide 25 mg tablet  Levaquin 750 mg tablet Take the morning of your prostate biopsy.  Symbicort 160 mcg-4.5 mcg/actuation hfa aerosol with adapter     GU PSH: Prostate Needle Biopsy - 11/11/2017      PSH Notes: Right ear surgery (07/22/2012), Lung surgery (10/09/2014)   NON-GU PSH: Surgical Pathology, Gross And Microscopic Examination For Prostate Needle - 11/11/2017    GU PMH: Prostate Cancer, He is going to review the information that I have given him and then return so we can make a decision on how he would like to proceed with treatment. - 11/17/2017 Elevated PSA, He has an elevated PSA but equally if not more concerning is the firm nodularity noted on his DRE. - 10/06/2017 Nocturia, He does have some frequency and nocturia but does not have any significant obstructive voiding symptoms. - 10/06/2017 Prostate nodule w/ LUTS, Right, He has a firm, slightly raised nodule located in the apex of his prostate laterally and extending toward the mid prostate. - 10/06/2017    NON-GU PMH: Hypertension Lung Cancer, History    FAMILY HISTORY: 1 Daughter -  Daughter 2 sons - Son Heart Attack - Father   SOCIAL HISTORY: Marital Status: Single Preferred Language: English; Ethnicity: Not Hispanic Or Latino; Race: White Current Smoking Status: Patient does not smoke anymore. Has not smoked since 09/09/2014. Smoked for 40 years. Smoked 1 pack per day.   Tobacco Use Assessment Completed: Used Tobacco in last 30 days? Has never drank.  Drinks 2 caffeinated drinks per day.    REVIEW OF SYSTEMS:    GU Review Male:   Patient denies frequent urination, hard to postpone urination, burning/ pain with urination, get up at night to urinate, leakage of urine, stream starts and stops, trouble starting your stream, have to strain to urinate , erection problems, and penile pain.  Gastrointestinal (Upper):   Patient denies nausea, vomiting, and indigestion/ heartburn.  Gastrointestinal (Lower):   Patient denies diarrhea and constipation.  Constitutional:   Patient denies fever, night sweats, weight loss, and fatigue.  Skin:   Patient denies skin rash/ lesion and itching.  Eyes:   Patient denies blurred vision and double vision.  Ears/ Nose/ Throat:   Patient denies sore throat and sinus problems.  Hematologic/Lymphatic:   Patient denies swollen glands and easy bruising.  Cardiovascular:   Patient denies leg swelling and chest pains.  Respiratory:   Patient denies cough and shortness of breath.  Endocrine:   Patient denies excessive thirst.  Musculoskeletal:  Patient denies back pain and joint pain.  Neurological:   Patient denies headaches and dizziness.  Psychologic:   Patient denies depression and anxiety.   VITAL SIGNS:    Weight 128 lb / 58.06 kg  Height 65 in / 165.1 cm  BP 139/81 mmHg  Pulse 73 /min  BMI 21.3 kg/m    GU PHYSICAL EXAMINATION:    Anus and Perineum: No hemorrhoids. No anal stenosis. No rectal fissure, no anal fissure. No edema, no dimple, no perineal tenderness, no anal tenderness.  Scrotum: No lesions. No edema. No cysts. No  warts.  Epididymides: Right: no spermatocele, no masses, no cysts, no tenderness, no induration, no enlargement. Left: no spermatocele, no masses, no cysts, no tenderness, no induration, no enlargement.  Testes: No tenderness, no swelling, no enlargement left testes. No tenderness, no swelling, no enlargement right testes. Normal location left testes. Normal location right testes. No mass, no cyst, no varicocele, no hydrocele left testes. No mass, no cyst, no varicocele, no hydrocele right testes.  Urethral Meatus: Normal size. No lesion, no wart, no discharge, no polyp. Normal location.  Penis: Circumcised, no warts, no cracks. No dorsal Peyronie's plaques, no left corporal Peyronie's plaques, no right corporal Peyronie's plaques, no scarring, no warts. No balanitis, no meatal stenosis.  Prostate: Prostate 2 1/2+ size. Right apex 1.5 mm prostate nodule. Left lobe normal consistency, right lobe normal consistency. Symmetrical lobes. Left lobe no tenderness, right lobe no tenderness.   Seminal Vesicles: Nonpalpable.  Sphincter Tone: Normal sphincter. No rectal tenderness. No rectal mass.    MULTI-SYSTEM PHYSICAL EXAMINATION:    Constitutional: Well-nourished. No physical deformities. Normally developed. Good grooming.  Neck: Neck symmetrical, not swollen. Normal tracheal position.  Respiratory: Mildly labored breathing. No use of accessory muscles.   Cardiovascular: Normal temperature, normal extremity pulses, no swelling, no varicosities.  Lymphatic: No enlargement of neck, axillae, groin.  Skin: No paleness, no jaundice, no cyanosis. No lesion, no ulcer, no rash.  Neurologic / Psychiatric: Oriented to time, oriented to place, oriented to person. No depression, no anxiety, no agitation.  Gastrointestinal: No mass, no tenderness, no rigidity, non obese abdomen.  Eyes: Normal conjunctivae. Normal eyelids.  Ears, Nose, Mouth, and Throat: Left ear no scars, no lesions, no masses. Right ear no scars,  no lesions, no masses. Nose no scars, no lesions, no masses. Normal hearing. Normal lips.  Musculoskeletal: Normal gait and station of head and neck.    PAST DATA REVIEWED:  Source Of History:  Patient   08/17/17 07/30/16 07/11/15  PSA  Total PSA 7.7 ng/dl 5.8 ng/dl 5.06 ng/dl  % Free PSA  6.4 %     PROCEDURES:          Urinalysis w/Scope Dipstick Dipstick Cont'd Micro  Color: Yellow Bilirubin: Neg WBC/hpf: NS (Not Seen)  Appearance: Clear Ketones: Neg RBC/hpf: 0 - 2/hpf  Specific Gravity: 1.015 Blood: 1+ Bacteria: NS (Not Seen)  pH: 8.5 Protein: Neg Cystals: NS (Not Seen)  Glucose: Neg Urobilinogen: 0.2 Casts: NS (Not Seen)    Nitrites: Neg Trichomonas: Not Present    Leukocyte Esterase: Neg Mucous: Not Present      Epithelial Cells: 0 - 5/hpf      Yeast: NS (Not Seen)      Sperm: Not Present    ASSESSMENT/PLAN:     ICD-10 Details  1 GU:   Prostate Cancer - C61 Stable - After having discussed surgery versus radiation he has elected to proceed with radiation therapy and would like  to undergo radioactive seed implant. I think he would be a good candidate.

## 2018-03-25 ENCOUNTER — Ambulatory Visit (HOSPITAL_BASED_OUTPATIENT_CLINIC_OR_DEPARTMENT_OTHER): Payer: Medicare Other | Admitting: Anesthesiology

## 2018-03-25 ENCOUNTER — Ambulatory Visit (HOSPITAL_COMMUNITY): Payer: Medicare Other

## 2018-03-25 ENCOUNTER — Ambulatory Visit (HOSPITAL_BASED_OUTPATIENT_CLINIC_OR_DEPARTMENT_OTHER)
Admission: RE | Admit: 2018-03-25 | Discharge: 2018-03-25 | Disposition: A | Discharge status: Home / Self Care | Payer: Medicare Other | Source: Ambulatory Visit | Attending: Urology | Admitting: Urology

## 2018-03-25 ENCOUNTER — Encounter (HOSPITAL_BASED_OUTPATIENT_CLINIC_OR_DEPARTMENT_OTHER)
Admission: RE | Disposition: A | Discharge status: Home / Self Care | Payer: Self-pay | Source: Ambulatory Visit | Attending: Urology

## 2018-03-25 ENCOUNTER — Encounter (HOSPITAL_BASED_OUTPATIENT_CLINIC_OR_DEPARTMENT_OTHER): Payer: Self-pay

## 2018-03-25 DIAGNOSIS — Z87891 Personal history of nicotine dependence: Secondary | ICD-10-CM | POA: Diagnosis not present

## 2018-03-25 DIAGNOSIS — J449 Chronic obstructive pulmonary disease, unspecified: Secondary | ICD-10-CM | POA: Diagnosis not present

## 2018-03-25 DIAGNOSIS — Z79899 Other long term (current) drug therapy: Secondary | ICD-10-CM | POA: Diagnosis not present

## 2018-03-25 DIAGNOSIS — I10 Essential (primary) hypertension: Secondary | ICD-10-CM | POA: Diagnosis not present

## 2018-03-25 DIAGNOSIS — Z85118 Personal history of other malignant neoplasm of bronchus and lung: Secondary | ICD-10-CM | POA: Insufficient documentation

## 2018-03-25 DIAGNOSIS — C61 Malignant neoplasm of prostate: Secondary | ICD-10-CM | POA: Diagnosis not present

## 2018-03-25 HISTORY — DX: Pneumothorax, unspecified: J93.9

## 2018-03-25 HISTORY — PX: CYSTOSCOPY: SHX5120

## 2018-03-25 HISTORY — PX: SPACE OAR INSTILLATION: SHX6769

## 2018-03-25 HISTORY — PX: RADIOACTIVE SEED IMPLANT: SHX5150

## 2018-03-25 HISTORY — DX: Chronic mastoiditis, bilateral: H70.13

## 2018-03-25 SURGERY — INSERTION, RADIATION SOURCE, PROSTATE
Anesthesia: General | Site: Rectum

## 2018-03-25 MED ORDER — EPHEDRINE SULFATE 50 MG/ML IJ SOLN
INTRAMUSCULAR | Status: AC
  Filled 2018-03-25: qty 1

## 2018-03-25 MED ORDER — EPHEDRINE SULFATE 50 MG/ML IJ SOLN
INTRAMUSCULAR | Status: DC | PRN
  Administered 2018-03-25 (×5): 10 mg via INTRAVENOUS

## 2018-03-25 MED ORDER — CIPROFLOXACIN IN D5W 400 MG/200ML IV SOLN
400.0000 mg | INTRAVENOUS | Status: AC
  Administered 2018-03-25: 400 mg via INTRAVENOUS
  Filled 2018-03-25: qty 200

## 2018-03-25 MED ORDER — PROPOFOL 10 MG/ML IV BOLUS
INTRAVENOUS | Status: DC | PRN
  Administered 2018-03-25: 150 mg via INTRAVENOUS
  Administered 2018-03-25: 50 mg via INTRAVENOUS

## 2018-03-25 MED ORDER — LIDOCAINE 2% (20 MG/ML) 5 ML SYRINGE
INTRAMUSCULAR | Status: DC | PRN
  Administered 2018-03-25: 80 mg via INTRAVENOUS

## 2018-03-25 MED ORDER — SODIUM CHLORIDE 0.9 % IV SOLN
INTRAVENOUS | Status: AC | PRN
  Administered 2018-03-25: 10 mL via INTRAMUSCULAR

## 2018-03-25 MED ORDER — FLEET ENEMA 7-19 GM/118ML RE ENEM
1.0000 | ENEMA | Freq: Once | RECTAL | Status: DC
  Filled 2018-03-25: qty 1

## 2018-03-25 MED ORDER — CIPROFLOXACIN IN D5W 400 MG/200ML IV SOLN
INTRAVENOUS | Status: AC
  Filled 2018-03-25: qty 200

## 2018-03-25 MED ORDER — LIDOCAINE 2% (20 MG/ML) 5 ML SYRINGE
INTRAMUSCULAR | Status: AC
  Filled 2018-03-25: qty 5

## 2018-03-25 MED ORDER — ONDANSETRON HCL 4 MG/2ML IJ SOLN
INTRAMUSCULAR | Status: AC
Start: 2018-03-25 — End: ?
  Filled 2018-03-25: qty 2

## 2018-03-25 MED ORDER — HYDROCODONE-ACETAMINOPHEN 10-325 MG PO TABS: 1.0000 | ORAL_TABLET | ORAL | 0 refills | Status: DC | PRN

## 2018-03-25 MED ORDER — MIDAZOLAM HCL 5 MG/5ML IJ SOLN
INTRAMUSCULAR | Status: DC | PRN
  Administered 2018-03-25: 2 mg via INTRAVENOUS

## 2018-03-25 MED ORDER — LACTATED RINGERS IV SOLN
INTRAVENOUS | Status: DC
  Administered 2018-03-25: 12:00:00 via INTRAVENOUS
  Filled 2018-03-25: qty 1000

## 2018-03-25 MED ORDER — FENTANYL CITRATE (PF) 100 MCG/2ML IJ SOLN
25.0000 ug | INTRAMUSCULAR | Status: DC | PRN
  Filled 2018-03-25: qty 1

## 2018-03-25 MED ORDER — CIPROFLOXACIN HCL 500 MG PO TABS: 500.0000 mg | ORAL_TABLET | Freq: Two times a day (BID) | ORAL | 0 refills | Status: DC

## 2018-03-25 MED ORDER — MEPERIDINE HCL 25 MG/ML IJ SOLN
6.2500 mg | INTRAMUSCULAR | Status: DC | PRN
  Filled 2018-03-25: qty 1

## 2018-03-25 MED ORDER — GLYCOPYRROLATE 0.2 MG/ML IV SOSY
PREFILLED_SYRINGE | INTRAVENOUS | Status: DC | PRN
  Administered 2018-03-25: .1 mg via INTRAVENOUS

## 2018-03-25 MED ORDER — GLYCOPYRROLATE 0.2 MG/ML IV SOSY
PREFILLED_SYRINGE | INTRAVENOUS | Status: AC
  Filled 2018-03-25: qty 5

## 2018-03-25 MED ORDER — SODIUM CHLORIDE 0.9 % IV SOLN
INTRAVENOUS | Status: AC | PRN
  Administered 2018-03-25: 1000 mL

## 2018-03-25 MED ORDER — LACTATED RINGERS IV SOLN
INTRAVENOUS | Status: DC
  Administered 2018-03-25: 09:00:00 via INTRAVENOUS
  Administered 2018-03-25: 1000 mL via INTRAVENOUS
  Filled 2018-03-25: qty 1000

## 2018-03-25 MED ORDER — FENTANYL CITRATE (PF) 100 MCG/2ML IJ SOLN
INTRAMUSCULAR | Status: AC
  Filled 2018-03-25: qty 2

## 2018-03-25 MED ORDER — DEXAMETHASONE SODIUM PHOSPHATE 10 MG/ML IJ SOLN
INTRAMUSCULAR | Status: AC
  Filled 2018-03-25: qty 1

## 2018-03-25 MED ORDER — METOCLOPRAMIDE HCL 5 MG/ML IJ SOLN
10.0000 mg | Freq: Once | INTRAMUSCULAR | Status: DC | PRN
  Filled 2018-03-25: qty 2

## 2018-03-25 MED ORDER — PHENYLEPHRINE 40 MCG/ML (10ML) SYRINGE FOR IV PUSH (FOR BLOOD PRESSURE SUPPORT)
PREFILLED_SYRINGE | INTRAVENOUS | Status: AC
  Filled 2018-03-25: qty 10

## 2018-03-25 MED ORDER — ONDANSETRON HCL 4 MG/2ML IJ SOLN
INTRAMUSCULAR | Status: DC | PRN
  Administered 2018-03-25: 4 mg via INTRAVENOUS

## 2018-03-25 MED ORDER — DEXAMETHASONE SODIUM PHOSPHATE 4 MG/ML IJ SOLN
INTRAMUSCULAR | Status: DC | PRN
  Administered 2018-03-25: 10 mg via INTRAVENOUS

## 2018-03-25 MED ORDER — PROPOFOL 500 MG/50ML IV EMUL
INTRAVENOUS | Status: AC
Start: 2018-03-25 — End: ?
  Filled 2018-03-25: qty 50

## 2018-03-25 MED ORDER — MIDAZOLAM HCL 2 MG/2ML IJ SOLN
INTRAMUSCULAR | Status: AC
  Filled 2018-03-25: qty 2

## 2018-03-25 MED ORDER — PHENYLEPHRINE 40 MCG/ML (10ML) SYRINGE FOR IV PUSH (FOR BLOOD PRESSURE SUPPORT)
PREFILLED_SYRINGE | INTRAVENOUS | Status: DC | PRN
  Administered 2018-03-25: 80 ug via INTRAVENOUS
  Administered 2018-03-25: 40 ug via INTRAVENOUS

## 2018-03-25 MED ORDER — IOHEXOL 300 MG/ML  SOLN
INTRAMUSCULAR | Status: DC | PRN
  Administered 2018-03-25: 7 mL

## 2018-03-25 MED ORDER — FENTANYL CITRATE (PF) 100 MCG/2ML IJ SOLN
INTRAMUSCULAR | Status: DC | PRN
  Administered 2018-03-25: 100 ug via INTRAVENOUS

## 2018-03-25 SURGICAL SUPPLY — 30 items
BAG URINE DRAINAGE (UROLOGICAL SUPPLIES) ×4 IMPLANT
BLADE CLIPPER SURG (BLADE) ×4 IMPLANT
CATH FOLEY 2WAY SLVR  5CC 16FR (CATHETERS) ×1
CATH FOLEY 2WAY SLVR 5CC 16FR (CATHETERS) ×3 IMPLANT
CATH ROBINSON RED A/P 16FR (CATHETERS) IMPLANT
CATH ROBINSON RED A/P 20FR (CATHETERS) ×4 IMPLANT
CLOTH BEACON ORANGE TIMEOUT ST (SAFETY) ×4 IMPLANT
CONT SPEC 4OZ CLIKSEAL STRL BL (MISCELLANEOUS) ×1 IMPLANT
COVER BACK TABLE 60X90IN (DRAPES) ×4 IMPLANT
COVER MAYO STAND STRL (DRAPES) ×4 IMPLANT
DRSG TEGADERM 4X4.75 (GAUZE/BANDAGES/DRESSINGS) ×7 IMPLANT
DRSG TEGADERM 8X12 (GAUZE/BANDAGES/DRESSINGS) ×7 IMPLANT
GAUZE SPONGE 4X4 12PLY STRL (GAUZE/BANDAGES/DRESSINGS) ×1 IMPLANT
GLOVE BIO SURGEON STRL SZ8 (GLOVE) ×4 IMPLANT
GLOVE ECLIPSE 8.0 STRL XLNG CF (GLOVE) ×6 IMPLANT
GOWN STRL REUS W/TWL XL LVL3 (GOWN DISPOSABLE) ×4 IMPLANT
HOLDER FOLEY CATH W/STRAP (MISCELLANEOUS) IMPLANT
IMPL SPACEOAR SYSTEM 10ML (MISCELLANEOUS) ×3 IMPLANT
IMPLANT SPACEOAR SYSTEM 10ML (MISCELLANEOUS) ×4
IV NS 1000ML (IV SOLUTION) ×4
IV NS 1000ML BAXH (IV SOLUTION) ×3 IMPLANT
KIT TURNOVER CYSTO (KITS) ×4 IMPLANT
PACK CYSTO (CUSTOM PROCEDURE TRAY) ×4 IMPLANT
SURGILUBE 2OZ TUBE FLIPTOP (MISCELLANEOUS) ×4 IMPLANT
SUT BONE WAX W31G (SUTURE) ×3 IMPLANT
SYRINGE 10CC LL (SYRINGE) ×2 IMPLANT
TOWEL OR 17X24 6PK STRL BLUE (TOWEL DISPOSABLE) ×8 IMPLANT
UNDERPAD 30X30 (UNDERPADS AND DIAPERS) ×8 IMPLANT
WATER STERILE IRR 500ML POUR (IV SOLUTION) ×4 IMPLANT
i-seed AGX 100 ×71 IMPLANT

## 2018-03-25 NOTE — Discharge Instructions (Signed)
DISCHARGE INSTRUCTIONS FOR PROSTATE SEED IMPLANTATION ° °Antibiotics °You may be given a prescription for an antibiotic to take when you arrive home. If so, be sure to take every tablet in the bottle, even if you are feeling better before the prescription is finished. If you begin itching, notice a rash or start to swell on your trunk, arms, legs and/or throat, immediately stop taking the antibiotic and call your Urologist. °Diet °Resume your usual diet when you return home. To keep your bowels moving easily and softly, drink prune, apple and cranberry juice at room temperature. You may also take a stool softener, such as Colace, which is available without prescription at local pharmacies. °Daily activities °? No driving or heavy lifting for at least two days after the implant. °? No bike riding, horseback riding or riding lawn mowers for the first month after the implant. °? Any strenuous physical activity should be approved by your doctor before you resume it. °Sexual relations °You may resume sexual relations two weeks after the procedure. A condom should be used for the first two weeks. Your semen may be dark brown or black; this is normal and is related bleeding that may have occurred during the implant. °Postoperative swelling °Expect swelling and bruising of the scrotum and perineum (the area between the scrotum and anus). Both the swelling and the bruising should resolve in l or 2 weeks. Ice packs and over- the-counter medications such as Tylenol, Advil or Aleve may lessen your discomfort. °Postoperative urination °Most men experience burning on urination and/or urinary frequency. If this becomes bothersome, contact your Urologist.  Medication can be prescribed to relieve these problems.  It is normal to have some blood in your urine for a few days after the implant. °Special instructions related to the seeds °It is unlikely that you will pass an Iodine-125 seed in your urine. The seeds are silver in color  and are about as large as a grain of rice. If you pass a seed, do not handle it with your fingers. Use a spoon to place it in an envelope or jar in place this in base occluded area such as the garage or basement for return to the radiation clinic at your convenience. ° °Contact your doctor for °? Temperature greater than 101 F °? Increasing pain °? Inability to urinate °Follow-up ° You should have follow up with your urologist and radiation oncologist about 3 weeks after the procedure. °General information regarding Iodine seeds °? Iodine-125 is a low energy radioactive material. It is not deeply penetrating and loses energy at short distances. Your prostate will absorb the radiation. Objects that are touched or used by the patient do not become radioactive. °? Body wastes (urine and stool) or body fluids (saliva, tears, semen or blood) are not radioactive. °? The Nuclear Regulatory Commission (NRC) has determined that no radiation precautions are needed for patients undergoing Iodine-125 seed implantation. The NRC states that such patients do not present a risk to the people around them, including young children and pregnant women. However, in keeping with the general principle that radiation exposure should be kept as low reasonably possible, we suggest the following: °? Children and pets should not sit on the patient's lap for the first two (2) weeks after the implant. °? Pregnant (or possibly pregnant) women should avoid prolonged, close contact with the patient for the first two (2) weeks after the implant. °? A distance of three (3) feet is acceptable. °? At a distance of three (3)   feet, there is no limit to the length of time anyone can be with the patient. ?    Post Anesthesia Home Care Instructions  Activity: Get plenty of rest for the remainder of the day. A responsible individual must stay with you for 24 hours following the procedure.  For the next 24 hours, DO NOT: -Drive a car -Conservation officer, nature -Drink alcoholic beverages -Take any medication unless instructed by your physician -Make any legal decisions or sign important papers.  Meals: Start with liquid foods such as gelatin or soup. Progress to regular foods as tolerated. Avoid greasy, spicy, heavy foods. If nausea and/or vomiting occur, drink only clear liquids until the nausea and/or vomiting subsides. Call your physician if vomiting continues.  Special Instructions/Symptoms: Your throat may feel dry or sore from the anesthesia or the breathing tube placed in your throat during surgery. If this causes discomfort, gargle with warm salt water. The discomfort should disappear within 24 hours.  If you had a scopolamine patch placed behind your ear for the management of post- operative nausea and/or vomiting:  1. The medication in the patch is effective for 72 hours, after which it should be removed.  Wrap patch in a tissue and discard in the trash. Wash hands thoroughly with soap and water. 2. You may remove the patch earlier than 72 hours if you experience unpleasant side effects which may include dry mouth, dizziness or visual disturbances. 3. Avoid touching the patch. Wash your hands with soap and water after contact with the patch.

## 2018-03-25 NOTE — Anesthesia Postprocedure Evaluation (Signed)
Anesthesia Post Note  Patient: Marinus Eicher  Procedure(s) Performed: RADIOACTIVE SEED IMPLANT/BRACHYTHERAPY IMPLANT (N/A Prostate) SPACE OAR INSTILLATION (N/A Rectum) CYSTOSCOPY (Bladder)     Patient location during evaluation: PACU Anesthesia Type: General Level of consciousness: awake and alert Pain management: pain level controlled Vital Signs Assessment: post-procedure vital signs reviewed and stable Respiratory status: spontaneous breathing, nonlabored ventilation, respiratory function stable and patient connected to nasal cannula oxygen Cardiovascular status: blood pressure returned to baseline and stable Postop Assessment: no apparent nausea or vomiting Anesthetic complications: no    Last Vitals:  Vitals:   03/25/18 1030 03/25/18 1045  BP: 135/87   Pulse: 94   Resp: 12   Temp:    SpO2: 91% 91%    Last Pain:  Vitals:   03/25/18 1145  TempSrc:   PainSc: 0-No pain                 Montez Hageman

## 2018-03-25 NOTE — Anesthesia Preprocedure Evaluation (Signed)
Anesthesia Evaluation  Patient identified by MRN, date of birth, ID band Patient awake    Reviewed: Allergy & Precautions, NPO status , Patient's Chart, lab work & pertinent test results  Airway Mallampati: II  TM Distance: >3 FB Neck ROM: Full    Dental no notable dental hx. (+) Edentulous Upper, Edentulous Lower   Pulmonary COPD,  COPD inhaler, former smoker,    Pulmonary exam normal breath sounds clear to auscultation       Cardiovascular hypertension, Pt. on medications Normal cardiovascular exam Rhythm:Regular Rate:Normal     Neuro/Psych negative neurological ROS  negative psych ROS   GI/Hepatic negative GI ROS, Neg liver ROS,   Endo/Other  negative endocrine ROS  Renal/GU negative Renal ROS  negative genitourinary   Musculoskeletal negative musculoskeletal ROS (+)   Abdominal   Peds negative pediatric ROS (+)  Hematology negative hematology ROS (+)   Anesthesia Other Findings   Reproductive/Obstetrics negative OB ROS                             Anesthesia Physical Anesthesia Plan  ASA: III  Anesthesia Plan: General   Post-op Pain Management:    Induction: Intravenous  PONV Risk Score and Plan: 2 and Ondansetron and Treatment may vary due to age or medical condition  Airway Management Planned: Oral ETT  Additional Equipment:   Intra-op Plan:   Post-operative Plan: Extubation in OR  Informed Consent: I have reviewed the patients History and Physical, chart, labs and discussed the procedure including the risks, benefits and alternatives for the proposed anesthesia with the patient or authorized representative who has indicated his/her understanding and acceptance.   Dental advisory given  Plan Discussed with: CRNA  Anesthesia Plan Comments:         Anesthesia Quick Evaluation

## 2018-03-25 NOTE — Anesthesia Procedure Notes (Signed)
Procedure Name: LMA Insertion Date/Time: 03/25/2018 7:39 AM Performed by: Lieutenant Diego, CRNA Pre-anesthesia Checklist: Patient identified, Emergency Drugs available, Suction available and Patient being monitored Patient Re-evaluated:Patient Re-evaluated prior to induction Oxygen Delivery Method: Circle system utilized Preoxygenation: Pre-oxygenation with 100% oxygen Induction Type: IV induction Ventilation: Mask ventilation without difficulty LMA: LMA inserted LMA Size: 4.0 Number of attempts: 1 Placement Confirmation: positive ETCO2 and breath sounds checked- equal and bilateral Tube secured with: Tape Dental Injury: Teeth and Oropharynx as per pre-operative assessment

## 2018-03-25 NOTE — Op Note (Signed)
PATIENT:  Kyle Hamilton  PRE-OPERATIVE DIAGNOSIS:  Adenocarcinoma of the prostate  POST-OPERATIVE DIAGNOSIS:  Same  PROCEDURE:  1. I-125 radioactive seed implantation 2. Cystoscopy  3. Placement of SpaceOAR  SURGEON:  Surgeon(s): Claybon Jabs  Radiation oncologist: Dr. [name]  ANESTHESIA:  General  EBL:  Minimal  DRAINS: None  INDICATION: Kyle Hamilton is a 62 year old male with newly diagnosed adenocarcinoma the prostate.  He is elected to proceed with radioactive seed implant with curative intent.  Description of procedure: After informed consent the patient was brought to the major OR, placed on the table and administered general anesthesia. He was then moved to the modified lithotomy position with his perineum perpendicular to the floor. His perineum and genitalia were then sterilely prepped. An official timeout was then performed. A 16 French Foley catheter was then placed in the bladder and filled with dilute contrast, a rectal tube was placed in the rectum and the transrectal ultrasound probe was placed in the rectum and affixed to the stand. He was then sterilely draped.  Real time ultrasonography was used along with the seed planning software [employer] Prostate vs. 4.2.2.4. This was used to develop the seed plan including the number of needles as well as number of seeds required for complete and adequate coverage.  The needles were then preloaded with seeds and spacers according to the previously developed plan.  Real-time ultrasonography was then used along with the previously developed plan to implant a total of 71 seeds using 22 needles. This proceeded without difficulty or complication.   I then proceeded with placement of SpaceOAR by introducing a needle with the bevel angled inferiorly approximately 2 cm superior to the anus. This was angled downward and under direct ultrasound was placed within the space between the prostatic capsule and rectum. This was confirmed  with a small amount of sterile saline injected and this was performed under direct ultrasound. I then attached the SpaceOAR to the needle and injected this in the space between the prostate and rectum with good placement noted.  A Foley catheter was then removed as well as the transrectal ultrasound probe and rectal probe. Flexible cystoscopy was then performed using the 17 French flexible scope which revealed a normal urethra throughout its length down to the sphincter which appeared intact. The prostatic urethra revealed bilobar hypertrophy but no evidence of obstruction, seeds, spacers or lesions. The bladder was then entered and fully and systematically inspected. The ureteral orifices were noted to be of normal configuration and position. The mucosa revealed no evidence of tumors. There were also no stones identified within the bladder. I noted no seeds or spacers on the floor of the bladder and retroflexion of the scope revealed no seeds protruding from the base of the prostate.  The cystoscope was then removed and the patient was awakened and taken to recovery room in stable and satisfactory condition. He tolerated procedure well and there were no intraoperative complications.

## 2018-03-25 NOTE — Transfer of Care (Signed)
Immediate Anesthesia Transfer of Care Note  Patient: Kevon Tench  Procedure(s) Performed: RADIOACTIVE SEED IMPLANT/BRACHYTHERAPY IMPLANT (N/A Prostate) SPACE OAR INSTILLATION (N/A Rectum) CYSTOSCOPY  Patient Location: PACU  Anesthesia Type:General  Level of Consciousness: awake  Airway & Oxygen Therapy: Patient Spontanous Breathing and Patient connected to face mask oxygen  Post-op Assessment: Report given to RN and Post -op Vital signs reviewed and stable  Post vital signs: Reviewed and stable  Last Vitals:  Vitals Value Taken Time  BP 121/95 03/25/2018  9:50 AM  Temp    Pulse 109 03/25/2018  9:52 AM  Resp 15 03/25/2018  9:52 AM  SpO2 100 % 03/25/2018  9:52 AM  Vitals shown include unvalidated device data.  Last Pain:  Vitals:   03/25/18 0618  TempSrc:   PainSc: 0-No pain      Patients Stated Pain Goal: 4 ([id number])  Complications: No apparent anesthesia complications

## 2018-03-27 ENCOUNTER — Emergency Department (HOSPITAL_COMMUNITY)
Admission: EM | Admit: 2018-03-27 | Discharge: 2018-03-27 | Disposition: A | Discharge status: Home / Self Care | Payer: Medicare Other | Attending: Emergency Medicine | Admitting: Emergency Medicine

## 2018-03-27 ENCOUNTER — Encounter (HOSPITAL_COMMUNITY): Payer: Self-pay | Admitting: *Deleted

## 2018-03-27 ENCOUNTER — Other Ambulatory Visit: Payer: Self-pay

## 2018-03-27 DIAGNOSIS — Z85118 Personal history of other malignant neoplasm of bronchus and lung: Secondary | ICD-10-CM | POA: Diagnosis not present

## 2018-03-27 DIAGNOSIS — R1030 Lower abdominal pain, unspecified: Secondary | ICD-10-CM | POA: Diagnosis present

## 2018-03-27 DIAGNOSIS — Z87891 Personal history of nicotine dependence: Secondary | ICD-10-CM | POA: Diagnosis not present

## 2018-03-27 DIAGNOSIS — R531 Weakness: Secondary | ICD-10-CM | POA: Diagnosis not present

## 2018-03-27 DIAGNOSIS — J449 Chronic obstructive pulmonary disease, unspecified: Secondary | ICD-10-CM | POA: Diagnosis not present

## 2018-03-27 DIAGNOSIS — R338 Other retention of urine: Secondary | ICD-10-CM | POA: Diagnosis not present

## 2018-03-27 DIAGNOSIS — I10 Essential (primary) hypertension: Secondary | ICD-10-CM | POA: Diagnosis not present

## 2018-03-27 DIAGNOSIS — R39198 Other difficulties with micturition: Secondary | ICD-10-CM | POA: Insufficient documentation

## 2018-03-27 DIAGNOSIS — Z8546 Personal history of malignant neoplasm of prostate: Secondary | ICD-10-CM | POA: Diagnosis not present

## 2018-03-27 DIAGNOSIS — R339 Retention of urine, unspecified: Secondary | ICD-10-CM | POA: Diagnosis not present

## 2018-03-27 LAB — URINALYSIS, ROUTINE W REFLEX MICROSCOPIC
Bilirubin Urine: NEGATIVE
Glucose, UA: NEGATIVE mg/dL
Hgb urine dipstick: NEGATIVE
Ketones, ur: NEGATIVE mg/dL
Leukocytes, UA: NEGATIVE
Nitrite: NEGATIVE
Protein, ur: NEGATIVE mg/dL
Specific Gravity, Urine: 1.014 (ref 1.005–1.030)
pH: 9 — ABNORMAL HIGH (ref 5.0–8.0)

## 2018-03-27 MED ORDER — PHENAZOPYRIDINE HCL 200 MG PO TABS: 200.0000 mg | ORAL_TABLET | Freq: Three times a day (TID) | ORAL | 0 refills | Status: DC

## 2018-03-27 NOTE — ED Provider Notes (Signed)
[facility] DEPT Provider Note   CSN: [id number] Arrival date & time: 03/27/18  1005     History   Chief Complaint Chief Complaint  Patient presents with  . Prostate pain    HPI Kyle Hamilton is a 62 y.o. male.  HPI  Patient with multiple medical problems presents with his wife who assists with HPI. Patient notes that he was generally well following insertion of brachii therapy seeds on Friday for prostate cancer. However,, over the past 18 hours or so the patient has had increased pain in his suprapubic region, with diminished urine production. No fever, no vomiting, though there is nausea. No relief with anything until the patient had placement of a Foley catheter, after the initial evaluation. Now he states that his pain has diminished substantially, and he feels much better.  Past Medical History:  Diagnosis Date  . Chronic mastoiditis, bilateral   . COPD (chronic obstructive pulmonary disease) ([place])   . Hearing loss   . Hypertension   . Lung cancer ([place])   . Pneumothorax, left    History of post op  . Prostate cancer ([place])   . Shortness of breath dyspnea    on exertion  . Spinal stenosis     Patient Active Problem List   Diagnosis Date Noted  . Malignant neoplasm of prostate ([place]) 12/11/2017  . Lung mass 10/09/2014  . Nodule of left lung 09/19/2014    Past Surgical History:  Procedure Laterality Date  . BAHA REVISION Right 08/30/2014   Procedure: BONE ANCHORED HEARING AID (BAHA) REVISION RIGHT SIDE;  Surgeon: Izora Gala, MD;  Location: [facility];  Service: ENT;  Laterality: Right;  . BONE ANCHORED HEARING AID IMPLANT  08/03/2012   Procedure: BONE ANCHORED HEARING AID (BAHA) IMPLANT;  Surgeon: Izora Gala, MD;  Location: [facility];  Service: ENT;  Laterality: Right;  . COLONOSCOPY  2007  . LEG SURGERY     right leg deep cut  . MOUTH SURGERY    . PROSTATE BIOPSY    . STAPEDES SURGERY     bilateral  . VIDEO ASSISTED THORACOSCOPY  (VATS)/ LOBECTOMY Left 10/09/2014   Procedure: VIDEO ASSISTED THORACOSCOPY (VATS)/ LOBECTOMY;  Surgeon: Ivin Poot, MD;  Location: [facility];  Service: Thoracic;  Laterality: Left;        Home Medications    Prior to Admission medications   Medication Sig Start Date End Date Taking? Authorizing Provider  budesonide-formoterol (SYMBICORT) 160-4.5 MCG/ACT inhaler Inhale 2 puffs into the lungs 2 (two) times daily. Patient taking differently: Inhale 2 puffs into the lungs 2 (two) times daily as needed (wheezing).  06/02/17  Yes Ivin Poot, MD  ciprofloxacin (CIPRO) 500 MG tablet Take 1 tablet (500 mg total) by mouth 2 (two) times daily. 03/25/18  Yes Kathie Rhodes, MD  hydrochlorothiazide (MICROZIDE) 12.5 MG capsule TK 1 C PO QD IN THE MORNING 11/19/17  Yes [provider]  HYDROcodone-acetaminophen (NORCO) 10-325 MG tablet Take 1-2 tablets by mouth every 4 (four) hours as needed for moderate pain. Maximum dose per 24 hours - 8 pills 03/25/18   Kathie Rhodes, MD  sulfamethoxazole-trimethoprim (BACTRIM DS,SEPTRA DS) 800-160 MG tablet Take 1 tablet by mouth 2 (two) times daily. Patient not taking: Reported on 03/27/2018 12/09/17   Freeman Caldron, PA-C    Family History Family History  Problem Relation Age of Onset  . Cancer Neg Hx     Social History Social History   Tobacco Use  . Smoking status:  Former Smoker    Packs/day: 1.00    Years: 35.00    Pack years: 35.00    Types: Cigarettes    Last attempt to quit: 05/11/2014    Years since quitting: 3.8  . Smokeless tobacco: Never Used  Substance Use Topics  . Alcohol use: No  . Drug use: No     Allergies   Patient has no known allergies.   Review of Systems Review of Systems  Constitutional:       Per HPI, otherwise negative  HENT:       Per HPI, otherwise negative  Respiratory:       Per HPI, otherwise negative  Cardiovascular:       Per HPI, otherwise negative  Gastrointestinal: Positive for abdominal pain  and nausea. Negative for vomiting.  Endocrine:       Negative aside from HPI  Genitourinary:       Neg aside from HPI   Musculoskeletal:       Per HPI, otherwise negative  Skin: Negative.   Allergic/Immunologic: Positive for immunocompromised state.  Neurological: Positive for weakness. Negative for syncope.     Physical Exam Updated Vital Signs BP 129/81 (BP Location: Right Arm)   Pulse 68   Resp 15   Ht 5\' 5"  (1.651 m)   Wt 55.3 kg (122 lb)   SpO2 99%   BMI 20.30 kg/m   Physical Exam  Constitutional: He is oriented to person, place, and time. He has a sickly appearance. No distress.  HENT:  Head: Normocephalic and atraumatic.  Eyes: Conjunctivae and EOM are normal.  Cardiovascular: Normal rate and regular rhythm.  Pulmonary/Chest: Effort normal. No stridor. No respiratory distress.  Abdominal: He exhibits no distension.    Genitourinary:  Genitourinary Comments: Foley catheter bag with substantial urine  Musculoskeletal: He exhibits no edema.  Neurological: He is alert and oriented to person, place, and time.  Skin: Skin is warm and dry.  Psychiatric: He has a normal mood and affect.  Nursing note and vitals reviewed.    ED Treatments / Results  Labs (all labs ordered are listed, but only abnormal results are displayed) Labs Reviewed  URINALYSIS, ROUTINE W REFLEX MICROSCOPIC - Abnormal; Notable for the following components:      Result Value   APPearance CLOUDY (*)    pH 9.0 (*)    All other components within normal limits   Radiology  Nursing staff bedside bladder scan notable for greater than 700 mL urine  Procedures Procedures (including critical care time)  Medications Ordered in ED Medications - No data to display   Initial Impression / Assessment and Plan / ED Course  I have reviewed the triage vital signs and the nursing notes.  Pertinent labs & imaging results that were available during my care of the patient were reviewed by me and  considered in my medical decision making (see chart for details).  Repeat exam patient is in similar condition, pain has resolved. Urinalysis does not demonstrate infection. Patient remains hemodynamically unremarkable, and without ongoing complaints, there is low suspicion for occult infection and other areas. Some suspicion for acute urinary retention causing his pain, given his substantial reduction after placement of Foley catheter. Given this improvement, absence of other complaints, absence of evidence for bacteremia or sepsis, the patient was discharged with close outpatient follow-up with urology.  Final Clinical Impressions(s) / ED Diagnoses  Acute urinary retention   Carmin Muskrat, MD 03/27/18 1130

## 2018-03-27 NOTE — Progress Notes (Signed)
  Radiation Oncology         [phone] ________________________________  Name: Kyle Hamilton MRN: [id number]  Date: 03/27/2018  DOB: 01-26-1956       Prostate Seed Implant  UT:[id number], Abigail Butts, MD  No ref. provider found  DIAGNOSIS:  62 yo gentleman with stage T2c adenocarcinoma of the prostate with a Gleason's score of 3+4=7 and a PSA of 7.7     ICD-10-CM   1. Prostate cancer [facility]) [place] Discharge patient    CANCELED: DG Chest 2 View    CANCELED: DG Chest 2 View    PROCEDURE: Insertion of radioactive I-125 seeds into the prostate gland.  RADIATION DOSE: 145 Gy, definitive therapy.  TECHNIQUE: Kyle Hamilton was brought to the operating room with the urologist. He was placed in the dorsolithotomy position. He was catheterized and a rectal tube was inserted. The perineum was shaved, prepped and draped. The ultrasound probe was then introduced into the rectum to see the prostate gland.  TREATMENT DEVICE: A needle grid was attached to the ultrasound probe stand and anchor needles were placed.  3D PLANNING: The prostate was imaged in 3D using a sagittal sweep of the prostate probe. These images were transferred to the planning computer. There, the prostate, urethra and rectum were defined on each axial reconstructed image. Then, the software created an optimized 3D plan and a few seed positions were adjusted. The quality of the plan was reviewed using [facility] information for the target and the following two organs at risk:  Urethra and Rectum.  Then the accepted plan was printed and handed off to the radiation therapist.  Under my supervision, the custom loading of the seeds and spacers was carried out and loaded into sealed vicryl sleeves.  These pre-loaded needles were then placed into the needle holder.Marland Kitchen  PROSTATE VOLUME STUDY:  Using transrectal ultrasound the volume of the prostate was verified to be 40.6 cc.  SPECIAL TREATMENT PROCEDURE/SUPERVISION AND HANDLING: The pre-loaded needles were then  delivered under sagittal guidance. A total of 22 needles were used to deposit 71 seeds in the prostate gland. The individual seed activity was 0.446 mCi.  SpaceOAR:  Yes  COMPLEX SIMULATION: At the end of the procedure, an anterior radiograph of the pelvis was obtained to document seed positioning and count. Cystoscopy was performed to check the urethra and bladder.  MICRODOSIMETRY: At the end of the procedure, the patient was emitting 0.17 mR/hr at 1 meter. Accordingly, he was considered safe for hospital discharge.  PLAN: The patient will return to the radiation oncology clinic for post implant CT dosimetry in three weeks.   ________________________________  Sheral Apley Tammi Klippel, M.D.

## 2018-03-27 NOTE — Discharge Instructions (Signed)
As discussed, your evaluation today has been largely reassuring.  But, it is important that you monitor your condition carefully, and do not hesitate to return to the ED if you develop new, or concerning changes in your condition. ? ?Otherwise, please follow-up with your physician for appropriate ongoing care. ? ?

## 2018-03-27 NOTE — ED Triage Notes (Signed)
Pt had radiation seeds placed on Friday, has been in pain since, unable to void last night. Pt in severe pain

## 2018-03-28 ENCOUNTER — Encounter (HOSPITAL_BASED_OUTPATIENT_CLINIC_OR_DEPARTMENT_OTHER): Payer: Self-pay | Admitting: Urology

## 2018-04-06 NOTE — Progress Notes (Signed)
Radiation Oncology         [phone]) [phone] ________________________________  Name: Kyle Hamilton MRN: [id number]  Date: 04/08/2018  DOB: 1956/05/23  Post-Seed Follow-Up Visit Note  CC: Cari Caraway, MD  Kathie Rhodes, MD  Diagnosis:   62 y.o. male with stage T2c adenocarcinoma of the prostate with a Gleason's score of 3+4=7 and a PSA of 7.7     ICD-10-CM   1. Malignant neoplasm of prostate ([place]) C61     Interval Since Last Radiation:  2 weeks -  03/25/18:  Insertion of radioactive I-125 seeds into the prostate gland, 145 Gy definitive therapy, with SpaceOAR  Narrative:  The patient returns today for routine follow-up.  He is complaining of increased urinary frequency and urinary hesitation symptoms. He filled out a questionnaire regarding urinary function today providing an overall IPSS score of 3 characterizing his symptoms as mild.  He did struggle with postop acute urinary retention which resolved over the course of approximately 72 hours.  His catheter was removed in the office at [facility] urology the Monday following his procedure and he was started on Flomax at that time.  Since that time, he reports being able to empty his bladder well on voiding with mild hesitancy, intermittency and occasional weak stream but overall he is quite pleased with his improvement.  His pre-implant score was 10. He denies any bowel symptoms, dysuria, gross hematuria, fever, chills or night sweats.  ALLERGIES:  has No Known Allergies.  Meds: Current Outpatient Medications  Medication Sig Dispense Refill  . tamsulosin (FLOMAX) 0.4 MG CAPS capsule Take 0.4 mg by mouth.    . budesonide-formoterol (SYMBICORT) 160-4.5 MCG/ACT inhaler Inhale 2 puffs into the lungs 2 (two) times daily. (Patient taking differently: Inhale 2 puffs into the lungs 2 (two) times daily as needed (wheezing). ) 1 Inhaler 1  . ciprofloxacin (CIPRO) 500 MG tablet Take 1 tablet (500 mg total) by mouth 2 (two) times daily. 6 tablet 0  .  hydrochlorothiazide (MICROZIDE) 12.5 MG capsule TK 1 C PO QD IN THE MORNING  1  . HYDROcodone-acetaminophen (NORCO) 10-325 MG tablet Take 1-2 tablets by mouth every 4 (four) hours as needed for moderate pain. Maximum dose per 24 hours - 8 pills 10 tablet 0  . phenazopyridine (PYRIDIUM) 200 MG tablet Take 1 tablet (200 mg total) by mouth 3 (three) times daily. 6 tablet 0  . sulfamethoxazole-trimethoprim (BACTRIM DS,SEPTRA DS) 800-160 MG tablet Take 1 tablet by mouth 2 (two) times daily. (Patient not taking: Reported on 03/27/2018) 20 tablet 0   No current facility-administered medications for this encounter.     Physical Findings: In general this is a well appearing Caucasian male in no acute distress. He's alert and oriented x4 and appropriate throughout the examination. Cardiopulmonary assessment is negative for acute distress and he exhibits normal effort.   Lab Findings: Lab Results  Component Value Date   WBC 6.2 03/18/2018   HGB 14.8 03/18/2018   HCT 44.4 03/18/2018   MCV 92.7 03/18/2018   PLT 284 03/18/2018    Radiographic Findings:  Patient underwent CT imaging in our clinic for post implant dosimetry. The CT was reviewed by Dr. Tammi Klippel and appears to demonstrate an adequate distribution of radioactive seeds throughout the prostate gland. There are no seeds in or near the rectum.  He is scheduled for a prostate MRI at 2:45 PM today and these images will be fused with his CT images for further evaluation.  We suspect the final radiation plan and  dosimetry will show appropriate coverage of the prostate gland.   Impression/Plan: The patient is recovering from the effects of radiation. His urinary symptoms should gradually improve over the next 4-6 months. We talked about this today. He is encouraged by his improvement already and is otherwise pleased with his outcome. We also talked about long-term follow-up for prostate cancer following seed implant. He understands that ongoing PSA  determinations and digital rectal exams will help perform surveillance to rule out disease recurrence. He has a follow up appointment scheduled with Jimmey Ralph, NP on 03/15/18. He understands what to expect with his PSA measures. Patient was also educated today about some of the long-term effects from radiation including a small risk for rectal bleeding and possibly erectile dysfunction. We talked about some of the general management approaches to these potential complications. However, I did encourage the patient to contact our office or return at any point if he has questions or concerns related to his previous radiation and prostate cancer.    Nicholos Johns, PA-C  This document serves as a record of services personally performed by [employer], PA-C. It was created on her behalf by Rae Lips, a trained medical scribe. The creation of this record is based on the scribe's personal observations and the provider's statements to them. This document has been checked and approved by the attending provider.

## 2018-04-06 NOTE — Progress Notes (Signed)
  Radiation Oncology         [phone]) [phone] ________________________________  Name: Kyle Hamilton MRN: [id number]  Date: 04/08/2018  DOB: Nov 06, 1956  COMPLEX SIMULATION NOTE  NARRATIVE:  The patient was brought to the [facility] today following prostate seed implantation approximately one month ago.  Identity was confirmed.  All relevant records and images related to the planned course of therapy were reviewed.  Then, the patient was set-up supine.  CT images were obtained.  The CT images were loaded into the planning software.  Then the prostate and rectum were contoured.  Treatment planning then occurred.  The implanted iodine 125 seeds were identified by the physics staff for projection of radiation distribution  I have requested : 3D Simulation  I have requested a DVH of the following structures: Prostate and rectum.    ________________________________  Sheral Apley Tammi Klippel, M.D.  This document serves as a record of services personally performed by [name], MD. It was created on his behalf by Rae Lips, a trained medical scribe. The creation of this record is based on the scribe's personal observations and the provider's statements to them. This document has been checked and approved by the attending provider.

## 2018-04-07 ENCOUNTER — Telehealth: Payer: Self-pay | Admitting: *Deleted

## 2018-04-07 NOTE — Telephone Encounter (Signed)
CALLED PATIENT TO REMIND OF POST SEED APPTS. AND MRI FOR 04-08-18, SPOKE WITH PATIENT AND HE IS AWARE OF THESE APPTS.

## 2018-04-08 ENCOUNTER — Encounter: Payer: Self-pay | Admitting: Radiation Oncology

## 2018-04-08 ENCOUNTER — Ambulatory Visit
Admission: RE | Admit: 2018-04-08 | Discharge: 2018-04-08 | Disposition: A | Discharge status: Home / Self Care | Payer: Medicare Other | Source: Ambulatory Visit | Attending: Radiation Oncology | Admitting: Radiation Oncology

## 2018-04-08 ENCOUNTER — Other Ambulatory Visit: Payer: Self-pay

## 2018-04-08 ENCOUNTER — Ambulatory Visit (HOSPITAL_COMMUNITY)
Admission: RE | Admit: 2018-04-08 | Discharge: 2018-04-08 | Disposition: A | Discharge status: Home / Self Care | Payer: Medicare Other | Source: Ambulatory Visit | Attending: Urology | Admitting: Urology

## 2018-04-08 ENCOUNTER — Encounter: Payer: Self-pay | Admitting: Medical Oncology

## 2018-04-08 VITALS — BP 126/79 | HR 58 | Temp 97.8°F | Resp 18 | Ht 65.0 in | Wt 115.6 lb

## 2018-04-08 DIAGNOSIS — C61 Malignant neoplasm of prostate: Secondary | ICD-10-CM | POA: Insufficient documentation

## 2018-04-08 DIAGNOSIS — R350 Frequency of micturition: Secondary | ICD-10-CM | POA: Diagnosis not present

## 2018-04-08 DIAGNOSIS — Z510 Encounter for antineoplastic radiation therapy: Secondary | ICD-10-CM | POA: Insufficient documentation

## 2018-04-08 DIAGNOSIS — Z79899 Other long term (current) drug therapy: Secondary | ICD-10-CM | POA: Insufficient documentation

## 2018-04-08 DIAGNOSIS — Z923 Personal history of irradiation: Secondary | ICD-10-CM | POA: Insufficient documentation

## 2018-04-08 DIAGNOSIS — R3911 Hesitancy of micturition: Secondary | ICD-10-CM | POA: Insufficient documentation

## 2018-04-08 NOTE — Progress Notes (Signed)
Received patient in the clinic following post seed simulation. Weight and vitals stable. Denies pain. Pre seed IPSS 10. Post seed IPSS 3. Patient reports he presented to the ER on Sunday unable to void. Explains a urinary catheter was placed. Reports Dr. Karsten Ro removed his catheter on Monday and started him on Flomax. Denies hematuria, urinary leakage or incontinence. Reports occasional dysuria. Denies diarrhea. Scheduled for MRI today at 1445 to confirm SpaceOar placement. Scheduled to follow up with Dr. Simone Curia office on 04/15/2018.   BP 126/79 (BP Location: Right Arm, Patient Position: Sitting, Cuff Size: Normal)   Pulse (!) 58   Temp 97.8 F (36.6 C) (Oral)   Resp 18   Ht 5\' 5"  (1.651 m)   Wt 115 lb 9.6 oz (52.4 kg)   SpO2 97%   BMI 19.24 kg/m  Wt Readings from Last 3 Encounters:  04/08/18 115 lb 9.6 oz (52.4 kg)  03/27/18 122 lb (55.3 kg)  03/25/18 122 lb 8 oz (55.6 kg)

## 2018-04-08 NOTE — Progress Notes (Signed)
Kyle Hamilton states the brachytherapy procedure went well but 2 days later he had urinary retention and went to the ER. He had a catheter placed that was removed the next day. He was started on Flomax and this helped him be able to void. He states that his urinary symptoms are improving everyday. He has follow up appointment next week with Dr. Karsten Ro.

## 2018-04-14 ENCOUNTER — Encounter: Payer: Self-pay | Admitting: Radiation Oncology

## 2018-04-14 ENCOUNTER — Ambulatory Visit: Payer: Medicare Other | Attending: Radiation Oncology | Admitting: Radiation Oncology

## 2018-04-14 DIAGNOSIS — C61 Malignant neoplasm of prostate: Secondary | ICD-10-CM | POA: Diagnosis not present

## 2018-04-15 DIAGNOSIS — C61 Malignant neoplasm of prostate: Secondary | ICD-10-CM | POA: Diagnosis not present

## 2018-04-23 NOTE — Progress Notes (Signed)
  Radiation Oncology         [phone]) [phone] ________________________________  Name: Kyle Hamilton MRN: [id number]  Date: 04/14/2018  DOB: 1955-12-25  3D Planning Note   Prostate Brachytherapy Post-Implant Dosimetry  Diagnosis: 62 y.o. 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 He underwent CT scan complex simulation to delineate the three-dimensional structures of the pelvis and demonstrate the radiation distribution.  Since that time, the seed localization, and complex isodose planning with dose volume histograms have now been completed.  Results:   Prostate Coverage - The dose of radiation delivered to the 90% or more of the prostate gland (D90) was 102.12% of the prescription dose. This exceeds our goal of greater than 90%. Rectal Sparing - The volume of rectal tissue receiving the prescription dose or higher was 0.0 cc. This falls under our thresholds tolerance of 1.0 cc.  Impression: The prostate seed implant appears to show adequate target coverage and appropriate rectal sparing.  Plan:  The patient will continue to follow with urology for ongoing PSA determinations. I would anticipate a high likelihood for local tumor control with minimal risk for rectal morbidity.  ________________________________  Sheral Apley Tammi Klippel, M.D.

## 2018-04-25 ENCOUNTER — Other Ambulatory Visit: Payer: Self-pay | Admitting: Cardiothoracic Surgery

## 2018-04-25 DIAGNOSIS — R911 Solitary pulmonary nodule: Secondary | ICD-10-CM

## 2018-05-25 ENCOUNTER — Encounter: Payer: Self-pay | Admitting: Cardiothoracic Surgery

## 2018-05-25 ENCOUNTER — Ambulatory Visit (INDEPENDENT_AMBULATORY_CARE_PROVIDER_SITE_OTHER): Payer: Medicare Other | Admitting: Cardiothoracic Surgery

## 2018-05-25 ENCOUNTER — Other Ambulatory Visit: Payer: Self-pay

## 2018-05-25 ENCOUNTER — Ambulatory Visit
Admission: RE | Admit: 2018-05-25 | Discharge: 2018-05-25 | Disposition: A | Discharge status: Home / Self Care | Payer: Medicare Other | Source: Ambulatory Visit | Attending: Cardiothoracic Surgery | Admitting: Cardiothoracic Surgery

## 2018-05-25 VITALS — BP 125/69 | HR 69 | Resp 16 | Ht 65.0 in | Wt 122.6 lb

## 2018-05-25 DIAGNOSIS — R911 Solitary pulmonary nodule: Secondary | ICD-10-CM | POA: Diagnosis not present

## 2018-05-25 DIAGNOSIS — C3412 Malignant neoplasm of upper lobe, left bronchus or lung: Secondary | ICD-10-CM

## 2018-05-25 DIAGNOSIS — J439 Emphysema, unspecified: Secondary | ICD-10-CM | POA: Diagnosis not present

## 2018-05-25 MED ORDER — IOPAMIDOL (ISOVUE-300) INJECTION 61%
75.0000 mL | Freq: Once | INTRAVENOUS | Status: AC | PRN
  Administered 2018-05-25: 75 mL via INTRAVENOUS

## 2018-05-25 NOTE — Progress Notes (Signed)
PCP is Cari Caraway, MD Referring Provider is Izora Gala, MD  Chief Complaint  Patient presents with  . Lung Lesion    6 month f/u with Chest CT surveillance on right middle lobe nodule      HPI: Patient presents for routine follow-up with CT scan of chest.  Patient had left upper lobe segmental resection for stage I lung cancer 3.5 years ago.  He remains a non-smoker.  He remains asymptomatic.  Today's CT scan shows the right middle lobe nodule to have increased in size 1 to 2 mm.  Still remains less than 1 cm, too small to biopsy.  Left lung shows no evidence of recurrent malignancy.  Patient is doing well but had a recent seed implantation for prostate cancer.  Past Medical History:  Diagnosis Date  . Chronic mastoiditis, bilateral   . COPD (chronic obstructive pulmonary disease) ([place])   . Hearing loss   . Hypertension   . Lung cancer ([place])   . Pneumothorax, left    History of post op  . Prostate cancer ([place])   . Shortness of breath dyspnea    on exertion  . Spinal stenosis     Past Surgical History:  Procedure Laterality Date  . BAHA REVISION Right 08/30/2014   Procedure: BONE ANCHORED HEARING AID (BAHA) REVISION RIGHT SIDE;  Surgeon: Izora Gala, MD;  Location: [place] [facility];  Service: ENT;  Laterality: Right;  . BONE ANCHORED HEARING AID IMPLANT  08/03/2012   Procedure: BONE ANCHORED HEARING AID (BAHA) IMPLANT;  Surgeon: Izora Gala, MD;  Location: [place];  Service: ENT;  Laterality: Right;  . COLONOSCOPY  2007  . CYSTOSCOPY  03/25/2018   Procedure: CYSTOSCOPY;  Surgeon: Kathie Rhodes, MD;  Location: [facility];  Service: Urology;;  no seeds noted in bladder  . LEG SURGERY     right leg deep cut  . MOUTH SURGERY    . PROSTATE BIOPSY    . RADIOACTIVE SEED IMPLANT N/A 03/25/2018   Procedure: RADIOACTIVE SEED IMPLANT/BRACHYTHERAPY IMPLANT;  Surgeon: Kathie Rhodes, MD;  Location: [facility];  Service: Urology;  Laterality: N/A;  71 seeds implanted   . SPACE OAR INSTILLATION N/A 03/25/2018   Procedure: SPACE OAR INSTILLATION;  Surgeon: Kathie Rhodes, MD;  Location: [facility];  Service: Urology;  Laterality: N/A;  . STAPEDES SURGERY     bilateral  . VIDEO ASSISTED THORACOSCOPY (VATS)/ LOBECTOMY Left 10/09/2014   Procedure: VIDEO ASSISTED THORACOSCOPY (VATS)/ LOBECTOMY;  Surgeon: Ivin Poot, MD;  Location: [facility];  Service: Thoracic;  Laterality: Left;    Family History  Problem Relation Age of Onset  . Cancer Neg Hx     Social History Social History   Tobacco Use  . Smoking status: Former Smoker    Packs/day: 1.00    Years: 35.00    Pack years: 35.00    Types: Cigarettes    Last attempt to quit: 05/11/2014    Years since quitting: 4.0  . Smokeless tobacco: Never Used  Substance Use Topics  . Alcohol use: No  . Drug use: No    Current Outpatient Medications  Medication Sig Dispense Refill  . budesonide-formoterol (SYMBICORT) 160-4.5 MCG/ACT inhaler Inhale 2 puffs into the lungs 2 (two) times daily. (Patient taking differently: Inhale 2 puffs into the lungs 2 (two) times daily as needed (wheezing). ) 1 Inhaler 1  . hydrochlorothiazide (MICROZIDE) 12.5 MG capsule TK 1 C PO QD IN THE MORNING  1  . tamsulosin (  FLOMAX) 0.4 MG CAPS capsule Take 0.4 mg by mouth.     No current facility-administered medications for this visit.     No Known Allergies  Review of Systems  Weight stable No chest pain No shortness of breath No edema No blood per rectum Passing urine adequately with Flomax  BP 125/69 (BP Location: Right Arm, Patient Position: Sitting, Cuff Size: Normal)   Pulse 69   Resp 16   Ht 5\' 5"  (1.651 m)   Wt 122 lb 9.6 oz (55.6 kg)   SpO2 93% Comment: RA  BMI 20.40 kg/m  Physical Exam      Exam    General- alert and comfortable    Neck- no JVD, no cervical adenopathy palpable, no carotid bruit   Lungs- clear without rales, wheezes   Cor- regular rate and rhythm, no murmur , gallop    Abdomen- soft, non-tender   Extremities - warm, non-tender, minimal edema   Neuro- oriented, appropriate, no focal weakness   Diagnostic Tests: CT scan personally reviewed.  The right middle lobe nodule attached to a air cyst is increased in size from 6 mm to 8 mm. Left lung remains clear of recurrent cancer.  Impression: Possible slow-growing bronchogenic carcinoma and right middle lobe.  Too small to access for biopsy.  We will hold off on PET scan.  Will follow with repeat CT scan in 6 months.  Plan: Return in 6 months with CT scan of chest to assess right middle lobe.   [name], MD [facility] [phone]

## 2018-10-17 ENCOUNTER — Other Ambulatory Visit: Payer: Self-pay | Admitting: *Deleted

## 2018-10-17 DIAGNOSIS — R911 Solitary pulmonary nodule: Secondary | ICD-10-CM

## 2018-10-24 DIAGNOSIS — C3432 Malignant neoplasm of lower lobe, left bronchus or lung: Secondary | ICD-10-CM | POA: Diagnosis not present

## 2018-10-24 DIAGNOSIS — E559 Vitamin D deficiency, unspecified: Secondary | ICD-10-CM | POA: Diagnosis not present

## 2018-10-24 DIAGNOSIS — Z86010 Personal history of colonic polyps: Secondary | ICD-10-CM | POA: Diagnosis not present

## 2018-10-24 DIAGNOSIS — J449 Chronic obstructive pulmonary disease, unspecified: Secondary | ICD-10-CM | POA: Diagnosis not present

## 2018-10-24 DIAGNOSIS — C61 Malignant neoplasm of prostate: Secondary | ICD-10-CM | POA: Diagnosis not present

## 2018-10-24 DIAGNOSIS — I10 Essential (primary) hypertension: Secondary | ICD-10-CM | POA: Diagnosis not present

## 2018-11-30 ENCOUNTER — Other Ambulatory Visit: Payer: Self-pay

## 2018-11-30 ENCOUNTER — Ambulatory Visit
Admission: RE | Admit: 2018-11-30 | Discharge: 2018-11-30 | Disposition: A | Discharge status: Home / Self Care | Payer: Medicare Other | Source: Ambulatory Visit | Attending: Cardiothoracic Surgery | Admitting: Cardiothoracic Surgery

## 2018-11-30 ENCOUNTER — Encounter: Payer: Self-pay | Admitting: Cardiothoracic Surgery

## 2018-11-30 ENCOUNTER — Ambulatory Visit (INDEPENDENT_AMBULATORY_CARE_PROVIDER_SITE_OTHER): Payer: Medicare Other | Admitting: Cardiothoracic Surgery

## 2018-11-30 VITALS — BP 124/80 | HR 56 | Resp 16 | Ht 65.0 in | Wt 129.2 lb

## 2018-11-30 DIAGNOSIS — C3412 Malignant neoplasm of upper lobe, left bronchus or lung: Secondary | ICD-10-CM

## 2018-11-30 DIAGNOSIS — Z09 Encounter for follow-up examination after completed treatment for conditions other than malignant neoplasm: Secondary | ICD-10-CM | POA: Diagnosis not present

## 2018-11-30 DIAGNOSIS — J439 Emphysema, unspecified: Secondary | ICD-10-CM | POA: Diagnosis not present

## 2018-11-30 DIAGNOSIS — R911 Solitary pulmonary nodule: Secondary | ICD-10-CM | POA: Diagnosis not present

## 2018-11-30 MED ORDER — IOPAMIDOL (ISOVUE-300) INJECTION 61%
75.0000 mL | Freq: Once | INTRAVENOUS | Status: AC | PRN
  Administered 2018-11-30: 75 mL via INTRAVENOUS

## 2018-11-30 NOTE — Progress Notes (Signed)
PCP is Cari Caraway, MD Referring Provider is Izora Gala, MD  Chief Complaint  Patient presents with  . Routine Post Op    and f/u nodule s/p WEDGE RES. LLLobe 10/09/14 WITH ct chest    HPI: The patient returns for follow-up with CT scan of the chest.  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  He stopped smoking in 2015.  His surveillance scans now show no recurrence in the left lower lobe but a new slowly growing nodule in the right middle lobe now up to about 10 mm.  It is asymptomatic.  Last year the patient finished treatment for adenocarcinoma prostate with radioactive seed implantations under the direction of Dr. Tammi Klippel.  He is recovering fairly well from that.   Past Medical History:  Diagnosis Date  . Chronic mastoiditis, bilateral   . COPD (chronic obstructive pulmonary disease) ([place])   . Hearing loss   . Hypertension   . Lung cancer ([place])   . Pneumothorax, left    History of post op  . Prostate cancer ([place])   . Shortness of breath dyspnea    on exertion  . Spinal stenosis     Past Surgical History:  Procedure Laterality Date  . BAHA REVISION Right 08/30/2014   Procedure: BONE ANCHORED HEARING AID (BAHA) REVISION RIGHT SIDE;  Surgeon: Izora Gala, MD;  Location: [place];  Service: ENT;  Laterality: Right;  . BONE ANCHORED HEARING AID IMPLANT  08/03/2012   Procedure: BONE ANCHORED HEARING AID (BAHA) IMPLANT;  Surgeon: Izora Gala, MD;  Location: [place];  Service: ENT;  Laterality: Right;  . COLONOSCOPY  2007  . CYSTOSCOPY  03/25/2018   Procedure: CYSTOSCOPY;  Surgeon: Kathie Rhodes, MD;  Location: [facility];  Service: Urology;;  no seeds noted in bladder  . LEG SURGERY     right leg deep cut  . MOUTH SURGERY    . PROSTATE BIOPSY    . RADIOACTIVE SEED IMPLANT N/A 03/25/2018   Procedure: RADIOACTIVE SEED IMPLANT/BRACHYTHERAPY IMPLANT;  Surgeon: Kathie Rhodes,  MD;  Location: [facility];  Service: Urology;  Laterality: N/A;  71 seeds implanted  . SPACE OAR INSTILLATION N/A 03/25/2018   Procedure: SPACE OAR INSTILLATION;  Surgeon: Kathie Rhodes, MD;  Location: [facility];  Service: Urology;  Laterality: N/A;  . STAPEDES SURGERY     bilateral  . VIDEO ASSISTED THORACOSCOPY (VATS)/ LOBECTOMY Left 10/09/2014   Procedure: VIDEO ASSISTED THORACOSCOPY (VATS)/ LOBECTOMY;  Surgeon: Ivin Poot, MD;  Location: [facility] OR;  Service: Thoracic;  Laterality: Left;    Family History  Problem Relation Age of Onset  . Cancer Neg Hx     Social History Social History   Tobacco Use  . Smoking status: Former Smoker    Packs/day: 1.00    Years: 35.00    Pack years: 35.00    Types: Cigarettes    Last attempt to quit: 05/11/2014    Years since quitting: 4.5  . Smokeless tobacco: Never Used  Substance Use Topics  . Alcohol use: No  . Drug use: No    Current Outpatient Medications  Medication Sig Dispense Refill  . budesonide-formoterol (SYMBICORT) 160-4.5 MCG/ACT inhaler Inhale 2 puffs into the lungs 2 (two) times daily. (Patient taking differently: Inhale 2 puffs into the lungs 2 (two) times daily as needed (wheezing). ) 1 Inhaler 1  . hydrochlorothiazide (MICROZIDE) 12.5 MG  capsule TK 1 C PO QD IN THE MORNING  1  . tamsulosin (FLOMAX) 0.4 MG CAPS capsule Take 0.4 mg by mouth.     No current facility-administered medications for this visit.     No Known Allergies  Review of Systems   Patient's main complaint is left lateral lower leg pain which is not related to activity or position.  He feels it is probably due to lumbar disc disease and chronic low back problems. He denies chest pain, hemoptysis, weight loss, shortness of breath, edema, abdominal pain, difficulty swallowing  BP 124/80 (BP Location: Right Arm, Patient Position: Sitting, Cuff Size: Large)   Pulse (!) 56   Resp 16   Ht 5' 5" (1.651 m)   Wt 129 lb 3.2 oz  (58.6 kg)   SpO2 95% Comment: ON RA  BMI 21.50 kg/m  Physical Exam       Exam    General- alert and comfortable    Neck- no JVD, no cervical adenopathy palpable, no carotid bruit   Lungs- clear without rales, wheezes   Cor- regular rate and rhythm, no murmur , gallop   Abdomen- soft, non-tender   Extremities - warm, non-tender, minimal edema.  Positive pedal pulses.   Neuro- oriented, appropriate, no focal weakness    Diagnostic Tests: CT scan images personally reviewed.  This shows the right middle lobe density along the side of a small cyst to have increased from approximately 8-9 mm now to 10-11 mm.  No other areas of concern.  Impression: Probable slow-growing adenocarcinoma now on the contralateral side of his previous surgery.  The nodule remains very small.  It would be difficult to palpate the nodule at this point.  We will proceed with a PET scan to assess its malignant potential and then decide whether biopsy and SBRT versus wedge resection would be the best approach.  Plan: Return after PET scan.   [name], MD [facility] [phone]

## 2018-12-06 ENCOUNTER — Other Ambulatory Visit: Payer: Self-pay | Admitting: *Deleted

## 2018-12-06 DIAGNOSIS — R911 Solitary pulmonary nodule: Secondary | ICD-10-CM

## 2019-01-30 ENCOUNTER — Ambulatory Visit (HOSPITAL_COMMUNITY): Payer: Medicare Other

## 2019-02-01 ENCOUNTER — Encounter: Payer: Medicare Other | Admitting: Cardiothoracic Surgery

## 2019-03-06 ENCOUNTER — Ambulatory Visit (HOSPITAL_COMMUNITY): Payer: Medicare Other

## 2019-03-08 ENCOUNTER — Encounter: Payer: Medicare Other | Admitting: Cardiothoracic Surgery

## 2019-03-14 ENCOUNTER — Ambulatory Visit (HOSPITAL_COMMUNITY): Admission: RE | Admit: 2019-03-14 | Payer: Medicare Other | Source: Ambulatory Visit

## 2019-03-15 ENCOUNTER — Encounter: Payer: Medicare Other | Admitting: Cardiothoracic Surgery

## 2019-03-23 ENCOUNTER — Ambulatory Visit (HOSPITAL_COMMUNITY)
Admission: RE | Admit: 2019-03-23 | Discharge: 2019-03-23 | Disposition: A | Discharge status: Home / Self Care | Payer: Medicare Other | Source: Ambulatory Visit | Attending: Cardiothoracic Surgery | Admitting: Cardiothoracic Surgery

## 2019-03-23 ENCOUNTER — Other Ambulatory Visit: Payer: Self-pay

## 2019-03-23 DIAGNOSIS — R911 Solitary pulmonary nodule: Secondary | ICD-10-CM

## 2019-03-23 LAB — GLUCOSE, CAPILLARY: Glucose-Capillary: 91 mg/dL (ref 70–99)

## 2019-03-23 MED ORDER — FLUDEOXYGLUCOSE F - 18 (FDG) INJECTION
6.3900 | Freq: Once | INTRAVENOUS | Status: AC | PRN
  Administered 2019-03-23: 6.39 via INTRAVENOUS

## 2019-03-27 ENCOUNTER — Other Ambulatory Visit: Payer: Self-pay

## 2019-03-28 ENCOUNTER — Other Ambulatory Visit: Payer: Self-pay | Admitting: *Deleted

## 2019-03-28 ENCOUNTER — Ambulatory Visit (HOSPITAL_COMMUNITY): Payer: Medicare Other

## 2019-03-28 ENCOUNTER — Ambulatory Visit (INDEPENDENT_AMBULATORY_CARE_PROVIDER_SITE_OTHER): Payer: Medicare Other | Admitting: Cardiothoracic Surgery

## 2019-03-28 ENCOUNTER — Encounter: Payer: Self-pay | Admitting: Cardiothoracic Surgery

## 2019-03-28 VITALS — BP 118/78 | HR 65 | Temp 97.7°F | Resp 16 | Ht 65.0 in | Wt 129.0 lb

## 2019-03-28 DIAGNOSIS — J449 Chronic obstructive pulmonary disease, unspecified: Secondary | ICD-10-CM

## 2019-03-28 DIAGNOSIS — R911 Solitary pulmonary nodule: Secondary | ICD-10-CM

## 2019-03-28 MED ORDER — BUDESONIDE-FORMOTEROL FUMARATE 160-4.5 MCG/ACT IN AERO: 2.0000 | INHALATION_SPRAY | Freq: Two times a day (BID) | RESPIRATORY_TRACT | 4 refills | Status: DC | PRN

## 2019-03-28 NOTE — Progress Notes (Signed)
PCP is Cari Caraway, MD Referring Provider is Izora Gala, MD  Chief Complaint  Patient presents with  . Lung Lesion    f/u with PET 03/23/19   The patient is a 63 year old with fairly severe COPD-emphysema status post left upper lobe wedge resection for adenocarcinoma 5 years ago.  He now has a slowly growing right middle lobe nodule 1.2 cm.  He is non-smoker.  PET scan recently shows SUV 2.8 of the right lung nodule.  No activity on the left side.  No mediastinal activity or evidence of distant metastatic disease.  The patient's FEV1 5 years ago was 1.5 prior to his pulmonary resection.  This is probably a new adenocarcinoma.  I feel the best treatment for this gentleman would be stereotactic body radiation therapy to the right middle lobe nodule.  We will request IR to evaluate for a transthoracic needle biopsy.  If the patient is not felt to be candidate for biopsy at this time we will wait and follow this small slowly growing low activity nodule with follow-up CT scan in 3 months. The patient agrees with this plan. Past Medical History:  Diagnosis Date  . Chronic mastoiditis, bilateral   . COPD (chronic obstructive pulmonary disease) ([place])   . Hearing loss   . Hypertension   . Lung cancer ([place])   . Pneumothorax, left    History of post op  . Prostate cancer ([place])   . Shortness of breath dyspnea    on exertion  . Spinal stenosis     Past Surgical History:  Procedure Laterality Date  . BAHA REVISION Right 08/30/2014   Procedure: BONE ANCHORED HEARING AID (BAHA) REVISION RIGHT SIDE;  Surgeon: Izora Gala, MD;  Location: [place];  Service: ENT;  Laterality: Right;  . BONE ANCHORED HEARING AID IMPLANT  08/03/2012   Procedure: BONE ANCHORED HEARING AID (BAHA) IMPLANT;  Surgeon: Izora Gala, MD;  Location: [place];  Service: ENT;  Laterality: Right;  . COLONOSCOPY  2007  . CYSTOSCOPY  03/25/2018   Procedure: CYSTOSCOPY;  Surgeon: Kathie Rhodes, MD;  Location: [facility];   Service: Urology;;  no seeds noted in bladder  . LEG SURGERY     right leg deep cut  . MOUTH SURGERY    . PROSTATE BIOPSY    . RADIOACTIVE SEED IMPLANT N/A 03/25/2018   Procedure: RADIOACTIVE SEED IMPLANT/BRACHYTHERAPY IMPLANT;  Surgeon: Kathie Rhodes, MD;  Location: [facility];  Service: Urology;  Laterality: N/A;  71 seeds implanted  . SPACE OAR INSTILLATION N/A 03/25/2018   Procedure: SPACE OAR INSTILLATION;  Surgeon: Kathie Rhodes, MD;  Location: [facility];  Service: Urology;  Laterality: N/A;  . STAPEDES SURGERY     bilateral  . VIDEO ASSISTED THORACOSCOPY (VATS)/ LOBECTOMY Left 10/09/2014   Procedure: VIDEO ASSISTED THORACOSCOPY (VATS)/ LOBECTOMY;  Surgeon: Ivin Poot, MD;  Location: [facility] OR;  Service: Thoracic;  Laterality: Left;    Family History  Problem Relation Age of Onset  . Cancer Neg Hx     Social History Social History   Tobacco Use  . Smoking status: Former Smoker    Packs/day: 1.00    Years: 35.00    Pack years: 35.00    Types: Cigarettes    Last attempt to quit: 05/11/2014    Years since quitting: 4.8  . Smokeless tobacco: Never Used  Substance Use Topics  . Alcohol use: No  . Drug use: No    Current Outpatient Medications  Medication  Sig Dispense Refill  . budesonide-formoterol (SYMBICORT) 160-4.5 MCG/ACT inhaler Inhale 2 puffs into the lungs 2 (two) times daily. (Patient taking differently: Inhale 2 puffs into the lungs 2 (two) times daily as needed (wheezing). ) 1 Inhaler 1  . hydrochlorothiazide (MICROZIDE) 12.5 MG capsule TK 1 C PO QD IN THE MORNING  1  . tamsulosin (FLOMAX) 0.4 MG CAPS capsule Take 0.4 mg by mouth.     No current facility-administered medications for this visit.     No Known Allergies  Review of Systems  No weight loss No night sweats No pulmonary infections this past winter No chest pain No difficulty swallowing No headache dizziness or syncope  BP 118/78 (BP Location: Left Arm, Patient  Position: Sitting, Cuff Size: Normal)   Pulse 65   Temp 97.7 F (36.5 C) (Oral)   Resp 16   Ht 5\' 5"  (1.651 m)   Wt 129 lb (58.5 kg)   SpO2 93% Comment: RA  BMI 21.47 kg/m  Physical Exam      Exam    General- alert and comfortable    Neck- no JVD, no cervical adenopathy palpable, no carotid bruit   Lungs- clear without rales, wheezes   Cor- regular rate and rhythm, no murmur , gallop   Abdomen- soft, non-tender   Extremities - warm, non-tender, minimal edema   Neuro- oriented, appropriate, no focal weakness   Diagnostic Tests: PET scan images personally reviewed and counseled with patient. The slowly growing right middle lobe nodule is 1.2 cm and has SUV activity of 2.8.  It is probably another slow-growing adenocarcinoma.  There is no evidence of mediastinal or distant metastatic disease.  Impression: Plan will be for transthoracic needle aspirate of the nodule by IR then referral to radiation oncology for SBRT evaluation.  Plan: Patient will return after needle biopsy.   [name], MD [facility] [phone]

## 2019-03-31 ENCOUNTER — Ambulatory Visit (HOSPITAL_COMMUNITY): Payer: Medicare Other

## 2019-04-04 ENCOUNTER — Other Ambulatory Visit (HOSPITAL_COMMUNITY)
Admission: RE | Admit: 2019-04-04 | Discharge: 2019-04-04 | Disposition: A | Discharge status: Home / Self Care | Payer: Medicare Other | Source: Ambulatory Visit | Attending: Cardiothoracic Surgery | Admitting: Cardiothoracic Surgery

## 2019-04-04 DIAGNOSIS — Z1159 Encounter for screening for other viral diseases: Secondary | ICD-10-CM | POA: Insufficient documentation

## 2019-04-05 LAB — NOVEL CORONAVIRUS, NAA (HOSP ORDER, SEND-OUT TO REF LAB; TAT 18-24 HRS): SARS-CoV-2, NAA: NOT DETECTED

## 2019-04-06 ENCOUNTER — Other Ambulatory Visit: Payer: Self-pay | Admitting: Radiology

## 2019-04-07 ENCOUNTER — Ambulatory Visit (HOSPITAL_COMMUNITY): Admission: RE | Admit: 2019-04-07 | Payer: Medicare Other | Source: Ambulatory Visit

## 2019-04-07 ENCOUNTER — Encounter (HOSPITAL_COMMUNITY): Payer: Self-pay

## 2019-04-14 ENCOUNTER — Telehealth: Payer: Medicare Other | Admitting: Cardiothoracic Surgery

## 2019-04-14 ENCOUNTER — Other Ambulatory Visit: Payer: Self-pay

## 2019-04-14 ENCOUNTER — Telehealth: Payer: Self-pay | Admitting: Cardiothoracic Surgery

## 2019-04-14 NOTE — Telephone Encounter (Signed)
      [address]       [place] [address]             [phone]     CARDIOTHORACIC SURGERY TELEPHONE VIRTUAL OFFICE NOTE  Referring Provider is No ref. provider found Primary Cardiologist is No primary care provider on file. PCP is Cari Caraway, MD   HPI:  I spoke with Kyle Hamilton (DOB 23-Mar-1956 ) via telephone on 04/14/2019 at 5:09 PM. I discussed the therapy of the right upper lobe nodule with mild to moderate metabolic activity on PET scan that has been showing slow growth.  Interventional radiology has declined to do a biopsy because of risk of bleeding or pneumothorax.  The multidisciplinary thoracic oncology team has been contacted to consider empiric radiation therapy.  The patient has previously been treated by Dr. Ledon Snare with radiation for prostate cancer approximately 2 years ago.  Patient will be contacted regarding an appointment to discuss this with the oncology service.    Current Outpatient Medications  Medication Sig Dispense Refill  . budesonide-formoterol (SYMBICORT) 160-4.5 MCG/ACT inhaler Inhale 2 puffs into the lungs 2 (two) times daily as needed (wheezing). 1 Inhaler 4  . hydrochlorothiazide (MICROZIDE) 12.5 MG capsule TK 1 C PO QD IN THE MORNING  1  . tamsulosin (FLOMAX) 0.4 MG CAPS capsule Take 0.4 mg by mouth.     No current facility-administered medications for this visit.      Diagnostic Tests:  Recent PET scan images reviewed and have been discussed with the patient   Impression:  Progressively enlarging right upper lobe nodule with mild-moderate activity on PET scan in a patient with severe COPD was not a candidate for VATS wedge resection and who is had previous adenocarcinoma resected. He appears to be a appropriate candidate for empiric radiation therapy with a solitary nodule and no mediastinal involvement or evidence of distant metastatic disease Plan: Pending evaluation by radiation oncology.       04/14/2019  5:09 PM

## 2019-04-17 ENCOUNTER — Encounter: Payer: Self-pay | Admitting: *Deleted

## 2019-04-17 DIAGNOSIS — R911 Solitary pulmonary nodule: Secondary | ICD-10-CM

## 2019-04-17 NOTE — Progress Notes (Signed)
Oncology Nurse Navigator Documentation  Oncology Nurse Navigator Flowsheets 04/17/2019  Navigator Location [facility]  Referral date to RadOnc/MedOnc 04/14/2019  Navigator Encounter Type Other/I received referral from Dr. Darcey Nora and Dr. Tammi Klippel is aware.  I will update Rad Onc on referral.   Abnormal Finding Date -  Confirmed Diagnosis Date -  Surgery Date -  Treatment Initiated Date -  Treatment Phase Abnormal Scans  Barriers/Navigation Needs Coordination of Care  Education -  Interventions Coordination of Care  Coordination of Care Other  Education Method -  Support Groups/Services -  Acuity Level 1  Acuity Level 1 -  Acuity Level 2 -  Time Spent with Patient 30

## 2019-04-18 ENCOUNTER — Ambulatory Visit
Admission: RE | Admit: 2019-04-18 | Discharge: 2019-04-18 | Disposition: A | Discharge status: Home / Self Care | Payer: Medicare Other | Source: Ambulatory Visit | Attending: Urology | Admitting: Urology

## 2019-04-18 ENCOUNTER — Telehealth: Payer: Self-pay | Admitting: Medical Oncology

## 2019-04-18 ENCOUNTER — Ambulatory Visit
Admission: RE | Admit: 2019-04-18 | Discharge: 2019-04-18 | Disposition: A | Discharge status: Home / Self Care | Payer: Medicare Other | Source: Ambulatory Visit | Attending: Radiation Oncology | Admitting: Radiation Oncology

## 2019-04-18 ENCOUNTER — Other Ambulatory Visit: Payer: Self-pay

## 2019-04-18 ENCOUNTER — Encounter: Payer: Self-pay | Admitting: Urology

## 2019-04-18 DIAGNOSIS — R911 Solitary pulmonary nodule: Secondary | ICD-10-CM | POA: Insufficient documentation

## 2019-04-18 DIAGNOSIS — C342 Malignant neoplasm of middle lobe, bronchus or lung: Secondary | ICD-10-CM | POA: Diagnosis not present

## 2019-04-18 DIAGNOSIS — M899 Disorder of bone, unspecified: Secondary | ICD-10-CM | POA: Diagnosis not present

## 2019-04-18 DIAGNOSIS — Z923 Personal history of irradiation: Secondary | ICD-10-CM | POA: Diagnosis not present

## 2019-04-18 DIAGNOSIS — C61 Malignant neoplasm of prostate: Secondary | ICD-10-CM | POA: Diagnosis not present

## 2019-04-18 NOTE — Progress Notes (Signed)
Radiation Oncology         [phone] ________________________________  Outpatient Re-Consultation - Conducted via telephone due to current COVID-19 concerns for limiting patient exposure  Name: Kyle Hamilton MRN: [id number]  Date of Service: 04/18/2019 DOB: [birth year]  WU:[id number], Abigail Butts, MD  Ivin Poot, MD   REFERRING PHYSICIAN: Prescott Gum, Collier [place], MD  DIAGNOSIS: 63 y.o. male with Stage I NSCLC in the right middle lobe lung.    ICD-10-CM   1. Primary cancer of right middle lobe of lung (HCC) C34.2     HISTORY OF PRESENT ILLNESS: Kyle Hamilton is a 63 y.o. male seen at the request of Dr. Prescott Gum. The patient has a history of Stage 1, NSCLC, adenocarcinoma of the LUL in 2015 status post wedge resection with Dr. Prescott Gum. Nodes and margins were negative for malignancy and molecular testing at that time proved EGFR positive. He has continued in close observation with Dr. Prescott Gum with serial CT scans every 6 months which have shown slow growth of a right middle lobe nodule since first noted at 2 mm in 2016. His most recent chest CT scan in January 2020 showed continued enlargement of the right middle lobe nodule, measuring 11 mm, previously 9 mm on July 2019 CT. This was further evaluated with PET scan on 03/23/2019 which showed the right middle lobe nodule, 11 mm hypermetabolic with SUV max of 2.7, consistent with bronchogenic carcinoma. No evidence of mediastinal adenopathy or evidence of local recurrence in the left lung.       Dr. Prescott Gum presented his case and imaging at recent multidisciplinary thoracic oncology conference and the patient was not felt to be a suitable candidate for biopsy or surgical intervention given his extensive underlying COPD/emphysema. Therefore, the patient has kindly been referred today for consideration of stereotactic body radiation therapy to the right middle lobe nodule.    Of note, the patient has a history of Gleason 3+4 adenocarcinoma of the prostate  with pre-treatment PSA of 7.7, treated with brachytherapy in May 2019 and has not had recent follow-up with Dr. Karsten Ro for surveillance of his prostate cancer.  We have reached out to [facility] and coordinated a follow-up visit with Dr. Karsten Ro for 04/25/2019 at 11 AM.  Noted on his CT chest imaging from November 30, 2018 was a new sclerotic lesion in the inferior aspect of the manubrium with what appears to be a nondisplaced fracture through this region, highly concerning for a metastatic lesion but recommendation was for correlation with current PSA.  If his PSA remains low, this could simply represent a healing, nondisplaced fracture but the appearance is concerning for potential pathologic fracture.  PREVIOUS RADIATION THERAPY: Yes - 03/25/18: Insertion of radioactive I-125 seeds into the prostate gland, 145 Gy definitive therapy, with SpaceOAR  PAST MEDICAL HISTORY:  Past Medical History:  Diagnosis Date   Chronic mastoiditis, bilateral    COPD (chronic obstructive pulmonary disease) (HCC)    Hearing loss    Hypertension    Lung cancer ([place])    Pneumothorax, left    History of post op   Prostate cancer ([place])    Shortness of breath dyspnea    on exertion   Spinal stenosis       PAST SURGICAL HISTORY: Past Surgical History:  Procedure Laterality Date   BAHA REVISION Right 08/30/2014   Procedure: BONE ANCHORED HEARING AID (BAHA) REVISION RIGHT SIDE;  Surgeon: Izora Gala, MD;  Location: [place];  Service: ENT;  Laterality: Right;  BONE ANCHORED HEARING AID IMPLANT  08/03/2012   Procedure: BONE ANCHORED HEARING AID (BAHA) IMPLANT;  Surgeon: Izora Gala, MD;  Location: [place];  Service: ENT;  Laterality: Right;   COLONOSCOPY  2007   CYSTOSCOPY  03/25/2018   Procedure: CYSTOSCOPY;  Surgeon: Kathie Rhodes, MD;  Location: [facility] [place];  Service: Urology;;  no seeds noted in bladder   LEG SURGERY     right leg deep cut   [place] N/A 03/25/2018   Procedure: RADIOACTIVE SEED IMPLANT/BRACHYTHERAPY IMPLANT;  Surgeon: Kathie Rhodes, MD;  Location: [place];  Service: Urology;  Laterality: N/A;  71 seeds implanted   SPACE OAR INSTILLATION N/A 03/25/2018   Procedure: SPACE OAR INSTILLATION;  Surgeon: Kathie Rhodes, MD;  Location: [facility];  Service: Urology;  Laterality: N/A;   STAPEDES SURGERY     bilateral   VIDEO ASSISTED THORACOSCOPY (VATS)/ LOBECTOMY Left 10/09/2014   Procedure: VIDEO ASSISTED THORACOSCOPY (VATS)/ LOBECTOMY;  Surgeon: Ivin Poot, MD;  [facility] OR;  Service: Thoracic;  Laterality: Left;    FAMILY HISTORY:  Family History  Problem Relation Age of Onset   Cancer Neg Hx     SOCIAL HISTORY:  Social History   Socioeconomic History   Marital status: Single    Spouse name: Not on file   Number of children: 3   Years of education: Not on file   Highest education level: Not on file  Occupational History   Occupation: retired    Comment: Paramedic strain: Not on file   Food insecurity:    Worry: Not on file    Inability: Not on file   Transportation needs:    Medical: Not on file    Non-medical: Not on file  Tobacco Use   Smoking status: Former Smoker    Packs/day: 1.00    Years: 35.00    Pack years: 35.00    Types: Cigarettes    Last attempt to quit: 05/11/2014    Years since quitting: 4.9   Smokeless tobacco: Never Used  Substance and Sexual Activity   Alcohol use: No   Drug use: No   Sexual activity: Never  Lifestyle   Physical activity:    Days per week: Not on file    Minutes per session: Not on file   Stress: Not on file  Relationships   Social connections:    Talks on phone: Not on file    Gets together: Not on file    Attends religious service: Not on file    Active member of club or organization: Not on file    Attends meetings of clubs or  organizations: Not on file    Relationship status: Not on file   Intimate partner violence:    Fear of current or ex partner: Not on file    Emotionally abused: Not on file    Physically abused: Not on file    Forced sexual activity: Not on file  Other Topics Concern   Not on file  Social History Narrative   Not on file    ALLERGIES: Patient has no known allergies.  MEDICATIONS:  Current Outpatient Medications  Medication Sig Dispense Refill   budesonide-formoterol (SYMBICORT) 160-4.5 MCG/ACT inhaler Inhale 2 puffs into the lungs 2 (two) times daily as needed (wheezing). 1 Inhaler 4   hydrochlorothiazide (MICROZIDE) 12.5 MG capsule  TK 1 C PO QD IN THE MORNING  1   tamsulosin (FLOMAX) 0.4 MG CAPS capsule Take 0.4 mg by mouth.     No current facility-administered medications for this encounter.     REVIEW OF SYSTEMS:  On review of systems, the patient reports that he is doing well overall. He denies any chest pain, shortness of breath, cough, fevers, chills, night sweats, or unintended weight changes. He denies any bowel disturbances, and denies abdominal pain, nausea or vomiting. He reports persistent urinary issues since his prostate brachytherapy. He denies any new musculoskeletal or joint aches or pains. A complete review of systems is obtained and is otherwise negative.    PHYSICAL EXAM: Not performed in light of telephone consultation. Wt Readings from Last 3 Encounters:  03/28/19 129 lb (58.5 kg)  11/30/18 129 lb 3.2 oz (58.6 kg)  05/25/18 122 lb 9.6 oz (55.6 kg)   Temp Readings from Last 3 Encounters:  03/28/19 97.7 F (36.5 C) (Oral)  04/08/18 97.8 F (36.6 C) (Oral)  03/25/18 (!) 96.8 F (36 C) (Oral)   BP Readings from Last 3 Encounters:  03/28/19 118/78  11/30/18 124/80  05/25/18 125/69   Pulse Readings from Last 3 Encounters:  03/28/19 65  11/30/18 (!) 56  05/25/18 69   Pain Assessment Pain Score: 0-No pain/10   LABORATORY DATA:  Lab Results    Component Value Date   WBC 6.2 03/18/2018   HGB 14.8 03/18/2018   HCT 44.4 03/18/2018   MCV 92.7 03/18/2018   PLT 284 03/18/2018   Lab Results  Component Value Date   NA 142 03/18/2018   K 3.5 03/18/2018   CL 102 03/18/2018   CO2 28 03/18/2018   Lab Results  Component Value Date   ALT 13 (L) 03/18/2018   AST 24 03/18/2018   ALKPHOS 63 03/18/2018   BILITOT 0.9 03/18/2018     RADIOGRAPHY: Nm Pet Image Restag (ps) Skull Base To Thigh  Result Date: 03/23/2019 CLINICAL DATA:  Initial treatment strategy for pulmonary nodule. EXAM: NUCLEAR MEDICINE PET SKULL BASE TO THIGH TECHNIQUE: 6.4 mCi F-18 FDG was injected intravenously. Full-ring PET imaging was performed from the skull base to thigh after the radiotracer. CT data was obtained and used for attenuation correction and anatomic localization. Fasting blood glucose: 91 mg/dl COMPARISON:  None. FINDINGS: Mediastinal blood pool activity: SUV max 1.95 Liver activity: SUV max NA NECK: No hypermetabolic lymph nodes in the neck. Incidental CT findings: none CHEST: Nodular lesion in the RIGHT upper lobe measures 11 mm (image 74/4) and has associated metabolic activity with SUV max equal 2.7. No hypermetabolic mediastinal lymph nodes. Large calcified lymph node adjacent to the LEFT mainstem bronchus. Mild metabolic activity associated with the length of the esophagus is favored benign. Within the LEFT lower lobe postsurgical change consistent with wedge resection of prior hypermetabolic nodule. No evidence of local recurrence in the LEFT lung. Incidental CT findings: none ABDOMEN/PELVIS: No abnormal hypermetabolic activity within the liver, pancreas, adrenal glands, or spleen. No hypermetabolic lymph nodes in the abdomen or pelvis. Incidental CT findings: Atherosclerotic calcification of the aorta. Brachytherapy seeds in the prostate gland. No hypermetabolic activity localized SKELETON: No focal hypermetabolic activity to suggest skeletal metastasis.  Incidental CT findings: none IMPRESSION: 1. Hypermetabolic RIGHT middle lobe nodule consistent with bronchogenic carcinoma. No evidence of mediastinal adenopathy. PET-CT staging: T1a N0 M0 2. Postsurgical change in the LEFT lower lobe consistent with nodule resection. No evidence local recurrence. 3. Diffuse activity in the esophagus  consistent with esophagitis. Electronically Signed   By: Suzy Bouchard M.D.   On: 03/23/2019 14:25      IMPRESSION/PLAN: 1. 63 y.o. male with Stage I NSCLC in the right middle lobe lung.  Today, we talked to the patient about the findings and workup thus far. We discussed the natural history of non-small cell lung carcinoma and general treatment, highlighting the role of stereotactic body radiotherapy (SBRT) in the management.  We discussed the recommendation to proceed with radiation treatment of curative intent without formal tissue diagnosis as he is felt to be high risk for potential pneumothorax or other complications associated with biopsy and/or surgical intervention.  We discussed the available radiation techniques, and focused on the details of logistics and delivery.  The recommendation is to proceed with a 3-5 fraction course of SBRT directed to the nodule in the right middle lobe.  We reviewed the anticipated acute and late sequelae associated with radiation in this setting. The patient was encouraged to ask questions that were answered to his stated satisfaction.  At the end of the conversation, the patient would like to proceed with SBRT to the RML nodule as recommended. He has provided verbal consent to proceed and is scheduled for CT simulation on Friday, June 12th at 9:00 AM in anticipation of beginning his treatments in the near future.  We we will share our conversation with Dr. Prescott Gum and will obtain formal written consent at the time of his simulation/treatment planning.  He appears to have a good understanding of his disease and our recommendations  and is comfortable and in agreement with the stated plan.  2. New sclerotic lesion in the inferior aspect of the manubrium with what appears to be a nondisplaced fracture through this region, highly concerning for a metastatic lesion seen on CT Chest 11/2018.  Recommendation is for correlation with his PSA given his history of prostate cancer.  Unfortunately, he has not followed up with Urology since his seed implant procedure in 03/2018 so we do not have any current PSA for correlation. We have reached out to [facility] and coordinated a follow-up visit with Dr. Karsten Ro for 04/25/2019 at 11 AM.  Further evaluation/management will be determined based on those results.  Given current concerns for patient exposure during the COVID-19 pandemic, this encounter was conducted via telephone. The patient was notified in advance and was offered a [place] meeting to allow for face to face communication but unfortunately reported that he did not have the appropriate resources/technology to support such a visit and instead preferred to proceed with telephone consult. The patient has given verbal consent for this type of encounter. The time spent during this encounter was 45 minutes and approximately 50% of that time was spent in the coordination of his care. The attendants for this meeting include [name] MD, Ashlyn Bruning PA-C, Rae Lips- scribe, and patient, Kyle Hamilton. During the encounter, [name] MD, Ashlyn Bruning PA-C, and scribe, Rae Lips were located at [facility] Radiation Oncology Department.  Patient, Kyle Hamilton was located at home.   Nicholos Johns, PA-C    [name], MD  [facility] Direct Dial: [phone]   Fax: [phone] [email or web address]   Skype   [email or web address]  This document serves as a record of services personally performed by [name], MD and Freeman Caldron, PA-C. It was created on their behalf by Rae Lips, a trained medical scribe. The creation of this record is based  on the scribe's personal observations and the providers' statements to them. This document has been checked and approved by the attending providers.

## 2019-04-18 NOTE — Telephone Encounter (Signed)
Left message to inform patient of appointment with Dr. Legrand [facility] 6/16 @ 11:00 am. I asked him to call me back to confirm.

## 2019-04-18 NOTE — Telephone Encounter (Signed)
I called [facility] to request a follow up appointment with Dr. Karsten Ro. Appointment given for 6/16 @ 11:00 am. I will notify patient. Ashlyn-PA notified.

## 2019-04-18 NOTE — Telephone Encounter (Signed)
Mr. Kattner returned call to confirm appointment with Dr. Karsten Ro 6/16 @11 :00 am.

## 2019-04-20 NOTE — Progress Notes (Signed)
  Radiation Oncology         [phone]) [phone] ________________________________  Name: Kyle Hamilton MRN: [id number]  Date: 04/21/2019  DOB: 11/11/1955  STEREOTACTIC BODY RADIOTHERAPY SIMULATION AND TREATMENT PLANNING NOTE    ICD-10-CM   1. Primary cancer of right middle lobe of lung (HCC)  C34.2     DIAGNOSIS:  63 y.o. male with Stage I NSCLC in the right middle lobe lung.  NARRATIVE:  The patient was brought to the [facility].  Identity was confirmed.  All relevant records and images related to the planned course of therapy were reviewed.  The patient freely provided informed written consent to proceed with treatment after reviewing the details related to the planned course of therapy. The consent form was witnessed and verified by the simulation staff.  Then, the patient was set-up in a stable reproducible  supine position for radiation therapy.  A BodyFix immobilization pillow was fabricated for reproducible positioning.  Then I personally applied the abdominal compression paddle to limit respiratory excursion.  4D respiratoy motion management CT images were obtained.  Surface markings were placed.  The CT images were loaded into the planning software.  Then, using Cine, MIP, and standard views, the internal target volume (ITV) and planning target volumes (PTV) were delinieated, and avoidance structures were contoured.  Treatment planning then occurred.  The radiation prescription was entered and confirmed.  A total of two complex treatment devices were fabricated in the form of the BodyFix immobilization pillow and a neck accuform cushion.  I have requested : 3D Simulation  I have requested a DVH of the following structures: Heart, Lungs, Esophagus, Chest Wall, Brachial Plexus, Major Blood Vessels, and targets.  SPECIAL TREATMENT PROCEDURE:  The planned course of therapy using radiation constitutes a special treatment procedure. Special care is required in the management of this  patient for the following reasons. This treatment constitutes a Special Treatment Procedure for the following reason: [ High dose per fraction requiring special monitoring for increased toxicities of treatment including daily imaging..  The special nature of the planned course of radiotherapy will require increased physician supervision and oversight to ensure patient's safety with optimal treatment outcomes.  RESPIRATORY MOTION MANAGEMENT SIMULATION:  In order to account for effect of respiratory motion on target structures and other organs in the planning and delivery of radiotherapy, this patient underwent respiratory motion management simulation.  To accomplish this, when the patient was brought to the CT simulation planning suite, 4D respiratoy motion management CT images were obtained.  The CT images were loaded into the planning software.  Then, using a variety of tools including Cine, MIP, and standard views, the target volume and planning target volumes (PTV) were delineated.  Avoidance structures were contoured.  Treatment planning then occurred.  Dose volume histograms were generated and reviewed for each of the requested structure.  The resulting plan was carefully reviewed and approved today.  PLAN:  The patient will receive 54 Gy in 3 fraction.  ________________________________  Sheral Apley Tammi Klippel, M.D.  This document serves as a record of services personally performed by [name], MD. It was created on his behalf by Wilburn Mylar, a trained medical scribe. The creation of this record is based on the scribe's personal observations and the provider's statements to them. This document has been checked and approved by the attending provider.

## 2019-04-21 ENCOUNTER — Other Ambulatory Visit: Payer: Self-pay

## 2019-04-21 ENCOUNTER — Ambulatory Visit
Admission: RE | Admit: 2019-04-21 | Discharge: 2019-04-21 | Disposition: A | Discharge status: Home / Self Care | Payer: Medicare Other | Source: Ambulatory Visit | Attending: Radiation Oncology | Admitting: Radiation Oncology

## 2019-04-21 DIAGNOSIS — Z510 Encounter for antineoplastic radiation therapy: Secondary | ICD-10-CM | POA: Insufficient documentation

## 2019-04-21 DIAGNOSIS — C342 Malignant neoplasm of middle lobe, bronchus or lung: Secondary | ICD-10-CM | POA: Insufficient documentation

## 2019-04-24 ENCOUNTER — Telehealth: Payer: Self-pay | Admitting: Medical Oncology

## 2019-04-24 DIAGNOSIS — C342 Malignant neoplasm of middle lobe, bronchus or lung: Secondary | ICD-10-CM | POA: Diagnosis not present

## 2019-04-24 DIAGNOSIS — Z510 Encounter for antineoplastic radiation therapy: Secondary | ICD-10-CM | POA: Diagnosis not present

## 2019-04-24 NOTE — Telephone Encounter (Signed)
Holly-Dr. Ottelin's office called left a message requesting records for new patient referral. I called Holly and left her a message that he is not a new patient. I explained that he has not been seen since his brachytherapy post op visit and needs follow up. I faxed her Dr. Johny Shears consult note from 6/9 and asked her to call me if she needs further imformation.

## 2019-04-25 DIAGNOSIS — R9389 Abnormal findings on diagnostic imaging of other specified body structures: Secondary | ICD-10-CM | POA: Diagnosis not present

## 2019-04-25 DIAGNOSIS — Z8546 Personal history of malignant neoplasm of prostate: Secondary | ICD-10-CM | POA: Diagnosis not present

## 2019-04-26 ENCOUNTER — Encounter: Payer: Self-pay | Admitting: *Deleted

## 2019-04-27 DIAGNOSIS — C3432 Malignant neoplasm of lower lobe, left bronchus or lung: Secondary | ICD-10-CM | POA: Diagnosis not present

## 2019-04-27 DIAGNOSIS — E559 Vitamin D deficiency, unspecified: Secondary | ICD-10-CM | POA: Diagnosis not present

## 2019-04-27 DIAGNOSIS — J449 Chronic obstructive pulmonary disease, unspecified: Secondary | ICD-10-CM | POA: Diagnosis not present

## 2019-04-27 DIAGNOSIS — R809 Proteinuria, unspecified: Secondary | ICD-10-CM | POA: Diagnosis not present

## 2019-04-27 DIAGNOSIS — C61 Malignant neoplasm of prostate: Secondary | ICD-10-CM | POA: Diagnosis not present

## 2019-04-27 DIAGNOSIS — I10 Essential (primary) hypertension: Secondary | ICD-10-CM | POA: Diagnosis not present

## 2019-04-27 DIAGNOSIS — Z86010 Personal history of colonic polyps: Secondary | ICD-10-CM | POA: Diagnosis not present

## 2019-04-28 ENCOUNTER — Ambulatory Visit
Admission: RE | Admit: 2019-04-28 | Discharge: 2019-04-28 | Disposition: A | Discharge status: Home / Self Care | Payer: Medicare Other | Source: Ambulatory Visit | Attending: Radiation Oncology | Admitting: Radiation Oncology

## 2019-04-28 ENCOUNTER — Other Ambulatory Visit: Payer: Self-pay

## 2019-04-28 DIAGNOSIS — C342 Malignant neoplasm of middle lobe, bronchus or lung: Secondary | ICD-10-CM | POA: Diagnosis not present

## 2019-04-28 DIAGNOSIS — Z510 Encounter for antineoplastic radiation therapy: Secondary | ICD-10-CM | POA: Diagnosis not present

## 2019-05-01 ENCOUNTER — Encounter: Payer: Self-pay | Admitting: Urology
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
## 2019-05-02 ENCOUNTER — Other Ambulatory Visit: Payer: Self-pay

## 2019-05-02 ENCOUNTER — Ambulatory Visit
Admission: RE | Admit: 2019-05-02 | Discharge: 2019-05-02 | Disposition: A | Discharge status: Home / Self Care | Payer: Medicare Other | Source: Ambulatory Visit | Attending: Radiation Oncology | Admitting: Radiation Oncology

## 2019-05-02 DIAGNOSIS — C342 Malignant neoplasm of middle lobe, bronchus or lung: Secondary | ICD-10-CM | POA: Diagnosis not present

## 2019-05-02 DIAGNOSIS — Z510 Encounter for antineoplastic radiation therapy: Secondary | ICD-10-CM | POA: Diagnosis not present

## 2019-05-05 ENCOUNTER — Other Ambulatory Visit: Payer: Self-pay

## 2019-05-05 ENCOUNTER — Ambulatory Visit
Admission: RE | Admit: 2019-05-05 | Discharge: 2019-05-05 | Disposition: A | Discharge status: Home / Self Care | Payer: Medicare Other | Source: Ambulatory Visit | Attending: Radiation Oncology | Admitting: Radiation Oncology

## 2019-05-05 ENCOUNTER — Encounter: Payer: Self-pay | Admitting: Urology

## 2019-05-05 DIAGNOSIS — Z510 Encounter for antineoplastic radiation therapy: Secondary | ICD-10-CM | POA: Diagnosis not present

## 2019-05-05 DIAGNOSIS — C342 Malignant neoplasm of middle lobe, bronchus or lung: Secondary | ICD-10-CM | POA: Diagnosis not present

## 2019-05-05 NOTE — Progress Notes (Signed)
  Radiation Oncology         [phone]) [phone] ________________________________  Name: Kyle Hamilton MRN: [id number]  Date: 05/05/2019  DOB: 03-07-1956  End of Treatment Note  Diagnosis:   63 y.o. male with putative Stage I NSCLC in the right middle lobe lung.     Indication for treatment:  Curative, Definitive SBRT       Radiation treatment dates:   SBRT 04/28/19, 05/02/19, 05/05/19  Site/dose:   The RML target was treated to 54 Gy in 3 fractions of 18 Gy  Beams/energy:   The patient was treated using stereotactic body radiotherapy according to a 3D conformal radiotherapy plan.  Volumetric arc fields were employed to deliver 6 MV X-rays.  Image guidance was performed with per fraction cone beam CT prior to treatment under personal MD supervision.  Immobilization was achieved using BodyFix Pillow.  Narrative: The patient tolerated radiation treatment relatively well.   He denied any ill side effects associated with his treatment, specifically denied dysphagia, increased productive cough, chest pain or hemoptysis.  He has not had recent fever, chills or night sweats.  He has noticed some modest fatigue but otherwise feels that he tolerated treatment very well and is without complaints.  Plan: The patient has completed radiation treatment. The patient will return to radiation oncology clinic for routine followup in one month. I advised them to call or return sooner if they have any questions or concerns related to their recovery or treatment. ________________________________  Sheral Apley. Tammi Klippel, M.D.

## 2019-05-30 ENCOUNTER — Other Ambulatory Visit: Payer: Self-pay | Admitting: Cardiothoracic Surgery

## 2019-05-30 ENCOUNTER — Other Ambulatory Visit: Payer: Self-pay

## 2019-05-30 DIAGNOSIS — C3490 Malignant neoplasm of unspecified part of unspecified bronchus or lung: Secondary | ICD-10-CM

## 2019-05-30 NOTE — Progress Notes (Signed)
cxr 

## 2019-05-31 ENCOUNTER — Ambulatory Visit
Admission: RE | Admit: 2019-05-31 | Discharge: 2019-05-31 | Disposition: A | Discharge status: Home / Self Care | Payer: Medicare Other | Source: Ambulatory Visit | Attending: Cardiothoracic Surgery | Admitting: Cardiothoracic Surgery

## 2019-05-31 ENCOUNTER — Encounter: Payer: Self-pay | Admitting: Cardiothoracic Surgery

## 2019-05-31 ENCOUNTER — Ambulatory Visit (INDEPENDENT_AMBULATORY_CARE_PROVIDER_SITE_OTHER): Payer: Medicare Other | Admitting: Cardiothoracic Surgery

## 2019-05-31 VITALS — BP 125/81 | HR 70 | Temp 97.9°F | Resp 16 | Ht 65.0 in | Wt 120.0 lb

## 2019-05-31 DIAGNOSIS — C342 Malignant neoplasm of middle lobe, bronchus or lung: Secondary | ICD-10-CM | POA: Diagnosis not present

## 2019-05-31 DIAGNOSIS — Z923 Personal history of irradiation: Secondary | ICD-10-CM

## 2019-05-31 DIAGNOSIS — C3490 Malignant neoplasm of unspecified part of unspecified bronchus or lung: Secondary | ICD-10-CM

## 2019-05-31 DIAGNOSIS — R918 Other nonspecific abnormal finding of lung field: Secondary | ICD-10-CM | POA: Diagnosis not present

## 2019-05-31 NOTE — Progress Notes (Signed)
PCP is Cari Caraway, MD Referring Provider is Izora Gala, MD  Chief Complaint  Patient presents with  . Lung Lesion    6 week f/u with CXR, has completed radiation on 05/05/19 to [place]    HPI: Patient Patient returns for follow-up with chest x-ray about 1 month after empiric SBRT to a right middle lobe nodule which was too high risk for pretreatment biopsy.  The patient has advanced COPD and is status post left upper lobe resection of an adenocarcinoma several years ago.  He tolerated the SBRT well without any symptoms other than transient minimal weight loss.  He now feels fine.  Chest x-ray today is personally reviewed and shows resolution of the previously noted right middle lobe density.  The patient will need follow-up surveillance CT scanning which I will carry out if recommended by Dr. Mont Dutton oncology.   Past Medical History:  Diagnosis Date  . Chronic mastoiditis, bilateral   . COPD (chronic obstructive pulmonary disease) ([place])   . Hearing loss   . Hypertension   . Lung cancer ([place])   . Pneumothorax, left    History of post op  . Prostate cancer ([place])   . Shortness of breath dyspnea    on exertion  . Spinal stenosis     Past Surgical History:  Procedure Laterality Date  . BAHA REVISION Right 08/30/2014   Procedure: BONE ANCHORED HEARING AID (BAHA) REVISION RIGHT SIDE;  Surgeon: Izora Gala, MD;  Location: [place];  Service: ENT;  Laterality: Right;  . BONE ANCHORED HEARING AID IMPLANT  08/03/2012   Procedure: BONE ANCHORED HEARING AID (BAHA) IMPLANT;  Surgeon: Izora Gala, MD;  Location: [place];  Service: ENT;  Laterality: Right;  . COLONOSCOPY  2007  . CYSTOSCOPY  03/25/2018   Procedure: CYSTOSCOPY;  Surgeon: Kathie Rhodes, MD;  Location: [facility];  Service: Urology;;  no seeds noted in bladder  . LEG SURGERY     right leg deep cut  . MOUTH SURGERY    . PROSTATE BIOPSY    . RADIOACTIVE SEED IMPLANT N/A 03/25/2018   Procedure:  RADIOACTIVE SEED IMPLANT/BRACHYTHERAPY IMPLANT;  Surgeon: Kathie Rhodes, MD;  Location: [facility];  Service: Urology;  Laterality: N/A;  71 seeds implanted  . SPACE OAR INSTILLATION N/A 03/25/2018   Procedure: SPACE OAR INSTILLATION;  Surgeon: Kathie Rhodes, MD;  Location: [facility];  Service: Urology;  Laterality: N/A;  . STAPEDES SURGERY     bilateral  . VIDEO ASSISTED THORACOSCOPY (VATS)/ LOBECTOMY Left 10/09/2014   Procedure: VIDEO ASSISTED THORACOSCOPY (VATS)/ LOBECTOMY;  Surgeon: Ivin Poot, MD;  Location: [facility] OR;  Service: Thoracic;  Laterality: Left;    Family History  Problem Relation Age of Onset  . Cancer Neg Hx     Social History Social History   Tobacco Use  . Smoking status: Former Smoker    Packs/day: 1.00    Years: 35.00    Pack years: 35.00    Types: Cigarettes    Quit date: 05/11/2014    Years since quitting: 5.0  . Smokeless tobacco: Never Used  Substance Use Topics  . Alcohol use: No  . Drug use: No    Current Outpatient Medications  Medication Sig Dispense Refill  . budesonide-formoterol (SYMBICORT) 160-4.5 MCG/ACT inhaler Inhale 2 puffs into the lungs 2 (two) times daily as needed (wheezing). 1 Inhaler 4  . hydrochlorothiazide (MICROZIDE) 12.5 MG capsule TK 1 C PO QD IN THE MORNING  1  .  tamsulosin (FLOMAX) 0.4 MG CAPS capsule Take 0.4 mg by mouth.     No current facility-administered medications for this visit.     No Known Allergies  Review of Systems  Patient has regained his weight.  Feels stronger and has good appetite No shortness of breath fever or productive cough No symptoms of COVID-19 illness.  No exposure.  BP 125/81 (BP Location: Right Arm, Patient Position: Sitting, Cuff Size: Normal)   Pulse 70   Temp 97.9 F (36.6 C)   Resp 16   Ht 5\' 5"  (1.651 m)   Wt 120 lb (54.4 kg)   SpO2 92% Comment: RA  BMI 19.97 kg/m  Physical Exam      Exam    General- alert and comfortable    Neck- no JVD, no  cervical adenopathy palpable, no carotid bruit   Lungs- clear without rales, wheezes   Cor- regular rate and rhythm, no murmur , gallop   Abdomen- soft, non-tender   Extremities - warm, non-tender, minimal edema   Neuro- oriented, appropriate, no focal weakness   Diagnostic Tests: Chest x-ray personally reviewed showing resolution of previously noted right middle lobe nodule density  Impression: Doing well after receiving empiric SBRT for a right middle lobe nodule slowly increasing over time and history of previous adenocarcinoma of the lung. Trans-needle biopsy deemed too high risk because of location Plan: Return for follow-up in 3 months with chest x-ray.  We will arrange CT scan of chest as needed.   [name], MD [facility] [phone]

## 2019-06-15 ENCOUNTER — Other Ambulatory Visit: Payer: Self-pay

## 2019-06-15 ENCOUNTER — Ambulatory Visit
Admission: RE | Admit: 2019-06-15 | Discharge: 2019-06-15 | Disposition: A | Discharge status: Home / Self Care | Payer: Medicare Other | Source: Ambulatory Visit | Attending: Urology | Admitting: Urology

## 2019-06-15 DIAGNOSIS — C342 Malignant neoplasm of middle lobe, bronchus or lung: Secondary | ICD-10-CM

## 2019-06-15 NOTE — Progress Notes (Signed)
Radiation Oncology         ([phone]) [phone] ________________________________  Name: Jwan Hornbaker MRN: [id number]  Date: 06/15/2019  DOB: 28-Nov-1955  Post Treatment Note  CC: Cari Caraway, MD  Kathie Rhodes, MD  Diagnosis:   63 y.o.male withputative [place] in the right middle lobe lung.     Interval Since Last Radiation:  6 weeks  SBRT 04/28/19, 05/02/19, 05/05/19:  The RML target was treated to 54 Gy in 3 fractions of 18 Gy  Narrative:  I spoke with the patient to conduct his/her routine scheduled 1 month follow up visit via telephone to spare the patient unnecessary potential exposure in the healthcare setting during the current COVID-19 pandemic.  The patient was notified in advance and gave permission to proceed with this visit format.   He tolerated radiation treatment relatively well.   He denied any ill side effects associated with his treatment, specifically denied dysphagia, increased productive cough, chest pain or hemoptysis.  He denied recent fever, chills or night sweats.  He noticed some modest fatigue but otherwise felt that he tolerated treatment very well..                             On review of systems, the patient states that he is doing very well overall.  He continues with mild fatigue but denies any increased shortness of breath, productive cough, chest pain, fever, chills or night sweats.  He denies any dysphasia and reports a healthy appetite, maintaining his weight.  He had a recent follow-up visit with Dr. Prescott Gum on 05/31/2019 with a chest x-ray at that time showing resolution of the recently treated right middle lobe lesion.  He is currently without complaints and overall, he is quite pleased with his progress to date.  ALLERGIES:  has No Known Allergies.  Meds: Current Outpatient Medications  Medication Sig Dispense Refill  . budesonide-formoterol (SYMBICORT) 160-4.5 MCG/ACT inhaler Inhale 2 puffs into the lungs 2 (two) times daily as needed (wheezing). 1  Inhaler 4  . hydrochlorothiazide (MICROZIDE) 12.5 MG capsule TK 1 C PO QD IN THE MORNING  1  . tamsulosin (FLOMAX) 0.4 MG CAPS capsule Take 0.4 mg by mouth.     No current facility-administered medications for this encounter.     Physical Findings:  vitals were not taken for this visit.   /Unable to assess due to telephone follow-up visit format.  Lab Findings: Lab Results  Component Value Date   WBC 6.2 03/18/2018   HGB 14.8 03/18/2018   HCT 44.4 03/18/2018   MCV 92.7 03/18/2018   PLT 284 03/18/2018     Radiographic Findings: Dg Chest 2 View  Result Date: 05/31/2019 CLINICAL DATA:  History of lung cancer.  Follow-up. EXAM: CHEST - 2 VIEW COMPARISON:  Chest x-ray January 16, 2015.  CT scan November 30, 2018 FINDINGS: The heart, hila, and mediastinum are stable. Postsurgical changes are seen in the left perihilar region. Emphysematous changes are seen. The nodularity seen in the right lung on the recent CT scan are not visualized on this chest x-ray with certainty. No acute infiltrate, nodule, or mass identified. Anterior wedging of a few thoracic vertebral bodies is stable. IMPRESSION: 1. COPD/emphysema. 2. Postsurgical changes on the left. 3. The nodularity seen in the right lung on the comparison CT scan is not appreciated on this chest x-ray. CT imaging would be more sensitive. Electronically Signed   By: Dorise Bullion III M.D  On: 05/31/2019 11:04    Impression/Plan: 1. 63 y.o.male withputative [place] in the right middle lobe lung.    He appears to have recovered well from his recent radiotherapy and is currently without complaints.  We discussed that while we are happy to continue to participate in his care if clinically indicated, at this point, we will plan to see him back on an as-needed basis.  He will continue in routine follow-up under the care and direction of Dr. Darcey Nora who is planning to repeat a CT chest in approximately 2-3 months.  Based on the current NCCN  guidelines, we would recommend continued close follow-up with H&P and serial CT chest scans every 3 to 6 months for the first 3 years, then extending out to 6 month follow up with CT Chest imaging for 2 years followed by annual visits with low-dose screening CT Chest thereafter.  He appears to have a good understanding of his disease and our recommendations and is in agreement with the stated plan.  He knows to call at anytime with any questions or concerns related to his previous radiotherapy.    Nicholos Johns, PA-C

## 2019-09-19 ENCOUNTER — Other Ambulatory Visit: Payer: Self-pay | Admitting: Cardiothoracic Surgery

## 2019-09-19 DIAGNOSIS — C342 Malignant neoplasm of middle lobe, bronchus or lung: Secondary | ICD-10-CM

## 2019-09-20 ENCOUNTER — Encounter: Payer: Self-pay | Admitting: Cardiothoracic Surgery

## 2019-09-20 ENCOUNTER — Other Ambulatory Visit: Payer: Self-pay

## 2019-09-20 ENCOUNTER — Ambulatory Visit (INDEPENDENT_AMBULATORY_CARE_PROVIDER_SITE_OTHER): Payer: Medicare Other | Admitting: Cardiothoracic Surgery

## 2019-09-20 ENCOUNTER — Ambulatory Visit
Admission: RE | Admit: 2019-09-20 | Discharge: 2019-09-20 | Disposition: A | Discharge status: Home / Self Care | Payer: Medicare Other | Source: Ambulatory Visit | Attending: Cardiothoracic Surgery | Admitting: Cardiothoracic Surgery

## 2019-09-20 DIAGNOSIS — Z923 Personal history of irradiation: Secondary | ICD-10-CM | POA: Diagnosis not present

## 2019-09-20 DIAGNOSIS — Z85118 Personal history of other malignant neoplasm of bronchus and lung: Secondary | ICD-10-CM | POA: Diagnosis not present

## 2019-09-20 DIAGNOSIS — C342 Malignant neoplasm of middle lobe, bronchus or lung: Secondary | ICD-10-CM

## 2019-09-20 NOTE — Progress Notes (Signed)
PCP is Cari Caraway, MD Referring Provider is Izora Gala, MD  Chief Complaint  Patient presents with  . Lung Lesion    F/U lung nodule w/ CXR    HPI: Patient returns 4 months  [July 2020 ]after SBRT empiric therapy of a hypermetabolic right middle lobe nodule with previous history of left upper lobectomy for stage I adenocarcinoma of the lung in 2015.  Patient has done well symptomatically and chest x-ray performed today shows no evidence of recurrence, lungs clear bilaterally.  Past Medical History:  Diagnosis Date  . Chronic mastoiditis, bilateral   . COPD (chronic obstructive pulmonary disease) ([place])   . Hearing loss   . Hypertension   . Lung cancer ([place])   . Pneumothorax, left    History of post op  . Prostate cancer ([place])   . Shortness of breath dyspnea    on exertion  . Spinal stenosis     Past Surgical History:  Procedure Laterality Date  . BAHA REVISION Right 08/30/2014   Procedure: BONE ANCHORED HEARING AID (BAHA) REVISION RIGHT SIDE;  Surgeon: Izora Gala, MD;  Location: [place];  Service: ENT;  Laterality: Right;  . BONE ANCHORED HEARING AID IMPLANT  08/03/2012   Procedure: BONE ANCHORED HEARING AID (BAHA) IMPLANT;  Surgeon: Izora Gala, MD;  Location: [place];  Service: ENT;  Laterality: Right;  . COLONOSCOPY  2007  . CYSTOSCOPY  03/25/2018   Procedure: CYSTOSCOPY;  Surgeon: Kathie Rhodes, MD;  Location: [facility];  Service: Urology;;  no seeds noted in bladder  . LEG SURGERY     right leg deep cut  . MOUTH SURGERY    . PROSTATE BIOPSY    . RADIOACTIVE SEED IMPLANT N/A 03/25/2018   Procedure: RADIOACTIVE SEED IMPLANT/BRACHYTHERAPY IMPLANT;  Surgeon: Kathie Rhodes, MD;  Location: [facility];  Service: Urology;  Laterality: N/A;  71 seeds implanted  . SPACE OAR INSTILLATION N/A 03/25/2018   Procedure: SPACE OAR INSTILLATION;  Surgeon: Kathie Rhodes, MD;  Location: [facility];  Service: Urology;  Laterality: N/A;  .  STAPEDES SURGERY     bilateral  . VIDEO ASSISTED THORACOSCOPY (VATS)/ LOBECTOMY Left 10/09/2014   Procedure: VIDEO ASSISTED THORACOSCOPY (VATS)/ LOBECTOMY;  Surgeon: Ivin Poot, MD;  Location: [facility] OR;  Service: Thoracic;  Laterality: Left;    Family History  Problem Relation Age of Onset  . Cancer Neg Hx     Social History Social History   Tobacco Use  . Smoking status: Former Smoker    Packs/day: 1.00    Years: 35.00    Pack years: 35.00    Types: Cigarettes    Quit date: 05/11/2014    Years since quitting: 5.3  . Smokeless tobacco: Never Used  Substance Use Topics  . Alcohol use: No  . Drug use: No    Current Outpatient Medications  Medication Sig Dispense Refill  . budesonide-formoterol (SYMBICORT) 160-4.5 MCG/ACT inhaler Inhale 2 puffs into the lungs 2 (two) times daily as needed (wheezing). 1 Inhaler 4  . hydrochlorothiazide (MICROZIDE) 12.5 MG capsule TK 1 C PO QD IN THE MORNING  1  . tamsulosin (FLOMAX) 0.4 MG CAPS capsule Take 0.4 mg by mouth.     No current facility-administered medications for this visit.     No Known Allergies  Review of Systems  No cough No chest pain No shortness of breath Weight stable No difficulty swallowing No ankle edema  BP 106/73 (BP Location: Left Arm)   Pulse  61   Temp 97.7 F (36.5 C) (Skin)   Resp 20   Ht 5\' 5"  (1.651 m)   Wt 124 lb 12.8 oz (56.6 kg)   SpO2 90% Comment: RA  BMI 20.77 kg/m  Physical Exam      Exam    General- alert and comfortable    Neck- no JVD, no cervical adenopathy palpable, no carotid bruit   Lungs- clear without rales, wheezes   Cor- regular rate and rhythm, no murmur , gallop   Abdomen- soft, non-tender   Extremities - warm, non-tender, minimal edema   Neuro- oriented, appropriate, no focal weakness   Diagnostic Tests: Chest x-ray today personally reviewed showing evidence of COPD but no at risk pulmonary nodules or masses.  Impression: Status post SBRT to hypermetabolic right  middle lobe nodule July 2020.  Patient will return 6 months following therapy with CT scan, January 2021.  Plan: CT chest with contrast January 2021 for surveillance scan following radiation therapy right middle lobe nodule   [name], MD [facility] [phone]

## 2019-10-26 ENCOUNTER — Other Ambulatory Visit: Payer: Self-pay | Admitting: Cardiothoracic Surgery

## 2019-10-26 DIAGNOSIS — R911 Solitary pulmonary nodule: Secondary | ICD-10-CM

## 2019-11-23 ENCOUNTER — Other Ambulatory Visit: Payer: Self-pay | Admitting: Cardiothoracic Surgery

## 2019-11-23 DIAGNOSIS — R911 Solitary pulmonary nodule: Secondary | ICD-10-CM

## 2019-11-24 DIAGNOSIS — R911 Solitary pulmonary nodule: Secondary | ICD-10-CM | POA: Diagnosis not present

## 2019-11-25 LAB — BUN: BUN: 11 mg/dL (ref 7–25)

## 2019-11-25 LAB — CREATININE, SERUM: Creat: 0.71 mg/dL (ref 0.70–1.25)

## 2019-11-29 ENCOUNTER — Telehealth: Payer: Self-pay | Admitting: Cardiothoracic Surgery

## 2019-11-29 ENCOUNTER — Telehealth: Payer: Medicare Other | Admitting: Cardiothoracic Surgery

## 2019-11-29 ENCOUNTER — Other Ambulatory Visit: Payer: Self-pay

## 2019-11-29 ENCOUNTER — Ambulatory Visit
Admission: RE | Admit: 2019-11-29 | Discharge: 2019-11-29 | Disposition: A | Discharge status: Home / Self Care | Payer: Medicare Other | Source: Ambulatory Visit | Attending: Cardiothoracic Surgery | Admitting: Cardiothoracic Surgery

## 2019-11-29 DIAGNOSIS — R911 Solitary pulmonary nodule: Secondary | ICD-10-CM

## 2019-11-29 DIAGNOSIS — J439 Emphysema, unspecified: Secondary | ICD-10-CM | POA: Diagnosis not present

## 2019-11-29 MED ORDER — IOPAMIDOL (ISOVUE-300) INJECTION 61%
75.0000 mL | Freq: Once | INTRAVENOUS | Status: AC | PRN
  Administered 2019-11-29: 75 mL via INTRAVENOUS

## 2019-11-29 NOTE — Telephone Encounter (Signed)
      [address]       ,[place] [address]             [phone]     CARDIOTHORACIC SURGERY TELEPHONE VIRTUAL OFFICE NOTE  Referring Provider is No ref. provider found Primary Cardiologist is No primary care provider on file. PCP is Cari Caraway, MD   HPI:  I spoke with Kyle Hamilton. (DOB 1956/02/07 ) via telephone on 11/29/2019 at 11:42 AM and verified that I was speaking with the correct person using more than one form of identification.  We discussed the reason(s) for conducting our visit virtually instead of in-person.  The patient expressed understanding the circumstances and agreed to proceed as described.  Patient had a CT scan of the chest with contrast today 6 months after receiving empiric stereotactic radiation therapy to a 10 mm right middle lobe hypermetabolic nodule on the margin of a pneumatocele.  I reviewed the images personally.  There has been a significant decrease in the size of the hypermetabolic nodule.  No other new at risk nodules are noted.  Patient still has postoperative changes following a left upper lobectomy from 2015.  I will have patient return for follow-up CT scan of the chest with contrast 1 year after his SBRT treatment-July 2021. I left a patient voicemail with report of the CT scan and the plan for follow-up CT scan in 6 months - 1 year after radiation therapy.  Current Outpatient Medications  Medication Sig Dispense Refill  . budesonide-formoterol (SYMBICORT) 160-4.5 MCG/ACT inhaler Inhale 2 puffs into the lungs 2 (two) times daily as needed (wheezing). 1 Inhaler 4  . hydrochlorothiazide (MICROZIDE) 12.5 MG capsule TK 1 C PO QD IN THE MORNING  1  . tamsulosin (FLOMAX) 0.4 MG CAPS capsule Take 0.4 mg by mouth.     No current facility-administered medications for this visit.     Diagnostic Tests:  Images of CT scan of the chest November 29, 2019 personally reviewed and results related to the  patient.   Impression:  Excellent response to SBRT therapy of a hypermetabolic right middle lobe nodule.  No new at risk nodules or adenopathy  Plan:  Return with CT scan of the chest in 6 months which we arranged with the patient.    I discussed limitations of evaluation and management via telephone.  The patient was advised to call back for repeat telephone consultation or to seek an in-person evaluation if questions arise or the patient's clinical condition changes in any significant manner.   no minutes of non-face-to-face time during the conduct of this telephone virtual office consultation.     11/29/2019 11:42 AM

## 2020-01-17 ENCOUNTER — Other Ambulatory Visit: Payer: Self-pay | Admitting: Cardiothoracic Surgery

## 2020-01-17 DIAGNOSIS — J449 Chronic obstructive pulmonary disease, unspecified: Secondary | ICD-10-CM

## 2020-04-22 ENCOUNTER — Other Ambulatory Visit: Payer: Self-pay | Admitting: *Deleted

## 2020-04-22 DIAGNOSIS — R911 Solitary pulmonary nodule: Secondary | ICD-10-CM

## 2020-05-08 ENCOUNTER — Other Ambulatory Visit: Payer: Self-pay | Admitting: Cardiothoracic Surgery

## 2020-05-08 DIAGNOSIS — J449 Chronic obstructive pulmonary disease, unspecified: Secondary | ICD-10-CM

## 2020-05-14 ENCOUNTER — Other Ambulatory Visit: Payer: Self-pay | Admitting: Cardiothoracic Surgery

## 2020-05-14 DIAGNOSIS — J449 Chronic obstructive pulmonary disease, unspecified: Secondary | ICD-10-CM

## 2020-05-29 ENCOUNTER — Telehealth: Payer: Self-pay | Admitting: Cardiothoracic Surgery

## 2020-05-29 ENCOUNTER — Other Ambulatory Visit: Payer: Self-pay

## 2020-05-29 ENCOUNTER — Ambulatory Visit
Admission: RE | Admit: 2020-05-29 | Discharge: 2020-05-29 | Disposition: A | Discharge status: Home / Self Care | Payer: Medicare Other | Source: Ambulatory Visit | Attending: Cardiothoracic Surgery | Admitting: Cardiothoracic Surgery

## 2020-05-29 ENCOUNTER — Telehealth: Payer: Medicare Other | Admitting: Cardiothoracic Surgery

## 2020-05-29 DIAGNOSIS — C3490 Malignant neoplasm of unspecified part of unspecified bronchus or lung: Secondary | ICD-10-CM | POA: Diagnosis not present

## 2020-05-29 DIAGNOSIS — R911 Solitary pulmonary nodule: Secondary | ICD-10-CM

## 2020-05-29 MED ORDER — IOPAMIDOL (ISOVUE-300) INJECTION 61%
75.0000 mL | Freq: Once | INTRAVENOUS | Status: AC | PRN
  Administered 2020-05-29: 75 mL via INTRAVENOUS

## 2020-05-29 NOTE — Telephone Encounter (Signed)
      [address]       [place] [address]             [phone]     CARDIOTHORACIC SURGERY TELEPHONE VIRTUAL OFFICE NOTE  Referring Provider is No ref. provider found Primary Cardiologist is No primary care provider on file. PCP is Cari Caraway, MD   HPI:  I spoke with Kyle Hamilton. (DOB 10/04/1956 ) via telephone on 05/29/2020 at 6:00 PM and verified that I was speaking with the correct person using more than one form of identification.  We discussed the reason(s) for conducting our visit virtually instead of in-person.  The patient expressed understanding the circumstances and agreed to proceed as described.  I called Mr. Kyle Hamilton today regarding the chest CT scan with contrast the patient had today.  I personally reviewed the images and the report.  The patient has received SBRT 1 year ago to a right upper lobe nodule.  It has shown no evidence of increase in size since the past scan 6 months ago.  The original pretreatment nodule measured 11 mm.  The current nodule is stable at 5 mm.  No other new areas of concern are present.  Postoperative changes from surgical resection of a left upper lobe nodule are stable.   Current Outpatient Medications  Medication Sig Dispense Refill  . budesonide-formoterol (SYMBICORT) 160-4.5 MCG/ACT inhaler INHALE 2 PUFFS INTO THE LUNGS TWICE DAILY AS NEEDED FOR WHEEZING 10.2 g 3  . hydrochlorothiazide (MICROZIDE) 12.5 MG capsule TK 1 C PO QD IN THE MORNING  1  . tamsulosin (FLOMAX) 0.4 MG CAPS capsule Take 0.4 mg by mouth.     No current facility-administered medications for this visit.     Diagnostic Tests:  CT of chest images personally reviewed today showing no evidence of recurrent or new lung malignancy.  Impression: Doing very well after SBRT to a right upper lobe nodule-empiric treatment.  Plan:  Continue non-smoking Repeat surveillance scan in 6 months.    I discussed limitations of evaluation and  management via telephone.  The patient was advised to call back for repeat telephone consultation or to seek an in-person evaluation if questions arise or the patient's clinical condition changes in any significant manner.  I spent in excess of 5 minutes of non-face-to-face time during the conduct of this telephone virtual office consultation.  Level 1  (99441)             5-10 minutes Level 2  (99442)            11-20 minutes Level 3  (99443)            21-30 minutes   05/29/2020 6:00 PM

## 2020-08-22 DIAGNOSIS — I10 Essential (primary) hypertension: Secondary | ICD-10-CM | POA: Diagnosis not present

## 2020-10-21 ENCOUNTER — Other Ambulatory Visit: Payer: Self-pay | Admitting: Cardiothoracic Surgery

## 2020-10-21 DIAGNOSIS — C3490 Malignant neoplasm of unspecified part of unspecified bronchus or lung: Secondary | ICD-10-CM

## 2020-11-13 ENCOUNTER — Ambulatory Visit: Payer: Medicare Other | Admitting: Cardiothoracic Surgery

## 2020-11-13 ENCOUNTER — Inpatient Hospital Stay: Admission: RE | Admit: 2020-11-13 | Payer: Medicare Other | Source: Ambulatory Visit

## 2020-11-18 ENCOUNTER — Ambulatory Visit
Admission: RE | Admit: 2020-11-18 | Discharge: 2020-11-18 | Disposition: A | Discharge status: Home / Self Care | Payer: Medicare Other | Source: Ambulatory Visit | Attending: Cardiothoracic Surgery | Admitting: Cardiothoracic Surgery

## 2020-11-18 DIAGNOSIS — C3490 Malignant neoplasm of unspecified part of unspecified bronchus or lung: Secondary | ICD-10-CM | POA: Diagnosis not present

## 2020-11-18 MED ORDER — IOPAMIDOL (ISOVUE-300) INJECTION 61%
75.0000 mL | Freq: Once | INTRAVENOUS | Status: AC | PRN
  Administered 2020-11-18: 75 mL via INTRAVENOUS

## 2020-11-20 ENCOUNTER — Ambulatory Visit (INDEPENDENT_AMBULATORY_CARE_PROVIDER_SITE_OTHER): Payer: Medicare Other | Admitting: Cardiothoracic Surgery

## 2020-11-20 ENCOUNTER — Encounter: Payer: Self-pay | Admitting: Cardiothoracic Surgery

## 2020-11-20 VITALS — BP 126/80 | HR 79 | Resp 20 | Ht 65.0 in | Wt 116.0 lb

## 2020-11-20 DIAGNOSIS — J984 Other disorders of lung: Secondary | ICD-10-CM | POA: Diagnosis not present

## 2020-11-20 DIAGNOSIS — R911 Solitary pulmonary nodule: Secondary | ICD-10-CM

## 2020-11-20 DIAGNOSIS — Z85118 Personal history of other malignant neoplasm of bronchus and lung: Secondary | ICD-10-CM | POA: Diagnosis not present

## 2020-11-20 NOTE — Progress Notes (Signed)
PCP is Cari Caraway, MD Referring Provider is Izora Gala, MD  Chief Complaint  Patient presents with  . Lung Lesion    1 year f/u with Chest Ct 11/18/20    HPI: Patient returns for follow-up with CT scan of chest with prior history of severe COPD,, VATS with resection of left upper lobe stage I adenocarcinoma 2015, empiric SBRT therapy of a right middle lobe nodule located at the margin of a pneumatocele with positive PET activity June 2020.  His last CT scan showed the right middle lobe nodule to remain small without concern for recurrence.  Reviewed his most recent CT chest November 18, 2020..  In the past 6 months, the right middle lobe nodule remains at 4 mm following the SBRT therapy and shows no indication of recurrence.  The patient will return for a follow-up CT chest in 6 months.  My partner Dr. Kipp Brood will assume care of this very nice patient following my retirement.  Past Medical History:  Diagnosis Date  . Chronic mastoiditis, bilateral   . COPD (chronic obstructive pulmonary disease) ([place])   . Hearing loss   . Hypertension   . Lung cancer ([place])   . Pneumothorax, left    History of post op  . Prostate cancer ([place])   . Shortness of breath dyspnea    on exertion  . Spinal stenosis     Past Surgical History:  Procedure Laterality Date  . BAHA REVISION Right 08/30/2014   Procedure: BONE ANCHORED HEARING AID (BAHA) REVISION RIGHT SIDE;  Surgeon: Izora Gala, MD;  Location: [place];  Service: ENT;  Laterality: Right;  . BONE ANCHORED HEARING AID IMPLANT  08/03/2012   Procedure: BONE ANCHORED HEARING AID (BAHA) IMPLANT;  Surgeon: Izora Gala, MD;  Location: [place];  Service: ENT;  Laterality: Right;  . COLONOSCOPY  2007  . CYSTOSCOPY  03/25/2018   Procedure: CYSTOSCOPY;  Surgeon: Kathie Rhodes, MD;  Location: [facility];  Service: Urology;;  no seeds noted in bladder  . LEG SURGERY     right leg deep cut  . MOUTH SURGERY    . PROSTATE BIOPSY    .  RADIOACTIVE SEED IMPLANT N/A 03/25/2018   Procedure: RADIOACTIVE SEED IMPLANT/BRACHYTHERAPY IMPLANT;  Surgeon: Kathie Rhodes, MD;  Location: [facility];  Service: Urology;  Laterality: N/A;  71 seeds implanted  . SPACE OAR INSTILLATION N/A 03/25/2018   Procedure: SPACE OAR INSTILLATION;  Surgeon: Kathie Rhodes, MD;  Location: [facility];  Service: Urology;  Laterality: N/A;  . STAPEDES SURGERY     bilateral  . VIDEO ASSISTED THORACOSCOPY (VATS)/ LOBECTOMY Left 10/09/2014   Procedure: VIDEO ASSISTED THORACOSCOPY (VATS)/ LOBECTOMY;  Surgeon: Ivin Poot, MD;  Location: [facility] OR;  Service: Thoracic;  Laterality: Left;    Family History  Problem Relation Age of Onset  . Cancer Neg Hx     Social History Social History   Tobacco Use  . Smoking status: Former Smoker    Packs/day: 1.00    Years: 35.00    Pack years: 35.00    Types: Cigarettes    Quit date: 05/11/2014    Years since quitting: 6.5  . Smokeless tobacco: Never Used  Vaping Use  . Vaping Use: Never used  Substance Use Topics  . Alcohol use: No  . Drug use: No    Current Outpatient Medications  Medication Sig Dispense Refill  . budesonide-formoterol (SYMBICORT) 160-4.5 MCG/ACT inhaler INHALE 2 PUFFS INTO THE LUNGS TWICE DAILY  AS NEEDED FOR WHEEZING 10.2 g 3  . hydrochlorothiazide (MICROZIDE) 12.5 MG capsule TK 1 C PO QD IN THE MORNING  1  . tamsulosin (FLOMAX) 0.4 MG CAPS capsule Take 0.4 mg by mouth.     No current facility-administered medications for this visit.    No Known Allergies  Review of Systems   Recently recovered from influenza with cough and fever Mild weight loss COVID test negative No chest pain night sweats or edema Night with blood pressure medication Non-smoker No dizziness or presyncope  Ht 5\' 5"  (1.651 m)   BMI 20.77 kg/m  Physical Exam      Exam    General- alert and comfortable    Neck- no JVD, no cervical adenopathy palpable, no carotid bruit    Lungs-distant breath sounds with scattered rhonchi without wheezes   Cor- regular rate and rhythm, no murmur , gallop   Abdomen- soft, non-tender   Extremities - warm, non-tender, minimal edema   Neuro- oriented, appropriate, no focal weakness   Diagnostic Tests: CT scan images November 18, 2020 personally reviewed showing no change in the small right middle lobe nodule at the margin of the pneumatocele.  No new at risk mediastinal nodes.  Impression: No evidence of recurrent pulmonary malignancy on surveillance CT scan of the chest 18 months after empiric SBRT of a 1.1 cm right middle lobe nodule at the margin of a pneumatocele.  Plan: Return in 6 months with surveillance CT scan   [name], MD [facility] [phone]

## 2021-04-16 ENCOUNTER — Other Ambulatory Visit: Payer: Self-pay | Admitting: Thoracic Surgery (Cardiothoracic Vascular Surgery)

## 2021-04-16 DIAGNOSIS — R911 Solitary pulmonary nodule: Secondary | ICD-10-CM

## 2021-05-23 ENCOUNTER — Ambulatory Visit
Admission: RE | Admit: 2021-05-23 | Discharge: 2021-05-23 | Disposition: A | Discharge status: Home / Self Care | Payer: Medicare Other | Source: Ambulatory Visit | Attending: Thoracic Surgery (Cardiothoracic Vascular Surgery) | Admitting: Thoracic Surgery (Cardiothoracic Vascular Surgery)

## 2021-05-23 ENCOUNTER — Other Ambulatory Visit: Payer: Self-pay

## 2021-05-23 ENCOUNTER — Ambulatory Visit (INDEPENDENT_AMBULATORY_CARE_PROVIDER_SITE_OTHER): Payer: Medicare Other | Admitting: Physician Assistant

## 2021-05-23 VITALS — BP 105/72 | HR 77 | Resp 20 | Ht 65.0 in | Wt 111.0 lb

## 2021-05-23 DIAGNOSIS — J449 Chronic obstructive pulmonary disease, unspecified: Secondary | ICD-10-CM | POA: Diagnosis not present

## 2021-05-23 DIAGNOSIS — J432 Centrilobular emphysema: Secondary | ICD-10-CM | POA: Diagnosis not present

## 2021-05-23 DIAGNOSIS — R911 Solitary pulmonary nodule: Secondary | ICD-10-CM

## 2021-05-23 DIAGNOSIS — C3492 Malignant neoplasm of unspecified part of left bronchus or lung: Secondary | ICD-10-CM | POA: Diagnosis not present

## 2021-05-23 DIAGNOSIS — Z85118 Personal history of other malignant neoplasm of bronchus and lung: Secondary | ICD-10-CM

## 2021-05-23 DIAGNOSIS — J929 Pleural plaque without asbestos: Secondary | ICD-10-CM | POA: Diagnosis not present

## 2021-05-23 DIAGNOSIS — I2510 Atherosclerotic heart disease of native coronary artery without angina pectoris: Secondary | ICD-10-CM | POA: Diagnosis not present

## 2021-05-23 MED ORDER — BUDESONIDE-FORMOTEROL FUMARATE 160-4.5 MCG/ACT IN AERO: INHALATION_SPRAY | RESPIRATORY_TRACT | 4 refills | Status: DC

## 2021-05-23 MED ORDER — IOPAMIDOL (ISOVUE-300) INJECTION 61%
75.0000 mL | Freq: Once | INTRAVENOUS | Status: AC | PRN
  Administered 2021-05-23: 75 mL via INTRAVENOUS

## 2021-05-23 NOTE — Patient Instructions (Addendum)
Awaiting radiology interpretation of today's CT of the chest, as have been following a RUL nodule. Once officially interpreted, will contact patient with results. If RUL is stable, will return in 12 months with CT of chest. Also, at patient request, will give refill on Symbicort.

## 2021-05-23 NOTE — Progress Notes (Addendum)
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 Prescott Gum. The patient has received SBRT 2 years ago to a right upper lobe nodule.  Patient was last seen on 11/18/2020 and the RUL nodule was 4 mm. The original pretreatment nodule measured 11 mm. The current size of this nodule is 4 mm (unchanged from previously). Because of Dr. Lucianne Lei Trigt's retirement, patient will now be followed by Dr. Kipp Brood. Patient presents today for further surveillance of RUL nodule.    Current Outpatient Medications  Medication Sig Dispense Refill   budesonide-formoterol (SYMBICORT) 160-4.5 MCG/ACT inhaler INHALE 2 PUFFS INTO THE LUNGS TWICE DAILY AS NEEDED FOR WHEEZING 10.2 g 3   hydrochlorothiazide (MICROZIDE) 12.5 MG capsule TK 1 C PO QD IN THE MORNING  1   Vitals:   05/23/21 1222  BP: 105/72  Pulse: 77  Resp: 20  SpO2: 94%    Physical Exam: CV-RRR Pulmonary-Clear to auscultation bilaterally Extremities-No LE edema Neuro-Grossly intact without focal deficit  Diagnostic Tests: Impression  CLINICAL DATA:  Left lung cancer, status post wedge resection, former smoker   EXAM: CT CHEST WITH CONTRAST   TECHNIQUE: Multidetector CT imaging of the chest was performed during intravenous contrast administration.   CONTRAST:  13mL ISOVUE-300 IOPAMIDOL (ISOVUE-300) INJECTION 61%   COMPARISON:  11/18/2020   FINDINGS: Cardiovascular: Aortic atherosclerosis. Normal heart size. Left coronary artery calcifications. No pericardial effusion.   Mediastinum/Nodes: No enlarged mediastinal, hilar, or axillary lymph nodes. Thyroid gland, trachea, and esophagus demonstrate no significant findings.   Lungs/Pleura: Mild centrilobular emphysema. Diffuse bilateral bronchial wall thickening. Unchanged  postoperative findings of wedge resection primarily involving the superior segment left lower lobe (series 8, image 64). Unchanged 4 mm nodule of the posterior right upper lobe adjacent to a small pneumatocele (series 8, image 94). No pleural effusion or pneumothorax.   Upper Abdomen: No acute abnormality. Unchanged, subcentimeter low-attenuation lesions scattered throughout the liver, likely small cysts and/or hemangiomata, incompletely characterized.   Musculoskeletal: No chest wall mass or suspicious bone lesions identified.   IMPRESSION: 1. Unchanged postoperative findings of wedge resection primarily involving the superior segment left lower lobe. No evidence of recurrent or metastatic disease in the chest. 2. Unchanged 4 mm nodule of the posterior right upper lobe adjacent to a small pneumatocele, likely benign sequelae of infection or inflammation. Attention on follow-up. 3. Emphysema and diffuse bilateral bronchial wall thickening. 4. Coronary artery disease.   Aortic Atherosclerosis (ICD10-I70.0) and Emphysema (ICD10-J43.9).     Electronically Signed   By: Eddie Candle M.D.   On: 05/23/2021 13:00      Impression and Plan: Overall, patient has done well with continued surveillance of RUL nodule. Patient denies chest pain or worsening shortness of breath. His appetite has been decreased lately because of the heat. I have offered to give him a referral to Dr. Arvilla Market office to continue to follow him with [place] and patient is agreeable. Patient requested a prescription refill for Symbicort, which I did. The result of the CT was unavailable at the time the patient was seen so I contacted him with the results. He will have a repeat CT scan of the chest and return to see Dr. Kipp Brood in 1 year.   Nani Skillern, PA-C [facility] [phone])  [phone]  

## 2021-05-26 ENCOUNTER — Encounter: Payer: Self-pay | Admitting: *Deleted

## 2021-05-26 DIAGNOSIS — C342 Malignant neoplasm of middle lobe, bronchus or lung: Secondary | ICD-10-CM

## 2021-05-26 NOTE — Progress Notes (Signed)
I received referral on Kyle Hamilton today.  I updated new patient coordinator to call and schedule him to be seen in a few weeks with Dr. Julien Nordmann.

## 2021-05-27 ENCOUNTER — Telehealth: Payer: Self-pay | Admitting: Internal Medicine

## 2021-05-27 NOTE — Telephone Encounter (Signed)
Received a new pt referral from Dr. Kipp Brood for new dx of lung cancer. Kyle Hamilton has been cld and scheduled to see Dr. Julien Nordmann on 8/8 at 2:15pm w/labs at 145pm. I cld and lft the appt date and time on the pt's vm. Letter mailed.

## 2021-06-16 ENCOUNTER — Other Ambulatory Visit: Payer: Self-pay

## 2021-06-16 ENCOUNTER — Inpatient Hospital Stay: Payer: Medicare Other

## 2021-06-16 ENCOUNTER — Inpatient Hospital Stay: Payer: Medicare Other | Attending: Internal Medicine | Admitting: Internal Medicine

## 2021-06-16 VITALS — BP 122/84 | HR 66 | Temp 97.9°F | Resp 19 | Ht 65.0 in | Wt 115.3 lb

## 2021-06-16 DIAGNOSIS — Z85118 Personal history of other malignant neoplasm of bronchus and lung: Secondary | ICD-10-CM | POA: Diagnosis not present

## 2021-06-16 DIAGNOSIS — C3490 Malignant neoplasm of unspecified part of unspecified bronchus or lung: Secondary | ICD-10-CM

## 2021-06-16 DIAGNOSIS — C342 Malignant neoplasm of middle lobe, bronchus or lung: Secondary | ICD-10-CM

## 2021-06-16 DIAGNOSIS — Z87891 Personal history of nicotine dependence: Secondary | ICD-10-CM | POA: Diagnosis not present

## 2021-06-16 DIAGNOSIS — Z8546 Personal history of malignant neoplasm of prostate: Secondary | ICD-10-CM | POA: Diagnosis not present

## 2021-06-16 DIAGNOSIS — C3432 Malignant neoplasm of lower lobe, left bronchus or lung: Secondary | ICD-10-CM | POA: Diagnosis not present

## 2021-06-16 LAB — CBC WITH DIFFERENTIAL (CANCER CENTER ONLY)
Abs Immature Granulocytes: 0.02 10*3/uL (ref 0.00–0.07)
Basophils Absolute: 0 10*3/uL (ref 0.0–0.1)
Basophils Relative: 1 %
Eosinophils Absolute: 0.1 10*3/uL (ref 0.0–0.5)
Eosinophils Relative: 1 %
HCT: 47 % (ref 39.0–52.0)
Hemoglobin: 15.6 g/dL (ref 13.0–17.0)
Immature Granulocytes: 0 %
Lymphocytes Relative: 34 %
Lymphs Abs: 1.9 10*3/uL (ref 0.7–4.0)
MCH: 29.6 pg (ref 26.0–34.0)
MCHC: 33.2 g/dL (ref 30.0–36.0)
MCV: 89.2 fL (ref 80.0–100.0)
Monocytes Absolute: 0.6 10*3/uL (ref 0.1–1.0)
Monocytes Relative: 10 %
Neutro Abs: 3.1 10*3/uL (ref 1.7–7.7)
Neutrophils Relative %: 54 %
Platelet Count: 277 10*3/uL (ref 150–400)
RBC: 5.27 MIL/uL (ref 4.22–5.81)
RDW: 13.3 % (ref 11.5–15.5)
WBC Count: 5.7 10*3/uL (ref 4.0–10.5)
nRBC: 0 % (ref 0.0–0.2)

## 2021-06-16 LAB — CMP (CANCER CENTER ONLY)
ALT: 17 U/L (ref 0–44)
AST: 18 U/L (ref 15–41)
Albumin: 4 g/dL (ref 3.5–5.0)
Alkaline Phosphatase: 76 U/L (ref 38–126)
Anion gap: 9 (ref 5–15)
BUN: 11 mg/dL (ref 8–23)
CO2: 29 mmol/L (ref 22–32)
Calcium: 9.8 mg/dL (ref 8.9–10.3)
Chloride: 103 mmol/L (ref 98–111)
Creatinine: 0.67 mg/dL (ref 0.61–1.24)
GFR, Estimated: >60 mL/min (ref >60)
Glucose, Bld: 83 mg/dL (ref 70–99)
Potassium: 3.8 mmol/L (ref 3.5–5.1)
Sodium: 141 mmol/L (ref 135–145)
Total Bilirubin: 0.7 mg/dL (ref 0.3–1.2)
Total Protein: 7.1 g/dL (ref 6.5–8.1)

## 2021-06-16 NOTE — Progress Notes (Signed)
[place] [phone]   [phone]  CONSULT NOTE  REFERRING PHYSICIAN: Dr. Melodie Bouillon  REASON FOR CONSULTATION:  65 years old white male with history of lung cancer.  HPI Kyle Hamilton. is a 65 y.o. male with past medical history significant for COPD, hypertension, history of prostate cancer, spinal stenosis as well as hypertension and hearing loss.  The patient mentioned that in 2015 he incidentally found to have pulmonary nodules on preoperative chest x-ray.  This was followed by CT scan of the chest without contrast on September 04, 2014 and it showed 1.25 cm superior segment left lower lobe pulmonary nodule worrisome for primary lung neoplasm.  A PET scan on September 26, 2014 showed malignant range FDG uptake associated with the left lower lobe pulmonary nodule with no evidence of mediastinal lymphadenopathy involvement or distant metastatic disease.  On October 09, 2014 the patient underwent left VATS with left mini thoracotomy and wedge resection of the superior segment of the left lower lobe with mediastinal lymph node dissection.  The final pathology ([id number]) showed invasive adenocarcinoma moderately differentiated spanning 1.8 cm with lymphovascular invasion identified as well as perineural invasion.  The dissected lymph nodes were negative for malignancy.  The patient had molecular studies by foundation 1 and that showed positive EGFR mutation ([id number]).  He was followed by observation and repeat imaging studies.  On Mar 23, 2019 his imaging studies including a PET scan showed hypermetabolic right middle lobe nodule consistent with bronchogenic carcinoma with no evidence of mediastinal adenopathy.  The patient underwent SBRT to this nodule under the care of Dr. Tammi Klippel. He was again followed by observation and repeat imaging studies with improvement of the right middle lobe pulmonary nodule and no new finding of progressive disease. After the  retirement of Dr. Darcey Nora the patient is followed by Dr. Kipp Brood and he kindly referred him to me today for evaluation and recommendation regarding his condition. When seen today the patient is feeling fine with no concerning complaints except for shortness of breath with exertion and heavy lifting.  He denied having any chest pain, cough or hemoptysis.  He denied having any nausea, vomiting, diarrhea or constipation.  He has no headache or visual changes.  He denied having any recent weight loss or night sweats. Family history significant for mother with dementia and father had heart disease. The patient is divorced and has 3 children 2 sons and 1 daughter.  He used to do custom homes.  He has a history of smoking for around 40 years but quit in July 2015.  The patient has no history of alcohol or drug abuse. HPI  Past Medical History:  Diagnosis Date   Chronic mastoiditis, bilateral    COPD (chronic obstructive pulmonary disease) (HCC)    Hearing loss    Hypertension    Lung cancer ([place])    Pneumothorax, left    History of post op   Prostate cancer ([place])    Shortness of breath dyspnea    on exertion   Spinal stenosis     Past Surgical History:  Procedure Laterality Date   BAHA REVISION Right 08/30/2014   Procedure: BONE ANCHORED HEARING AID ([place]) REVISION RIGHT SIDE;  Surgeon: Izora Gala, MD;  Location: [place];  Service: ENT;  Laterality: Right;   BONE ANCHORED HEARING AID IMPLANT  08/03/2012   Procedure: BONE ANCHORED HEARING [place] (BAHA) IMPLANT;  Surgeon: Izora Gala, MD;  Location: [place];  Service: ENT;  Laterality:  Right;   COLONOSCOPY  2007   CYSTOSCOPY  03/25/2018   Procedure: CYSTOSCOPY;  Surgeon: Kathie Rhodes, MD;  Location[facility];  Service: Urology;;  no seeds noted in bladder   LEG SURGERY     right leg deep cut   [name] 03/25/2018   Procedure: RADIOACTIVE SEED IMPLANT/BRACHYTHERAPY  IMPLANT;  Surgeon: Kathie Rhodes, MD;  Location: [place];  Service: Urology;  Laterality: N/A;  71 seeds implanted   SPACE OAR INSTILLATION N/A 03/25/2018   Procedure: SPACE OAR INSTILLATION;  Surgeon: Kathie Rhodes, MD;  Location: [facility];  Service: Urology;  Laterality: N/A;   STAPEDES SURGERY     bilateral   VIDEO ASSISTED THORACOSCOPY (VATS)/ LOBECTOMY Left 10/09/2014   Procedure: VIDEO ASSISTED THORACOSCOPY (VATS)/ LOBECTOMY;  Surgeon: Ivin Poot, MD;  Location: [facility];  Service: Thoracic;  Laterality: Left;    Family History  Problem Relation Age of Onset   Cancer Neg Hx     Social History Social History   Tobacco Use   Smoking status: Former    Packs/day: 1.00    Years: 35.00    Pack years: 35.00    Types: Cigarettes    Quit date: 05/11/2014    Years since quitting: 7.1   Smokeless tobacco: Never  Vaping Use   Vaping Use: Never used  Substance Use Topics   Alcohol use: No   Drug use: No    No Known Allergies  Current Outpatient Medications  Medication Sig Dispense Refill   budesonide-formoterol (SYMBICORT) 160-4.5 MCG/ACT inhaler INHALE 2 PUFFS INTO THE LUNGS TWICE DAILY AS NEEDED FOR WHEEZING 10.2 g 4   hydrochlorothiazide (MICROZIDE) 12.5 MG capsule TK 1 C PO QD IN THE MORNING  1   No current facility-administered medications for this visit.    Review of Systems  Constitutional: negative Eyes: negative Ears, nose, mouth, throat, and face: negative Respiratory: positive for dyspnea on exertion Cardiovascular: negative Gastrointestinal: negative Genitourinary:negative Integument/breast: negative Hematologic/lymphatic: negative Musculoskeletal:negative Neurological: negative Behavioral/Psych: negative Endocrine: negative Allergic/Immunologic: negative  Physical Exam  XNA:TFTDD, healthy, no distress, well nourished, and well developed SKIN: skin color, texture, turgor are normal, no rashes or significant  lesions HEAD: Normocephalic, No masses, lesions, tenderness or abnormalities EYES: normal, PERRLA, Conjunctiva are pink and non-injected EARS: External ears normal, Canals clear OROPHARYNX:no exudate and no erythema  NECK: supple, no adenopathy, no JVD LYMPH:  no palpable lymphadenopathy, no hepatosplenomegaly LUNGS: clear to auscultation , and palpation HEART: regular rate & rhythm, no murmurs, and no gallops ABDOMEN:abdomen soft, non-tender, normal bowel sounds, and no masses or organomegaly BACK: Back symmetric, no curvature., No CVA tenderness EXTREMITIES:no joint deformities, effusion, or inflammation, no edema  NEURO: alert & oriented x 3 with fluent speech, no focal motor/sensory deficits  PERFORMANCE STATUS: ECOG 1  LABORATORY DATA: Lab Results  Component Value Date   WBC 5.7 06/16/2021   HGB 15.6 06/16/2021   HCT 47.0 06/16/2021   MCV 89.2 06/16/2021   PLT 277 06/16/2021      Chemistry      Component Value Date/Time   NA 141 06/16/2021 1404   K 3.8 06/16/2021 1404   CL 103 06/16/2021 1404   CO2 29 06/16/2021 1404   BUN 11 06/16/2021 1404   CREATININE 0.67 06/16/2021 1404   CREATININE 0.71 11/24/2019 1327      Component Value Date/Time   CALCIUM 9.8 06/16/2021  7573   AQVOHCS 91 06/16/2021 1404   AST 18 06/16/2021 1404   ALT 17 06/16/2021 1404   BILITOT 0.7 06/16/2021 1404       RADIOGRAPHIC STUDIES: CT Chest W Contrast  Result Date: 05/23/2021 CLINICAL DATA:  Left lung cancer, status post wedge resection, former smoker EXAM: CT CHEST WITH CONTRAST TECHNIQUE: Multidetector CT imaging of the chest was performed during intravenous contrast administration. CONTRAST:  74m ISOVUE-300 IOPAMIDOL (ISOVUE-300) INJECTION 61% COMPARISON:  11/18/2020 FINDINGS: Cardiovascular: Aortic atherosclerosis. Normal heart size. Left coronary artery calcifications. No pericardial effusion. Mediastinum/Nodes: No enlarged mediastinal, hilar, or axillary lymph nodes. Thyroid gland,  trachea, and esophagus demonstrate no significant findings. Lungs/Pleura: Mild centrilobular emphysema. Diffuse bilateral bronchial wall thickening. Unchanged postoperative findings of wedge resection primarily involving the superior segment left lower lobe (series 8, image 64). Unchanged 4 mm nodule of the posterior right upper lobe adjacent to a small pneumatocele (series 8, image 94). No pleural effusion or pneumothorax. Upper Abdomen: No acute abnormality. Unchanged, subcentimeter low-attenuation lesions scattered throughout the liver, likely small cysts and/or hemangiomata, incompletely characterized. Musculoskeletal: No chest wall mass or suspicious bone lesions identified. IMPRESSION: 1. Unchanged postoperative findings of wedge resection primarily involving the superior segment left lower lobe. No evidence of recurrent or metastatic disease in the chest. 2. Unchanged 4 mm nodule of the posterior right upper lobe adjacent to a small pneumatocele, likely benign sequelae of infection or inflammation. Attention on follow-up. 3. Emphysema and diffuse bilateral bronchial wall thickening. 4. Coronary artery disease. Aortic Atherosclerosis (ICD10-I70.0) and Emphysema (ICD10-J43.9). Electronically Signed   By: AEddie CandleM.D.   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 VDarcey Nora The patient also had recurrent disease presenting as stage Ia involving the right middle lobe status post SBRT in 2020 under the care of Dr. MTammi Klippel   PLAN: I had a lengthy discussion with the patient today about his current condition and treatment options.  The patient has been in observation since 2020 with no concerning findings for disease recurrence or metastasis.  His  first resection in 2015 showed positive EGFR mutation and the patient could benefit from targeted therapy in the future if he has any disease recurrence. He is feeling fine with no complaints today. I recommended for the patient to continue on observation with repeat CT scan of the chest in 1 year. He was advised to call immediately if he has any other concerning symptoms in the interval. The patient voices understanding of current disease status and treatment options and is in agreement with the current care plan.  All questions were answered. The patient knows to call the clinic with any problems, questions or concerns. We can certainly see the patient much sooner if necessary.  Thank you so much for allowing me to participate in the care of HGloris Ham. I will continue to follow up the patient with you and assist in his care.  The total time spent in the appointment was 60 minutes.  Disclaimer: This note was dictated with voice recognition software. Similar sounding words can inadvertently be transcribed and may not be corrected upon review.   MEilleen KempfAugust 8, 2022, 2:46 PM

## 2021-06-30 DIAGNOSIS — H900 Conductive hearing loss, bilateral: Secondary | ICD-10-CM | POA: Diagnosis not present

## 2021-09-08 DIAGNOSIS — I10 Essential (primary) hypertension: Secondary | ICD-10-CM | POA: Diagnosis not present

## 2021-09-08 DIAGNOSIS — E559 Vitamin D deficiency, unspecified: Secondary | ICD-10-CM | POA: Diagnosis not present

## 2021-09-08 DIAGNOSIS — Z86010 Personal history of colonic polyps: Secondary | ICD-10-CM | POA: Diagnosis not present

## 2021-09-08 DIAGNOSIS — C3432 Malignant neoplasm of lower lobe, left bronchus or lung: Secondary | ICD-10-CM | POA: Diagnosis not present

## 2021-09-08 DIAGNOSIS — J449 Chronic obstructive pulmonary disease, unspecified: Secondary | ICD-10-CM | POA: Diagnosis not present

## 2021-09-08 DIAGNOSIS — Z1211 Encounter for screening for malignant neoplasm of colon: Secondary | ICD-10-CM | POA: Diagnosis not present

## 2021-09-12 DIAGNOSIS — Z1211 Encounter for screening for malignant neoplasm of colon: Secondary | ICD-10-CM | POA: Diagnosis not present

## 2021-10-08 DIAGNOSIS — I10 Essential (primary) hypertension: Secondary | ICD-10-CM | POA: Diagnosis not present

## 2022-03-12 ENCOUNTER — Other Ambulatory Visit: Payer: Self-pay | Admitting: Physician Assistant

## 2022-03-12 DIAGNOSIS — J449 Chronic obstructive pulmonary disease, unspecified: Secondary | ICD-10-CM

## 2022-04-13 ENCOUNTER — Other Ambulatory Visit: Payer: Self-pay | Admitting: *Deleted

## 2022-04-13 DIAGNOSIS — Z85118 Personal history of other malignant neoplasm of bronchus and lung: Secondary | ICD-10-CM

## 2022-05-29 ENCOUNTER — Encounter: Payer: Medicare Other | Admitting: Thoracic Surgery (Cardiothoracic Vascular Surgery)

## 2022-06-12 ENCOUNTER — Ambulatory Visit (HOSPITAL_COMMUNITY)
Admission: RE | Admit: 2022-06-12 | Discharge: 2022-06-12 | Disposition: A | Discharge status: Home / Self Care | Payer: Medicare Other | Source: Ambulatory Visit | Attending: Internal Medicine | Admitting: Internal Medicine

## 2022-06-12 DIAGNOSIS — J479 Bronchiectasis, uncomplicated: Secondary | ICD-10-CM | POA: Diagnosis not present

## 2022-06-12 DIAGNOSIS — I700 Atherosclerosis of aorta: Secondary | ICD-10-CM | POA: Diagnosis not present

## 2022-06-12 DIAGNOSIS — J439 Emphysema, unspecified: Secondary | ICD-10-CM | POA: Diagnosis not present

## 2022-06-12 DIAGNOSIS — R911 Solitary pulmonary nodule: Secondary | ICD-10-CM | POA: Diagnosis not present

## 2022-06-12 DIAGNOSIS — C3490 Malignant neoplasm of unspecified part of unspecified bronchus or lung: Secondary | ICD-10-CM | POA: Insufficient documentation

## 2022-06-12 MED ORDER — IOHEXOL 300 MG/ML  SOLN
75.0000 mL | Freq: Once | INTRAMUSCULAR | Status: AC | PRN
Start: 2022-06-12 — End: 2022-06-12
  Administered 2022-06-12: 75 mL via INTRAVENOUS

## 2022-06-12 MED ORDER — SODIUM CHLORIDE (PF) 0.9 % IJ SOLN
INTRAMUSCULAR | Status: AC
  Filled 2022-06-12: qty 50

## 2022-06-15 ENCOUNTER — Inpatient Hospital Stay: Payer: Medicare Other | Attending: Internal Medicine

## 2022-06-15 ENCOUNTER — Other Ambulatory Visit: Payer: Self-pay

## 2022-06-15 DIAGNOSIS — I10 Essential (primary) hypertension: Secondary | ICD-10-CM | POA: Diagnosis not present

## 2022-06-15 DIAGNOSIS — C3432 Malignant neoplasm of lower lobe, left bronchus or lung: Secondary | ICD-10-CM | POA: Insufficient documentation

## 2022-06-15 DIAGNOSIS — C3490 Malignant neoplasm of unspecified part of unspecified bronchus or lung: Secondary | ICD-10-CM

## 2022-06-15 LAB — CBC WITH DIFFERENTIAL (CANCER CENTER ONLY)
Abs Immature Granulocytes: 0.03 10*3/uL (ref 0.00–0.07)
Basophils Absolute: 0 10*3/uL (ref 0.0–0.1)
Basophils Relative: 1 %
Eosinophils Absolute: 0.1 10*3/uL (ref 0.0–0.5)
Eosinophils Relative: 1 %
HCT: 43.3 % (ref 39.0–52.0)
Hemoglobin: 14.4 g/dL (ref 13.0–17.0)
Immature Granulocytes: 1 %
Lymphocytes Relative: 30 %
Lymphs Abs: 1.8 10*3/uL (ref 0.7–4.0)
MCH: 29.6 pg (ref 26.0–34.0)
MCHC: 33.3 g/dL (ref 30.0–36.0)
MCV: 89.1 fL (ref 80.0–100.0)
Monocytes Absolute: 0.6 10*3/uL (ref 0.1–1.0)
Monocytes Relative: 10 %
Neutro Abs: 3.4 10*3/uL (ref 1.7–7.7)
Neutrophils Relative %: 57 %
Platelet Count: 332 10*3/uL (ref 150–400)
RBC: 4.86 MIL/uL (ref 4.22–5.81)
RDW: 12.9 % (ref 11.5–15.5)
WBC Count: 5.9 10*3/uL (ref 4.0–10.5)
nRBC: 0 % (ref 0.0–0.2)

## 2022-06-15 LAB — CMP (CANCER CENTER ONLY)
ALT: 9 U/L (ref 0–44)
AST: 14 U/L — ABNORMAL LOW (ref 15–41)
Albumin: 4.1 g/dL (ref 3.5–5.0)
Alkaline Phosphatase: 73 U/L (ref 38–126)
Anion gap: 3 — ABNORMAL LOW (ref 5–15)
BUN: 10 mg/dL (ref 8–23)
CO2: 34 mmol/L — ABNORMAL HIGH (ref 22–32)
Calcium: 9.2 mg/dL (ref 8.9–10.3)
Chloride: 101 mmol/L (ref 98–111)
Creatinine: 0.63 mg/dL (ref 0.61–1.24)
GFR, Estimated: >60 mL/min (ref >60)
Glucose, Bld: 90 mg/dL (ref 70–99)
Potassium: 3.9 mmol/L (ref 3.5–5.1)
Sodium: 138 mmol/L (ref 135–145)
Total Bilirubin: 0.5 mg/dL (ref 0.3–1.2)
Total Protein: 7.2 g/dL (ref 6.5–8.1)

## 2022-06-17 ENCOUNTER — Encounter: Payer: Self-pay | Admitting: Internal Medicine

## 2022-06-17 ENCOUNTER — Inpatient Hospital Stay (HOSPITAL_BASED_OUTPATIENT_CLINIC_OR_DEPARTMENT_OTHER): Payer: Medicare Other | Admitting: Internal Medicine

## 2022-06-17 ENCOUNTER — Other Ambulatory Visit: Payer: Self-pay

## 2022-06-17 VITALS — BP 120/101 | HR 77 | Temp 97.5°F | Resp 16 | Wt 115.5 lb

## 2022-06-17 DIAGNOSIS — C3490 Malignant neoplasm of unspecified part of unspecified bronchus or lung: Secondary | ICD-10-CM | POA: Diagnosis not present

## 2022-06-17 DIAGNOSIS — C3432 Malignant neoplasm of lower lobe, left bronchus or lung: Secondary | ICD-10-CM | POA: Diagnosis not present

## 2022-06-17 DIAGNOSIS — I10 Essential (primary) hypertension: Secondary | ICD-10-CM | POA: Diagnosis not present

## 2022-06-17 NOTE — Progress Notes (Signed)
[facility] [phone]   [phone]) [phone]  OFFICE PROGRESS NOTE  Cari Caraway, MD [place] [address]  DIAGNOSIS:   1) stage Ia (T1b, N0, M0) non-small cell lung cancer, adenocarcinoma with positive EGFR mutation in exon 21 ([id number]) diagnosed in December 2015. 2) Recurrent disease presenting as stage Ia involving the right middle lobe.  PRIOR THERAPY: 1)  status post left lower lobe wedge resection under the care of Dr. Darcey Nora. 2)  status post SBRT in 2020 under the care of Dr. Tammi Klippel.   CURRENT THERAPY: Observation  INTERVAL HISTORY: Kyle Hamilton. 66 y.o. male returns to the clinic today for follow-up visit.  The patient is feeling fine today with no concerning complaints except for shortness of breath with exertion.  He denied having any chest pain, cough or hemoptysis.  He has no nausea, vomiting, diarrhea or constipation.  He has no headache or visual changes.  He denied having any recent weight loss or night sweats.  He had repeat CT scan of the chest performed recently and he is here for evaluation and discussion of his scan results.  MEDICAL HISTORY: Past Medical History:  Diagnosis Date   Chronic mastoiditis, bilateral    COPD (chronic obstructive pulmonary disease) (HCC)    Hearing loss    Hypertension    Lung cancer ([facility] [facility])    Pneumothorax, left    History of post op   Prostate cancer ([place])    Shortness of breath dyspnea    on exertion   Spinal stenosis     ALLERGIES:  has No Known Allergies.  MEDICATIONS:  Current Outpatient Medications  Medication Sig Dispense Refill   budesonide-formoterol (SYMBICORT) 160-4.5 MCG/ACT inhaler INHALE 2 PUFFS INTO THE LUNGS TWICE DAILY AS NEEDED FOR WHEEZING 10.2 g 4   hydrochlorothiazide (MICROZIDE) 12.5 MG capsule TK 1 C PO QD IN THE MORNING  1   No current facility-administered medications for this visit.    SURGICAL HISTORY:  Past Surgical History:  Procedure  Laterality Date   BAHA REVISION Right 08/30/2014   Procedure: BONE ANCHORED HEARING AID (BAHA) REVISION RIGHT SIDE;  Surgeon: Izora Gala, MD;  Location: [facility];  Service: ENT;  Laterality: Right;   BONE ANCHORED HEARING AID IMPLANT  08/03/2012   Procedure: BONE ANCHORED HEARING AID (BAHA) IMPLANT;  Surgeon: Izora Gala, MD;  Location: [facility];  Service: ENT;  Laterality: Right;   COLONOSCOPY  2007   CYSTOSCOPY  03/25/2018   Procedure: CYSTOSCOPY;  Surgeon: Kathie Rhodes, MD;  Location: [facility];  Service: Urology;;  no seeds noted in bladder   LEG SURGERY     right leg deep cut   [place] N/A 03/25/2018   Procedure: RADIOACTIVE SEED IMPLANT/BRACHYTHERAPY IMPLANT;  Surgeon: Kathie Rhodes, MD;  Location: [facility];  Service: Urology;  Laterality: N/A;  71 seeds implanted   SPACE OAR INSTILLATION N/A 03/25/2018   Procedure: SPACE OAR INSTILLATION;  Surgeon: Kathie Rhodes, MD;  Location: [facility];  Service: Urology;  Laterality: N/A;   STAPEDES SURGERY     bilateral   VIDEO ASSISTED THORACOSCOPY (VATS)/ LOBECTOMY Left 10/09/2014   Procedure: VIDEO ASSISTED THORACOSCOPY (VATS)/ LOBECTOMY;  Surgeon: Ivin Poot, MD;  Location: [facility] OR;  Service: Thoracic;  Laterality: Left;    REVIEW OF SYSTEMS:  A comprehensive review of systems was negative except for:  Respiratory: positive for dyspnea on exertion   PHYSICAL EXAMINATION: General appearance: alert, cooperative, fatigued, and no distress Head: Normocephalic, without obvious abnormality, atraumatic Neck: no adenopathy, no JVD, supple, symmetrical, trachea midline, and thyroid not enlarged, symmetric, no tenderness/mass/nodules Lymph nodes: Cervical, supraclavicular, and axillary nodes normal. Resp: clear to auscultation bilaterally Back: symmetric, no curvature. ROM normal. No CVA tenderness. Cardio: regular rate and rhythm, S1, S2 normal, no  murmur, click, rub or gallop GI: soft, non-tender; bowel sounds normal; no masses,  no organomegaly Extremities: extremities normal, atraumatic, no cyanosis or edema  ECOG PERFORMANCE STATUS: 1 - Symptomatic but completely ambulatory  Blood pressure (!) 120/101, pulse 77, temperature (!) 97.5 F (36.4 C), temperature source Oral, resp. rate 16, weight 115 lb 8 oz (52.4 kg), SpO2 95 %.  LABORATORY DATA: Lab Results  Component Value Date   WBC 5.9 06/15/2022   HGB 14.4 06/15/2022   HCT 43.3 06/15/2022   MCV 89.1 06/15/2022   PLT 332 06/15/2022      Chemistry      Component Value Date/Time   NA 138 06/15/2022 1151   K 3.9 06/15/2022 1151   CL 101 06/15/2022 1151   CO2 34 (H) 06/15/2022 1151   BUN 10 06/15/2022 1151   CREATININE 0.63 06/15/2022 1151   CREATININE 0.71 11/24/2019 1327      Component Value Date/Time   CALCIUM 9.2 06/15/2022 1151   ALKPHOS 73 06/15/2022 1151   AST 14 (L) 06/15/2022 1151   ALT 9 06/15/2022 1151   BILITOT 0.5 06/15/2022 1151       RADIOGRAPHIC STUDIES: CT Chest W Contrast  Result Date: 06/14/2022 CLINICAL DATA:  Non-small cell lung cancer, staging examination EXAM: CT CHEST WITH CONTRAST TECHNIQUE: Multidetector CT imaging of the chest was performed during intravenous contrast administration. RADIATION DOSE REDUCTION: This exam was performed according to the departmental dose-optimization program which includes automated exposure control, adjustment of the mA and/or kV according to patient size and/or use of iterative reconstruction technique. CONTRAST:  26m OMNIPAQUE IOHEXOL 300 MG/ML  SOLN COMPARISON:  05/23/2021 FINDINGS: Cardiovascular: Mild coronary artery calcification. Global cardiac size within normal limits. No pericardial effusion. Central pulmonary arteries are of normal caliber. Mild atherosclerotic calcification within the thoracic aorta. No aortic aneurysm. Mediastinum/Nodes: Visualized thyroid is unremarkable. No pathologic thoracic  adenopathy. Calcified intramural density within the wall of the mid esophagus at the level of the left mainstem bronchus is unchanged from multiple prior examinations. The esophagus is otherwise unremarkable. Lungs/Pleura: Surgical changes of wedge resection involving the superior segment of the left lower lobe is again identified. Mild centrilobular emphysema again noted. Stable pulmonary hyperinflation. There is stable mild slender cul bronchiectasis, best appreciated within the lower lung zones, with superimposed bronchial wall thickening in keeping with airway inflammation. Since the prior examination, the nodular soft tissue adjacent to a small pneumatocele within the right upper lobe has increased in size and demonstrates increasing spiculation now measuring 10 mm in mean diameter at axial image # 74/5 and demonstrating increasing retraction of the adjacent major fissure, concerning for a developing metachronous bronchogenic neoplasm. No other focal pulmonary nodules or infiltrates. No pneumothorax or pleural effusion. No central obstructing lesion. Upper Abdomen: Scattered hypodensities within the visualized liver are again identified most in keeping with multiple hepatic cysts. No acute abnormality. Musculoskeletal: No lytic or blastic bone lesion. IMPRESSION: 1. Interval increase in size and spiculation of a spiculated nodule within the right upper lobe adjacent to a small pneumatocele, concerning for a developing metachronous  bronchogenic neoplasm. PET CT examination is recommended for further evaluation. 2. Stable surgical changes of wedge resection within the superior segment of the left lower lobe. No evidence of local recurrence. 3. Mild centrilobular emphysema. 4. Stable mild bronchiectasis and bronchial wall thickening in keeping with airway inflammation. 5. Mild coronary artery calcification. Aortic Atherosclerosis (ICD10-I70.0) and Emphysema (ICD10-J43.9). Electronically Signed   By: Fidela Salisbury  M.D.   On: 06/14/2022 02:59    ASSESSMENT AND PLAN: This is a very pleasant 66 years old white male with history of stage Ia (T1b, N0, M0) non-small cell lung cancer, adenocarcinoma with positive EGFR mutation in exon 21 ([id number]) diagnosed in December 2015 status post left lower lobe wedge resection under the care of Dr. Darcey Nora. The patient also had recurrent disease presenting as stage Ia involving the right middle lobe status post SBRT in 2020 under the care of Dr. Tammi Klippel. The patient has been on observation and feeling fine except for the baseline shortness of breath. He had repeat CT scan of the chest performed recently.  I personally and independently reviewed the scan images and discussed the result and showed the images to the patient today. His scan showed interval increase in the size and spiculation of right upper lobe nodule adjacent to a small pneumatocele concerning for developing metachronous bronchogenic neoplasm. I recommended for the patient to have a PET scan for further evaluation of this lesion and to rule out any cancer recurrence. I will see him back for follow-up visit in 2 weeks for evaluation and discussion of the PET scan results and further recommendation regarding his condition. The patient was advised to call immediately if he has any other concerning symptoms in the interval. The patient voices understanding of current disease status and treatment options and is in agreement with the current care plan.  All questions were answered. The patient knows to call the clinic with any problems, questions or concerns. We can certainly see the patient much sooner if necessary.  The total time spent in the appointment was 20 minutes.  Disclaimer: This note was dictated with voice recognition software. Similar sounding words can inadvertently be transcribed and may not be corrected upon review.

## 2022-06-18 ENCOUNTER — Telehealth: Payer: Self-pay | Admitting: Physician Assistant

## 2022-06-18 NOTE — Telephone Encounter (Signed)
Scheduled per 08/09 los, patient has been called and notified.

## 2022-06-19 ENCOUNTER — Encounter: Payer: Medicare Other | Admitting: Thoracic Surgery (Cardiothoracic Vascular Surgery)

## 2022-06-25 ENCOUNTER — Encounter (HOSPITAL_COMMUNITY)
Admission: RE | Admit: 2022-06-25 | Discharge: 2022-06-25 | Disposition: A | Discharge status: Home / Self Care | Payer: Medicare Other | Source: Ambulatory Visit | Attending: Internal Medicine | Admitting: Internal Medicine

## 2022-06-25 DIAGNOSIS — C3490 Malignant neoplasm of unspecified part of unspecified bronchus or lung: Secondary | ICD-10-CM | POA: Diagnosis not present

## 2022-06-25 DIAGNOSIS — C3491 Malignant neoplasm of unspecified part of right bronchus or lung: Secondary | ICD-10-CM | POA: Diagnosis not present

## 2022-06-25 LAB — GLUCOSE, CAPILLARY: Glucose-Capillary: 83 mg/dL (ref 70–99)

## 2022-06-25 MED ORDER — FLUDEOXYGLUCOSE F - 18 (FDG) INJECTION
5.7300 | Freq: Once | INTRAVENOUS | Status: AC | PRN
  Administered 2022-06-25: 5.73 via INTRAVENOUS

## 2022-06-26 NOTE — Progress Notes (Unsigned)
[facility] OFFICE PROGRESS NOTE  Cari Caraway, [place] [address]  DIAGNOSIS:  1) stage Ia (T1b, N0, M0) non-small cell lung cancer, adenocarcinoma with positive EGFR mutation in exon 21 ([id number]) diagnosed in December 2015. 2) Recurrent disease presenting as stage Ia involving the right middle lobe.  PRIOR THERAPY: 1)  status post left lower lobe wedge resection under the care of Dr. Darcey Nora. 2)  status post SBRT in 2020 under the care of Dr. Tammi Klippel.   CURRENT THERAPY: Observation  INTERVAL HISTORY: Kyle Hamilton. 66 y.o. male returns to the clinic today for follow-up visit.  The patient saw Dr. Julien Nordmann recently for an annual 1 year follow-up on 06/17/2022.  At that point time, his CT scan showed slight interval increase in the size and spiculation of a spiculated nodule within the right upper lobe adjacent to the small pneumatocele, concerning for development of metachronous bronchogenic carcinoma.  Therefore, Dr. Julien Nordmann recommended a PET scan to further evaluate this.  Since last being seen, the patient denies any changes in his health.  Denies any fever, chills, night sweats, or unexplained weight loss.  Denies any chest pain, shortness of breath, cough, or hemoptysis.  Denies any nausea, vomiting, diarrhea, or constipation denies any headache or visual changes.  Denies any rashes or skin changes.  For the patient's initial tumor in 2015, he was positive for EGFR mutation.  He is here today for evaluation and more detailed discussion about his current condition and recommended treatment options.     MEDICAL HISTORY: Past Medical History:  Diagnosis Date   Chronic mastoiditis, bilateral    COPD (chronic obstructive pulmonary disease) (HCC)    Hearing loss    Hypertension    Lung cancer ([place])    Pneumothorax, left    History of post op   Prostate cancer ([place])    Shortness of breath dyspnea    on exertion   Spinal stenosis      ALLERGIES:  has No Known Allergies.  MEDICATIONS:  Current Outpatient Medications  Medication Sig Dispense Refill   budesonide-formoterol (SYMBICORT) 160-4.5 MCG/ACT inhaler INHALE 2 PUFFS INTO THE LUNGS TWICE DAILY AS NEEDED FOR WHEEZING 10.2 g 4   hydrochlorothiazide (MICROZIDE) 12.5 MG capsule TK 1 C PO QD IN THE MORNING  1   No current facility-administered medications for this visit.    SURGICAL HISTORY:  Past Surgical History:  Procedure Laterality Date   BAHA REVISION Right 08/30/2014   Procedure: BONE ANCHORED HEARING AID (BAHA) REVISION RIGHT SIDE;  Surgeon: Izora Gala, MD;  Location: [facility];  Service: ENT;  Laterality: Right;   BONE ANCHORED HEARING AID IMPLANT  08/03/2012   Procedure: BONE ANCHORED HEARING AID (BAHA) IMPLANT;  Surgeon: Izora Gala, MD;  Location: [facility];  Service: ENT;  Laterality: Right;   COLONOSCOPY  2007   CYSTOSCOPY  03/25/2018   Procedure: CYSTOSCOPY;  Surgeon: Kathie Rhodes, MD;  Location: [facility];  Service: Urology;;  no seeds noted in bladder   LEG SURGERY     right leg deep cut   [place] N/A 03/25/2018   Procedure: RADIOACTIVE SEED IMPLANT/BRACHYTHERAPY IMPLANT;  Surgeon: Kathie Rhodes, MD;  Location: [facility];  Service: Urology;  Laterality: N/A;  71 seeds implanted   SPACE OAR INSTILLATION N/A 03/25/2018   Procedure: SPACE OAR INSTILLATION;  Surgeon: Kathie Rhodes, MD;  Location: [facility];  Service: Urology;  Laterality: N/A;   STAPEDES SURGERY     bilateral   VIDEO ASSISTED THORACOSCOPY (VATS)/ LOBECTOMY Left 10/09/2014   Procedure: VIDEO ASSISTED THORACOSCOPY (VATS)/ LOBECTOMY;  Surgeon: Ivin Poot, MD;  Location: [facility] OR;  Service: Thoracic;  Laterality: Left;    REVIEW OF SYSTEMS:   Review of Systems  Constitutional: Negative for appetite change, chills, fatigue, fever and unexpected weight change.  HENT:   Negative for  mouth sores, nosebleeds, sore throat and trouble swallowing.   Eyes: Negative for eye problems and icterus.  Respiratory: Negative for cough, hemoptysis, shortness of breath and wheezing.   Cardiovascular: Negative for chest pain and leg swelling.  Gastrointestinal: Negative for abdominal pain, constipation, diarrhea, nausea and vomiting.  Genitourinary: Negative for bladder incontinence, difficulty urinating, dysuria, frequency and hematuria.   Musculoskeletal: Negative for back pain, gait problem, neck pain and neck stiffness.  Skin: Negative for itching and rash.  Neurological: Negative for dizziness, extremity weakness, gait problem, headaches, light-headedness and seizures.  Hematological: Negative for adenopathy. Does not bruise/bleed easily.  Psychiatric/Behavioral: Negative for confusion, depression and sleep disturbance. The patient is not nervous/anxious.     PHYSICAL EXAMINATION:  There were no vitals taken for this visit.  ECOG PERFORMANCE STATUS: {CHL ONC ECOG [id number]  Physical Exam  Constitutional: Oriented to person, place, and time and well-developed, well-nourished, and in no distress. No distress.  HENT:  Head: Normocephalic and atraumatic.  Mouth/Throat: Oropharynx is clear and moist. No oropharyngeal exudate.  Eyes: Conjunctivae are normal. Right eye exhibits no discharge. Left eye exhibits no discharge. No scleral icterus.  Neck: Normal range of motion. Neck supple.  Cardiovascular: Normal rate, regular rhythm, normal heart sounds and intact distal pulses.   Pulmonary/Chest: Effort normal and breath sounds normal. No respiratory distress. No wheezes. No rales.  Abdominal: Soft. Bowel sounds are normal. Exhibits no distension and no mass. There is no tenderness.  Musculoskeletal: Normal range of motion. Exhibits no edema.  Lymphadenopathy:    No cervical adenopathy.  Neurological: Alert and oriented to person, place, and time. Exhibits normal muscle tone.  Gait normal. Coordination normal.  Skin: Skin is warm and dry. No rash noted. Not diaphoretic. No erythema. No pallor.  Psychiatric: Mood, memory and judgment normal.  Vitals reviewed.  LABORATORY DATA: Lab Results  Component Value Date   WBC 5.9 06/15/2022   HGB 14.4 06/15/2022   HCT 43.3 06/15/2022   MCV 89.1 06/15/2022   PLT 332 06/15/2022      Chemistry      Component Value Date/Time   NA 138 06/15/2022 1151   K 3.9 06/15/2022 1151   CL 101 06/15/2022 1151   CO2 34 (H) 06/15/2022 1151   BUN 10 06/15/2022 1151   CREATININE 0.63 06/15/2022 1151   CREATININE 0.71 11/24/2019 1327      Component Value Date/Time   CALCIUM 9.2 06/15/2022 1151   ALKPHOS 73 06/15/2022 1151   AST 14 (L) 06/15/2022 1151   ALT 9 06/15/2022 1151   BILITOT 0.5 06/15/2022 1151       RADIOGRAPHIC STUDIES:  CT Chest W Contrast  Result Date: 06/14/2022 CLINICAL DATA:  Non-small cell lung cancer, staging examination EXAM: CT CHEST WITH CONTRAST TECHNIQUE: Multidetector CT imaging of the chest was performed during intravenous contrast administration. RADIATION DOSE REDUCTION: This exam was performed according to the departmental dose-optimization program which includes automated exposure control, adjustment of the mA and/or kV according to patient size and/or use of iterative reconstruction  technique. CONTRAST:  63m OMNIPAQUE IOHEXOL 300 MG/ML  SOLN COMPARISON:  05/23/2021 FINDINGS: Cardiovascular: Mild coronary artery calcification. Global cardiac size within normal limits. No pericardial effusion. Central pulmonary arteries are of normal caliber. Mild atherosclerotic calcification within the thoracic aorta. No aortic aneurysm. Mediastinum/Nodes: Visualized thyroid is unremarkable. No pathologic thoracic adenopathy. Calcified intramural density within the wall of the mid esophagus at the level of the left mainstem bronchus is unchanged from multiple prior examinations. The esophagus is otherwise  unremarkable. Lungs/Pleura: Surgical changes of wedge resection involving the superior segment of the left lower lobe is again identified. Mild centrilobular emphysema again noted. Stable pulmonary hyperinflation. There is stable mild slender cul bronchiectasis, best appreciated within the lower lung zones, with superimposed bronchial wall thickening in keeping with airway inflammation. Since the prior examination, the nodular soft tissue adjacent to a small pneumatocele within the right upper lobe has increased in size and demonstrates increasing spiculation now measuring 10 mm in mean diameter at axial image # 74/5 and demonstrating increasing retraction of the adjacent major fissure, concerning for a developing metachronous bronchogenic neoplasm. No other focal pulmonary nodules or infiltrates. No pneumothorax or pleural effusion. No central obstructing lesion. Upper Abdomen: Scattered hypodensities within the visualized liver are again identified most in keeping with multiple hepatic cysts. No acute abnormality. Musculoskeletal: No lytic or blastic bone lesion. IMPRESSION: 1. Interval increase in size and spiculation of a spiculated nodule within the right upper lobe adjacent to a small pneumatocele, concerning for a developing metachronous bronchogenic neoplasm. PET CT examination is recommended for further evaluation. 2. Stable surgical changes of wedge resection within the superior segment of the left lower lobe. No evidence of local recurrence. 3. Mild centrilobular emphysema. 4. Stable mild bronchiectasis and bronchial wall thickening in keeping with airway inflammation. 5. Mild coronary artery calcification. Aortic Atherosclerosis (ICD10-I70.0) and Emphysema (ICD10-J43.9). Electronically Signed   By: AFidela SalisburyM.D.   On: 06/14/2022 02:59     ASSESSMENT/PLAN:  This is a very pleasant 66year old Caucasian male with a history of stage Ia (T1b, N0, M0) non-small cell lung cancer, adenocarcinoma.  He  presented with a positive EGFR mutation exon 21 (L861Q.).  This was diagnosed in December 2015.  He is status post left lower lobe wedge resection under the care of Dr. VDarcey Nora  He then had recurrent disease presenting with a stage Ia right middle lobe lesion status post SBRT in 2020 under the care of Dr. MTammi Klippel  More recently, the patient had a restaging CT scan which showed interval increase in size of a spiculated right upper lobe lung nodule adjacent to a small pneumatocele.  This is concerning for metachronous bronchogenic carcinoma.  Therefore, the patient had a PET scan performed to further evaluate this.  The patient was seen with Dr. MJulien Nordmanntoday.  Dr. MJulien Nordmannhad a lengthy discussion today with the patient about his current condition and recommended treatment options.  Dr. MJulien Nordmannrecommends***  Biopsy?  SBRT?  Follow-up?  Scans?  The patient was advised to call immediately if he has any concerning symptoms in the interval. The patient voices understanding of current disease status and treatment options and is in agreement with the current care plan. All questions were answered. The patient knows to call the clinic with any problems, questions or concerns. We can certainly see the patient much sooner if necessary       No orders of the defined types were placed in this encounter.    I spent {CHL ONC TIME  VISIT - UGQBV:[id number]} counseling the patient face to face. The total time spent in the appointment was {CHL ONC TIME VISIT - CMKLK:[id number]}.  Kyle Pavon L Fareedah Mahler, PA-C 06/26/22

## 2022-07-01 ENCOUNTER — Other Ambulatory Visit: Payer: Self-pay

## 2022-07-01 ENCOUNTER — Inpatient Hospital Stay (HOSPITAL_BASED_OUTPATIENT_CLINIC_OR_DEPARTMENT_OTHER): Payer: Medicare Other | Admitting: Physician Assistant

## 2022-07-01 VITALS — BP 142/94 | HR 93 | Temp 97.5°F | Resp 16 | Wt 114.0 lb

## 2022-07-01 DIAGNOSIS — I10 Essential (primary) hypertension: Secondary | ICD-10-CM | POA: Diagnosis not present

## 2022-07-01 DIAGNOSIS — C342 Malignant neoplasm of middle lobe, bronchus or lung: Secondary | ICD-10-CM | POA: Diagnosis not present

## 2022-07-01 DIAGNOSIS — C3432 Malignant neoplasm of lower lobe, left bronchus or lung: Secondary | ICD-10-CM | POA: Diagnosis not present

## 2022-07-07 ENCOUNTER — Telehealth: Payer: Self-pay | Admitting: Internal Medicine

## 2022-07-07 NOTE — Telephone Encounter (Signed)
Called patient regarding upcoming September appointment, left a voicemail.

## 2022-07-08 ENCOUNTER — Encounter: Payer: Self-pay | Admitting: Pulmonary Disease

## 2022-07-08 ENCOUNTER — Ambulatory Visit (INDEPENDENT_AMBULATORY_CARE_PROVIDER_SITE_OTHER): Payer: Medicare Other | Admitting: Pulmonary Disease

## 2022-07-08 VITALS — BP 122/70 | HR 85 | Temp 98.5°F | Ht 64.0 in | Wt 115.6 lb

## 2022-07-08 DIAGNOSIS — R911 Solitary pulmonary nodule: Secondary | ICD-10-CM | POA: Diagnosis not present

## 2022-07-08 DIAGNOSIS — C342 Malignant neoplasm of middle lobe, bronchus or lung: Secondary | ICD-10-CM | POA: Diagnosis not present

## 2022-07-08 DIAGNOSIS — Z01818 Encounter for other preprocedural examination: Secondary | ICD-10-CM

## 2022-07-08 DIAGNOSIS — Z923 Personal history of irradiation: Secondary | ICD-10-CM | POA: Diagnosis not present

## 2022-07-08 NOTE — Addendum Note (Signed)
Addended by: Retia Passe on: 07/08/2022 04:31 PM   Modules accepted: Orders

## 2022-07-08 NOTE — Progress Notes (Signed)
Synopsis: Referred in August 2023 for history of lung cancer, lung nodule by Cari Caraway, MD  Subjective:   PATIENT ID: Kyle Hamilton. GENDER: male DOB: [birth year], MRN: [id number]  Chief Complaint  Patient presents with   Consult    Consult for a possilbe bronch.     This is a 66 year old gentleman, history of left-sided pneumothorax, history of prostate cancer. .  Patient last seen in the office by medical oncology on 07/01/2022.  Patient diagnosed with a stage Ia, T1b, N0, M0 non-small cell lung cancer positive EGFR mutation in December 2015.  Also had recurrence of disease with a stage Ia in the right middle lobe.Patient had a recent nuclear medicine PET scan on 06/25/2022 with an enlarging spiculated right middle lobe lung nodule concerning for malignancy. patient was referred by medical oncology for consideration of repeat biopsy and tissue diagnosis to confirm recurrence of disease.  OV 07/08/2022: No complaints today.  Has had general anesthesia before.  Not on any blood thinners or antiplatelets.  Discussed need for biopsy today.  He is willing to proceed.    Past Medical History:  Diagnosis Date   Chronic mastoiditis, bilateral    COPD (chronic obstructive pulmonary disease) (HCC)    Hearing loss    Hypertension    Lung cancer ([place])    Pneumothorax, left    History of post op   Prostate cancer ([place])    Shortness of breath dyspnea    on exertion   Spinal stenosis      Family History  Problem Relation Age of Onset   Cancer Neg Hx      Past Surgical History:  Procedure Laterality Date   BAHA REVISION Right 08/30/2014   Procedure: BONE ANCHORED HEARING AID (BAHA) REVISION RIGHT SIDE;  Surgeon: Izora Gala, MD;  Location: [place];  Service: ENT;  Laterality: Right;   BONE ANCHORED HEARING AID IMPLANT  08/03/2012   Procedure: BONE ANCHORED HEARING AID (BAHA) IMPLANT;  Surgeon: Izora Gala, MD;  Location: [place];  Service: ENT;  Laterality: Right;   COLONOSCOPY  2007    CYSTOSCOPY  03/25/2018   Procedure: CYSTOSCOPY;  Surgeon: Kathie Rhodes, MD;  Location: [place];  Service: Urology;;  no seeds noted in bladder   LEG SURGERY     right leg deep cut   [place] N/A 03/25/2018   Procedure: RADIOACTIVE SEED IMPLANT/BRACHYTHERAPY IMPLANT;  Surgeon: Kathie Rhodes, MD;  Location: [place];  Service: Urology;  Laterality: N/A;  71 seeds implanted   SPACE OAR INSTILLATION N/A 03/25/2018   Procedure: SPACE OAR INSTILLATION;  Surgeon: Kathie Rhodes, MD;  Location: [facility];  Service: Urology;  Laterality: N/A;   STAPEDES SURGERY     bilateral   VIDEO ASSISTED THORACOSCOPY (VATS)/ LOBECTOMY Left 10/09/2014   Procedure: VIDEO ASSISTED THORACOSCOPY (VATS)/ LOBECTOMY;  Surgeon: Ivin Poot, MD;  Location: [facility] OR;  Service: Thoracic;  Laterality: Left;    Social History   Socioeconomic History   Marital status: Single    Spouse name: Not on file   Number of children: 3   Years of education: Not on file   Highest education level: Not on file  Occupational History   Occupation: retired    Comment: Games developer  Tobacco Use   Smoking status: Former    Packs/day: 1.00    Years: 35.00    Total pack years: 35.00  Types: Cigarettes    Quit date: 05/11/2014    Years since quitting: 8.1   Smokeless tobacco: Never  Vaping Use   Vaping Use: Never used  Substance and Sexual Activity   Alcohol use: No   Drug use: No   Sexual activity: Never  Other Topics Concern   Not on file  Social History Narrative   Not on file   Social Determinants of Health   Financial Resource Strain: Not on file  Food Insecurity: Not on file  Transportation Needs: Not on file  Physical Activity: Not on file  Stress: Not on file  Social Connections: Not on file  Intimate Partner Violence: Not on file     No Known Allergies   Outpatient Medications Prior to Visit  Medication  Sig Dispense Refill   budesonide-formoterol (SYMBICORT) 160-4.5 MCG/ACT inhaler INHALE 2 PUFFS INTO THE LUNGS TWICE DAILY AS NEEDED FOR WHEEZING 10.2 g 4   hydrochlorothiazide (MICROZIDE) 12.5 MG capsule TK 1 C PO QD IN THE MORNING  1   No facility-administered medications prior to visit.    Review of Systems  Constitutional:  Negative for chills, fever, malaise/fatigue and weight loss.  HENT:  Negative for hearing loss, sore throat and tinnitus.   Eyes:  Negative for blurred vision and double vision.  Respiratory:  Positive for shortness of breath. Negative for cough, hemoptysis, sputum production, wheezing and stridor.   Cardiovascular:  Negative for chest pain, palpitations, orthopnea, leg swelling and PND.  Gastrointestinal:  Negative for abdominal pain, constipation, diarrhea, heartburn, nausea and vomiting.  Genitourinary:  Negative for dysuria, hematuria and urgency.  Musculoskeletal:  Negative for joint pain and myalgias.  Skin:  Negative for itching and rash.  Neurological:  Negative for dizziness, tingling, weakness and headaches.  Endo/Heme/Allergies:  Negative for environmental allergies. Does not bruise/bleed easily.  Psychiatric/Behavioral:  Negative for depression. The patient is not nervous/anxious and does not have insomnia.   All other systems reviewed and are negative.    Objective:  Physical Exam Vitals reviewed.  Constitutional:      General: He is not in acute distress.    Appearance: He is well-developed.  HENT:     Head: Normocephalic and atraumatic.  Eyes:     General: No scleral icterus.    Conjunctiva/sclera: Conjunctivae normal.     Pupils: Pupils are equal, round, and reactive to light.  Neck:     Vascular: No JVD.     Trachea: No tracheal deviation.  Cardiovascular:     Rate and Rhythm: Normal rate and regular rhythm.     Heart sounds: Normal heart sounds. No murmur heard. Pulmonary:     Effort: Pulmonary effort is normal. No tachypnea,  accessory muscle usage or respiratory distress.     Breath sounds: No stridor. No wheezing, rhonchi or rales.     Comments: Diminished breath sounds bilaterally Abdominal:     General: There is no distension.     Palpations: Abdomen is soft.     Tenderness: There is no abdominal tenderness.  Musculoskeletal:        General: No tenderness.     Cervical back: Neck supple.  Lymphadenopathy:     Cervical: No cervical adenopathy.  Skin:    General: Skin is warm and dry.     Capillary Refill: Capillary refill takes less than 2 seconds.     Findings: No rash.  Neurological:     Mental Status: He is alert and oriented to person, place, and time.  Psychiatric:        Behavior: Behavior normal.      Vitals:   07/08/22 1329  BP: 122/70  Pulse: 85  Temp: 98.5 F (36.9 C)  TempSrc: Oral  SpO2: 92%  Weight: 115 lb 9.6 oz (52.4 kg)  Height: '5\' 4"'  (1.626 m)   92% on RA BMI Readings from Last 3 Encounters:  07/08/22 19.84 kg/m  07/01/22 18.97 kg/m  06/17/22 19.22 kg/m   Wt Readings from Last 3 Encounters:  07/08/22 115 lb 9.6 oz (52.4 kg)  07/01/22 114 lb (51.7 kg)  06/17/22 115 lb 8 oz (52.4 kg)     CBC    Component Value Date/Time   WBC 5.9 06/15/2022 1151   WBC 6.2 03/18/2018 1357   RBC 4.86 06/15/2022 1151   HGB 14.4 06/15/2022 1151   HCT 43.3 06/15/2022 1151   PLT 332 06/15/2022 1151   MCV 89.1 06/15/2022 1151   MCH 29.6 06/15/2022 1151   MCHC 33.3 06/15/2022 1151   RDW 12.9 06/15/2022 1151   LYMPHSABS 1.8 06/15/2022 1151   MONOABS 0.6 06/15/2022 1151   EOSABS 0.1 06/15/2022 1151   BASOSABS 0.0 06/15/2022 1151    Chest Imaging:  06/12/2022: Enlarging spiculated nodule in the right upper lobe concerning for metachronous primary. The patient's images have been independently reviewed by me.    Pulmonary Functions Testing Results:    Latest Ref Rng & Units 09/26/2014    8:41 AM  PFT Results  FVC-Pre L 3.06   FVC-Predicted Pre % 76   FVC-Post L 3.56    FVC-Predicted Post % 89   Pre FEV1/FVC % % 41   Post FEV1/FCV % % 47   FEV1-Pre L 1.25   FEV1-Predicted Pre % 41   FEV1-Post L 1.67   DLCO uncorrected ml/min/mmHg 20.25   DLCO UNC% % 79   DLVA Predicted % 84   TLC L 7.51   TLC % Predicted % 125   RV % Predicted % 214     FeNO:   Pathology:   Echocardiogram:   Heart Catheterization:     Assessment & Plan:     ICD-10-CM   1. Primary cancer of right middle lobe of lung (HCC)  C34.2     2. Nodule of middle lobe of right lung  R91.1 Procedural/ Surgical Case Request: ROBOTIC ASSISTED NAVIGATIONAL BRONCHOSCOPY    Ambulatory referral to Pulmonology    3. History of external beam radiation therapy  Z92.3       Discussion:  This is a 66 year old gentleman history of stage I lung cancer status post SBRT.  Subsequent follow-up images with medical oncology.  Recent imaging suggestive of a new nodule in the right lung concerning for malignancy.  Plan: We gust the pros and cons needed for biopsy. Patient does have associated emphysema. I think robotic assisted navigational bronchoscopy with direct imaging using three-dimensional cone beam CT imaging offers the safest approach for biopsying a central lesion like his. He does have a slight increased risk for complication rate such as pneumothorax. However this is still a safer option than undergoing a percutaneous trans thoracic needle approach. Patient is agreeable to proceed with robotic assisted navigational bronchoscopy with tissue sampling.  We talked about the risk of bleeding and pneumothorax. Tentative bronchoscopy date will be on 07/21/2022. We appreciate PCC's help with scheduling.    Current Outpatient Medications:    budesonide-formoterol (SYMBICORT) 160-4.5 MCG/ACT inhaler, INHALE 2 PUFFS INTO THE LUNGS TWICE DAILY AS NEEDED FOR WHEEZING,  Disp: 10.2 g, Rfl: 4   hydrochlorothiazide (MICROZIDE) 12.5 MG capsule, TK 1 C PO QD IN THE MORNING, Disp: , Rfl: 1  I spent 62  minutes dedicated to the care of this patient on the date of this encounter to include pre-visit review of records, face-to-face time with the patient discussing conditions above, post visit ordering of testing, clinical documentation with the electronic health record, making appropriate referrals as documented, and communicating necessary findings to members of the patients care team.   Garner Nash, [place] [facility] 07/08/2022 1:46 PM

## 2022-07-08 NOTE — Patient Instructions (Signed)
Thank you for visiting Dr. Valeta Harms at [facility]. Today we recommend the following:  Orders Placed This Encounter  Procedures   Procedural/ Surgical Case Request: ROBOTIC ASSISTED NAVIGATIONAL BRONCHOSCOPY   Ambulatory referral to Pulmonology   Bronchoscopy on 07/21/2022  Return in about 20 days (around 07/28/2022) for with Eric Form, NP.    Please do your part to reduce the spread of COVID-19.

## 2022-07-08 NOTE — H&P (View-Only) (Signed)
Synopsis: Referred in August 2023 for history of lung cancer, lung nodule by Cari Caraway, MD  Subjective:   PATIENT ID: Kyle Hamilton. GENDER: male DOB: [birth year], MRN: [id number]  Chief Complaint  Patient presents with   Consult    Consult for a possilbe bronch.     This is a 66 year old gentleman, history of left-sided pneumothorax, history of prostate cancer. .  Patient last seen in the office by medical oncology on 07/01/2022.  Patient diagnosed with a stage Ia, T1b, N0, M0 non-small cell lung cancer positive EGFR mutation in December 2015.  Also had recurrence of disease with a stage Ia in the right middle lobe.Patient had a recent nuclear medicine PET scan on 06/25/2022 with an enlarging spiculated right middle lobe lung nodule concerning for malignancy. patient was referred by medical oncology for consideration of repeat biopsy and tissue diagnosis to confirm recurrence of disease.  OV 07/08/2022: No complaints today.  Has had general anesthesia before.  Not on any blood thinners or antiplatelets.  Discussed need for biopsy today.  He is willing to proceed.    Past Medical History:  Diagnosis Date   Chronic mastoiditis, bilateral    COPD (chronic obstructive pulmonary disease) (HCC)    Hearing loss    Hypertension    Lung cancer ([place])    Pneumothorax, left    History of post op   Prostate cancer ([place])    Shortness of breath dyspnea    on exertion   Spinal stenosis      Family History  Problem Relation Age of Onset   Cancer Neg Hx      Past Surgical History:  Procedure Laterality Date   BAHA REVISION Right 08/30/2014   Procedure: BONE ANCHORED HEARING AID (BAHA) REVISION RIGHT SIDE;  Surgeon: Izora Gala, MD;  Location: [place];  Service: ENT;  Laterality: Right;   BONE ANCHORED HEARING AID IMPLANT  08/03/2012   Procedure: BONE ANCHORED HEARING AID (BAHA) IMPLANT;  Surgeon: Izora Gala, MD;  Location: [place];  Service: ENT;  Laterality: Right;   COLONOSCOPY  2007    CYSTOSCOPY  03/25/2018   Procedure: CYSTOSCOPY;  Surgeon: Kathie Rhodes, MD;  Location: [place];  Service: Urology;;  no seeds noted in bladder   LEG SURGERY     right leg deep cut   [place] N/A 03/25/2018   Procedure: RADIOACTIVE SEED IMPLANT/BRACHYTHERAPY IMPLANT;  Surgeon: Kathie Rhodes, MD;  Location: [place];  Service: Urology;  Laterality: N/A;  71 seeds implanted   SPACE OAR INSTILLATION N/A 03/25/2018   Procedure: SPACE OAR INSTILLATION;  Surgeon: Kathie Rhodes, MD;  Location: [facility];  Service: Urology;  Laterality: N/A;   STAPEDES SURGERY     bilateral   VIDEO ASSISTED THORACOSCOPY (VATS)/ LOBECTOMY Left 10/09/2014   Procedure: VIDEO ASSISTED THORACOSCOPY (VATS)/ LOBECTOMY;  Surgeon: Ivin Poot, MD;  Location: [facility] OR;  Service: Thoracic;  Laterality: Left;    Social History   Socioeconomic History   Marital status: Single    Spouse name: Not on file   Number of children: 3   Years of education: Not on file   Highest education level: Not on file  Occupational History   Occupation: retired    Comment: Games developer  Tobacco Use   Smoking status: Former    Packs/day: 1.00    Years: 35.00    Total pack years: 35.00  Types: Cigarettes    Quit date: 05/11/2014    Years since quitting: 8.1   Smokeless tobacco: Never  Vaping Use   Vaping Use: Never used  Substance and Sexual Activity   Alcohol use: No   Drug use: No   Sexual activity: Never  Other Topics Concern   Not on file  Social History Narrative   Not on file   Social Determinants of Health   Financial Resource Strain: Not on file  Food Insecurity: Not on file  Transportation Needs: Not on file  Physical Activity: Not on file  Stress: Not on file  Social Connections: Not on file  Intimate Partner Violence: Not on file     No Known Allergies   Outpatient Medications Prior to Visit  Medication  Sig Dispense Refill   budesonide-formoterol (SYMBICORT) 160-4.5 MCG/ACT inhaler INHALE 2 PUFFS INTO THE LUNGS TWICE DAILY AS NEEDED FOR WHEEZING 10.2 g 4   hydrochlorothiazide (MICROZIDE) 12.5 MG capsule TK 1 C PO QD IN THE MORNING  1   No facility-administered medications prior to visit.    Review of Systems  Constitutional:  Negative for chills, fever, malaise/fatigue and weight loss.  HENT:  Negative for hearing loss, sore throat and tinnitus.   Eyes:  Negative for blurred vision and double vision.  Respiratory:  Positive for shortness of breath. Negative for cough, hemoptysis, sputum production, wheezing and stridor.   Cardiovascular:  Negative for chest pain, palpitations, orthopnea, leg swelling and PND.  Gastrointestinal:  Negative for abdominal pain, constipation, diarrhea, heartburn, nausea and vomiting.  Genitourinary:  Negative for dysuria, hematuria and urgency.  Musculoskeletal:  Negative for joint pain and myalgias.  Skin:  Negative for itching and rash.  Neurological:  Negative for dizziness, tingling, weakness and headaches.  Endo/Heme/Allergies:  Negative for environmental allergies. Does not bruise/bleed easily.  Psychiatric/Behavioral:  Negative for depression. The patient is not nervous/anxious and does not have insomnia.   All other systems reviewed and are negative.    Objective:  Physical Exam Vitals reviewed.  Constitutional:      General: He is not in acute distress.    Appearance: He is well-developed.  HENT:     Head: Normocephalic and atraumatic.  Eyes:     General: No scleral icterus.    Conjunctiva/sclera: Conjunctivae normal.     Pupils: Pupils are equal, round, and reactive to light.  Neck:     Vascular: No JVD.     Trachea: No tracheal deviation.  Cardiovascular:     Rate and Rhythm: Normal rate and regular rhythm.     Heart sounds: Normal heart sounds. No murmur heard. Pulmonary:     Effort: Pulmonary effort is normal. No tachypnea,  accessory muscle usage or respiratory distress.     Breath sounds: No stridor. No wheezing, rhonchi or rales.     Comments: Diminished breath sounds bilaterally Abdominal:     General: There is no distension.     Palpations: Abdomen is soft.     Tenderness: There is no abdominal tenderness.  Musculoskeletal:        General: No tenderness.     Cervical back: Neck supple.  Lymphadenopathy:     Cervical: No cervical adenopathy.  Skin:    General: Skin is warm and dry.     Capillary Refill: Capillary refill takes less than 2 seconds.     Findings: No rash.  Neurological:     Mental Status: He is alert and oriented to person, place, and time.  Psychiatric:        Behavior: Behavior normal.      Vitals:   07/08/22 1329  BP: 122/70  Pulse: 85  Temp: 98.5 F (36.9 C)  TempSrc: Oral  SpO2: 92%  Weight: 115 lb 9.6 oz (52.4 kg)  Height: '5\' 4"'  (1.626 m)   92% on RA BMI Readings from Last 3 Encounters:  07/08/22 19.84 kg/m  07/01/22 18.97 kg/m  06/17/22 19.22 kg/m   Wt Readings from Last 3 Encounters:  07/08/22 115 lb 9.6 oz (52.4 kg)  07/01/22 114 lb (51.7 kg)  06/17/22 115 lb 8 oz (52.4 kg)     CBC    Component Value Date/Time   WBC 5.9 06/15/2022 1151   WBC 6.2 03/18/2018 1357   RBC 4.86 06/15/2022 1151   HGB 14.4 06/15/2022 1151   HCT 43.3 06/15/2022 1151   PLT 332 06/15/2022 1151   MCV 89.1 06/15/2022 1151   MCH 29.6 06/15/2022 1151   MCHC 33.3 06/15/2022 1151   RDW 12.9 06/15/2022 1151   LYMPHSABS 1.8 06/15/2022 1151   MONOABS 0.6 06/15/2022 1151   EOSABS 0.1 06/15/2022 1151   BASOSABS 0.0 06/15/2022 1151    Chest Imaging:  06/12/2022: Enlarging spiculated nodule in the right upper lobe concerning for metachronous primary. The patient's images have been independently reviewed by me.    Pulmonary Functions Testing Results:    Latest Ref Rng & Units 09/26/2014    8:41 AM  PFT Results  FVC-Pre L 3.06   FVC-Predicted Pre % 76   FVC-Post L 3.56    FVC-Predicted Post % 89   Pre FEV1/FVC % % 41   Post FEV1/FCV % % 47   FEV1-Pre L 1.25   FEV1-Predicted Pre % 41   FEV1-Post L 1.67   DLCO uncorrected ml/min/mmHg 20.25   DLCO UNC% % 79   DLVA Predicted % 84   TLC L 7.51   TLC % Predicted % 125   RV % Predicted % 214     FeNO:   Pathology:   Echocardiogram:   Heart Catheterization:     Assessment & Plan:     ICD-10-CM   1. Primary cancer of right middle lobe of lung (HCC)  C34.2     2. Nodule of middle lobe of right lung  R91.1 Procedural/ Surgical Case Request: ROBOTIC ASSISTED NAVIGATIONAL BRONCHOSCOPY    Ambulatory referral to Pulmonology    3. History of external beam radiation therapy  Z92.3       Discussion:  This is a 66 year old gentleman history of stage I lung cancer status post SBRT.  Subsequent follow-up images with medical oncology.  Recent imaging suggestive of a new nodule in the right lung concerning for malignancy.  Plan: We gust the pros and cons needed for biopsy. Patient does have associated emphysema. I think robotic assisted navigational bronchoscopy with direct imaging using three-dimensional cone beam CT imaging offers the safest approach for biopsying a central lesion like his. He does have a slight increased risk for complication rate such as pneumothorax. However this is still a safer option than undergoing a percutaneous trans thoracic needle approach. Patient is agreeable to proceed with robotic assisted navigational bronchoscopy with tissue sampling.  We talked about the risk of bleeding and pneumothorax. Tentative bronchoscopy date will be on 07/21/2022. We appreciate PCC's help with scheduling.    Current Outpatient Medications:    budesonide-formoterol (SYMBICORT) 160-4.5 MCG/ACT inhaler, INHALE 2 PUFFS INTO THE LUNGS TWICE DAILY AS NEEDED FOR WHEEZING,  Disp: 10.2 g, Rfl: 4   hydrochlorothiazide (MICROZIDE) 12.5 MG capsule, TK 1 C PO QD IN THE MORNING, Disp: , Rfl: 1  I spent 62  minutes dedicated to the care of this patient on the date of this encounter to include pre-visit review of records, face-to-face time with the patient discussing conditions above, post visit ordering of testing, clinical documentation with the electronic health record, making appropriate referrals as documented, and communicating necessary findings to members of the patients care team.   Garner Nash, [facility] [facility] 07/08/2022 1:46 PM

## 2022-07-20 ENCOUNTER — Other Ambulatory Visit: Payer: Self-pay

## 2022-07-20 ENCOUNTER — Encounter (HOSPITAL_COMMUNITY): Payer: Self-pay | Admitting: Pulmonary Disease

## 2022-07-20 NOTE — Anesthesia Preprocedure Evaluation (Signed)
Anesthesia Evaluation  Patient identified by MRN, date of birth, ID band Patient awake    Reviewed: Allergy & Precautions, Patient's Chart, lab work & pertinent test results  Airway Mallampati: II  TM Distance: >3 FB Neck ROM: Full    Dental  (+) Edentulous Upper, Edentulous Lower   Pulmonary COPD, former smoker,  Lung nodule    Pulmonary exam normal        Cardiovascular hypertension, Pt. on medications  Rhythm:Regular Rate:Normal     Neuro/Psych negative neurological ROS  negative psych ROS   GI/Hepatic negative GI ROS, Neg liver ROS,   Endo/Other  negative endocrine ROS  Renal/GU negative Renal ROS  negative genitourinary   Musculoskeletal negative musculoskeletal ROS (+)   Abdominal Normal abdominal exam  (+)   Peds  Hematology negative hematology ROS (+)   Anesthesia Other Findings   Reproductive/Obstetrics                           Anesthesia Physical Anesthesia Plan  ASA: 3  Anesthesia Plan: General   Post-op Pain Management:    Induction: Intravenous  PONV Risk Score and Plan: 2 and Ondansetron, Dexamethasone and Treatment may vary due to age or medical condition  Airway Management Planned: Oral ETT and Mask  Additional Equipment: None  Intra-op Plan:   Post-operative Plan: Extubation in OR  Informed Consent: I have reviewed the patients History and Physical, chart, labs and discussed the procedure including the risks, benefits and alternatives for the proposed anesthesia with the patient or authorized representative who has indicated his/her understanding and acceptance.     Dental advisory given  Plan Discussed with: CRNA  Anesthesia Plan Comments: (Lab Results      Component                Value               Date                      WBC                      5.9                 06/15/2022                HGB                      14.4                 06/15/2022                HCT                      43.3                06/15/2022                MCV                      89.1                06/15/2022                PLT                      332  06/15/2022           Lab Results      Component                Value               Date                      NA                       138                 06/15/2022                K                        3.9                 06/15/2022                CO2                      34 (H)              06/15/2022                GLUCOSE                  90                  06/15/2022                BUN                      10                  06/15/2022                CREATININE               0.63                06/15/2022                CALCIUM                  9.2                 06/15/2022                GFRNONAA                 >60                 06/15/2022          )      Anesthesia Quick Evaluation

## 2022-07-20 NOTE — Progress Notes (Signed)
PCP - Andres Labrum, MD  EKG - DOS  ERAS Protcol - NPO  COVID TEST- DOS  Anesthesia review: N  Patient verbally denies any shortness of breath, fever, cough and chest pain during phone call   -------------  SDW INSTRUCTIONS given:  Your procedure is scheduled on 07/21/22.  Report to [facility] Main Entrance "A" at 0530 A.M., and check in at the Admitting office.  Call this number if you have problems the morning of surgery:  [phone]   Remember:  Do not eat or drink after midnight the night before your surgery    Take these medicines the morning of surgery with A SIP OF WATER  N/A  As of today, STOP taking any Aspirin (unless otherwise instructed by your surgeon) Aleve, Naproxen, Ibuprofen, Motrin, Advil, Goody's, BC's, all herbal medications, fish oil, and all vitamins.                      Do not wear jewelry, make up, or nail polish            Do not wear lotions, powders, perfumes/colognes, or deodorant.            Do not shave 48 hours prior to surgery.  Men may shave face and neck.            Do not bring valuables to the hospital.            [facility] is not responsible for any belongings or valuables.  Do NOT Smoke (Tobacco/Vaping) 24 hours prior to your procedure If you use a CPAP at night, you may bring all equipment for your overnight stay.   Contacts, glasses, dentures or bridgework may not be worn into surgery.      For patients admitted to the hospital, discharge time will be determined by your treatment team.   Patients discharged the day of surgery will not be allowed to drive home, and someone needs to stay with them for 24 hours.    Special instructions:   [facility]- Preparing For Surgery  Before surgery, you can play an important role. Because skin is not sterile, your skin needs to be as free of germs as possible. You can reduce the number of germs on your skin by washing with CHG (chlorahexidine gluconate) Soap before surgery.  CHG is an  antiseptic cleaner which kills germs and bonds with the skin to continue killing germs even after washing.    Oral Hygiene is also important to reduce your risk of infection.  Remember - BRUSH YOUR TEETH THE MORNING OF SURGERY WITH YOUR REGULAR TOOTHPASTE  Please do not use if you have an allergy to CHG or antibacterial soaps. If your skin becomes reddened/irritated stop using the CHG.  Do not shave (including legs and underarms) for at least 48 hours prior to first CHG shower. It is OK to shave your face.  Please follow these instructions carefully.   Shower the NIGHT BEFORE SURGERY and the MORNING OF SURGERY with DIAL Soap.   Pat yourself dry with a CLEAN TOWEL.  Wear CLEAN PAJAMAS to bed the night before surgery  Place CLEAN SHEETS on your bed the night of your first shower and DO NOT SLEEP WITH PETS.   Day of Surgery: Please shower morning of surgery  Wear Clean/Comfortable clothing the morning of surgery Do not apply any deodorants/lotions.   Remember to brush your teeth WITH YOUR REGULAR TOOTHPASTE.   Questions were answered. Patient  verbalized understanding of instructions.

## 2022-07-21 ENCOUNTER — Ambulatory Visit (HOSPITAL_BASED_OUTPATIENT_CLINIC_OR_DEPARTMENT_OTHER): Payer: Medicare Other | Admitting: Anesthesiology

## 2022-07-21 ENCOUNTER — Ambulatory Visit (HOSPITAL_COMMUNITY): Payer: Medicare Other

## 2022-07-21 ENCOUNTER — Encounter (HOSPITAL_COMMUNITY): Payer: Self-pay | Admitting: Pulmonary Disease

## 2022-07-21 ENCOUNTER — Ambulatory Visit (HOSPITAL_COMMUNITY): Payer: Medicare Other | Admitting: Anesthesiology

## 2022-07-21 ENCOUNTER — Other Ambulatory Visit: Payer: Self-pay

## 2022-07-21 ENCOUNTER — Ambulatory Visit (HOSPITAL_COMMUNITY)
Admission: RE | Admit: 2022-07-21 | Discharge: 2022-07-21 | Disposition: A | Discharge status: Home / Self Care | Payer: Medicare Other | Attending: Pulmonary Disease | Admitting: Pulmonary Disease

## 2022-07-21 ENCOUNTER — Encounter (HOSPITAL_COMMUNITY)
Admission: RE | Disposition: A | Discharge status: Home / Self Care | Payer: Self-pay | Source: Home / Self Care | Attending: Pulmonary Disease

## 2022-07-21 DIAGNOSIS — I10 Essential (primary) hypertension: Secondary | ICD-10-CM

## 2022-07-21 DIAGNOSIS — Z85118 Personal history of other malignant neoplasm of bronchus and lung: Secondary | ICD-10-CM | POA: Diagnosis not present

## 2022-07-21 DIAGNOSIS — Z923 Personal history of irradiation: Secondary | ICD-10-CM | POA: Diagnosis not present

## 2022-07-21 DIAGNOSIS — R918 Other nonspecific abnormal finding of lung field: Secondary | ICD-10-CM | POA: Diagnosis not present

## 2022-07-21 DIAGNOSIS — M19012 Primary osteoarthritis, left shoulder: Secondary | ICD-10-CM | POA: Diagnosis not present

## 2022-07-21 DIAGNOSIS — Z87891 Personal history of nicotine dependence: Secondary | ICD-10-CM

## 2022-07-21 DIAGNOSIS — Z79899 Other long term (current) drug therapy: Secondary | ICD-10-CM | POA: Diagnosis not present

## 2022-07-21 DIAGNOSIS — Z8546 Personal history of malignant neoplasm of prostate: Secondary | ICD-10-CM | POA: Insufficient documentation

## 2022-07-21 DIAGNOSIS — Z20822 Contact with and (suspected) exposure to covid-19: Secondary | ICD-10-CM | POA: Insufficient documentation

## 2022-07-21 DIAGNOSIS — R911 Solitary pulmonary nodule: Secondary | ICD-10-CM | POA: Diagnosis present

## 2022-07-21 DIAGNOSIS — M19011 Primary osteoarthritis, right shoulder: Secondary | ICD-10-CM | POA: Diagnosis not present

## 2022-07-21 DIAGNOSIS — J449 Chronic obstructive pulmonary disease, unspecified: Secondary | ICD-10-CM | POA: Diagnosis not present

## 2022-07-21 DIAGNOSIS — M47814 Spondylosis without myelopathy or radiculopathy, thoracic region: Secondary | ICD-10-CM | POA: Diagnosis not present

## 2022-07-21 HISTORY — PX: BRONCHIAL BIOPSY: SHX5109

## 2022-07-21 HISTORY — PX: FIDUCIAL MARKER PLACEMENT: SHX6858

## 2022-07-21 HISTORY — PX: BRONCHIAL NEEDLE ASPIRATION BIOPSY: SHX5106

## 2022-07-21 HISTORY — PX: BRONCHIAL BRUSHINGS: SHX5108

## 2022-07-21 LAB — CBC
HCT: 45.6 % (ref 39.0–52.0)
Hemoglobin: 14.6 g/dL (ref 13.0–17.0)
MCH: 29.6 pg (ref 26.0–34.0)
MCHC: 32 g/dL (ref 30.0–36.0)
MCV: 92.3 fL (ref 80.0–100.0)
Platelets: 268 10*3/uL (ref 150–400)
RBC: 4.94 MIL/uL (ref 4.22–5.81)
RDW: 13.2 % (ref 11.5–15.5)
WBC: 5.4 10*3/uL (ref 4.0–10.5)
nRBC: 0 % (ref 0.0–0.2)

## 2022-07-21 LAB — BASIC METABOLIC PANEL
Anion gap: 7 (ref 5–15)
BUN: 10 mg/dL (ref 8–23)
CO2: 30 mmol/L (ref 22–32)
Calcium: 9.1 mg/dL (ref 8.9–10.3)
Chloride: 100 mmol/L (ref 98–111)
Creatinine, Ser: 0.8 mg/dL (ref 0.61–1.24)
GFR, Estimated: >60 mL/min (ref >60)
Glucose, Bld: 95 mg/dL (ref 70–99)
Potassium: 3.4 mmol/L — ABNORMAL LOW (ref 3.5–5.1)
Sodium: 137 mmol/L (ref 135–145)

## 2022-07-21 LAB — SARS CORONAVIRUS 2 BY RT PCR: SARS Coronavirus 2 by RT PCR: NEGATIVE

## 2022-07-21 SURGERY — BRONCHOSCOPY, WITH BIOPSY USING ELECTROMAGNETIC NAVIGATION
Anesthesia: General | Laterality: Right

## 2022-07-21 MED ORDER — SUGAMMADEX SODIUM 200 MG/2ML IV SOLN
INTRAVENOUS | Status: DC | PRN
  Administered 2022-07-21: 200 mg via INTRAVENOUS

## 2022-07-21 MED ORDER — LACTATED RINGERS IV SOLN: INTRAVENOUS | Status: DC

## 2022-07-21 MED ORDER — PROPOFOL 10 MG/ML IV BOLUS
INTRAVENOUS | Status: DC | PRN
  Administered 2022-07-21: 30 mg via INTRAVENOUS
  Administered 2022-07-21: 100 mg via INTRAVENOUS

## 2022-07-21 MED ORDER — CHLORHEXIDINE GLUCONATE 0.12 % MT SOLN
15.0000 mL | Freq: Once | OROMUCOSAL | Status: AC
  Administered 2022-07-21: 15 mL via OROMUCOSAL
  Filled 2022-07-21 (×2): qty 15

## 2022-07-21 MED ORDER — ONDANSETRON HCL 4 MG/2ML IJ SOLN
INTRAMUSCULAR | Status: DC | PRN
  Administered 2022-07-21: 4 mg via INTRAVENOUS

## 2022-07-21 MED ORDER — PHENYLEPHRINE 80 MCG/ML (10ML) SYRINGE FOR IV PUSH (FOR BLOOD PRESSURE SUPPORT)
PREFILLED_SYRINGE | INTRAVENOUS | Status: DC | PRN
  Administered 2022-07-21: 80 ug via INTRAVENOUS

## 2022-07-21 MED ORDER — ROCURONIUM BROMIDE 10 MG/ML (PF) SYRINGE
PREFILLED_SYRINGE | INTRAVENOUS | Status: DC | PRN
  Administered 2022-07-21: 50 mg via INTRAVENOUS

## 2022-07-21 MED ORDER — PHENYLEPHRINE HCL-NACL 20-0.9 MG/250ML-% IV SOLN
INTRAVENOUS | Status: DC | PRN
  Administered 2022-07-21: 50 ug/min via INTRAVENOUS

## 2022-07-21 MED ORDER — LIDOCAINE 2% (20 MG/ML) 5 ML SYRINGE
INTRAMUSCULAR | Status: DC | PRN
  Administered 2022-07-21: 100 mg via INTRAVENOUS

## 2022-07-21 MED ORDER — PROPOFOL 500 MG/50ML IV EMUL
INTRAVENOUS | Status: DC | PRN
  Administered 2022-07-21: 150 ug/kg/min via INTRAVENOUS

## 2022-07-21 SURGICAL SUPPLY — 1 items: superlock fiducial marker IMPLANT

## 2022-07-21 NOTE — Discharge Instructions (Signed)
Flexible Bronchoscopy, Care After This sheet gives you information about how to care for yourself after your test. Your doctor may also give you more specific instructions. If you have problems or questions, contact your doctor. Follow these instructions at home: Eating and drinking Do not eat or drink anything (not even water) for 2 hours after your test, or until your numbing medicine (local anesthetic) wears off. When your numbness is gone and your cough and gag reflexes have come back, you may: Eat only soft foods. Slowly drink liquids. The day after the test, go back to your normal diet. Driving Do not drive for 24 hours if you were given a medicine to help you relax (sedative). Do not drive or use heavy machinery while taking prescription pain medicine. General instructions  Take over-the-counter and prescription medicines only as told by your doctor. Return to your normal activities as told. Ask what activities are safe for you. Do not use any products that have nicotine or tobacco in them. This includes cigarettes and e-cigarettes. If you need help quitting, ask your doctor. Keep all follow-up visits as told by your doctor. This is important. It is very important if you had a tissue sample (biopsy) taken. Get help right away if: You have shortness of breath that gets worse. You get light-headed. You feel like you are going to pass out (faint). You have chest pain. You cough up: More than a little blood. More blood than before. Summary Do not eat or drink anything (not even water) for 2 hours after your test, or until your numbing medicine wears off. Do not use cigarettes. Do not use e-cigarettes. Get help right away if you have chest pain.  This information is not intended to replace advice given to you by your health care provider. Make sure you discuss any questions you have with your health care provider. Document Released: 08/23/2009 Document Revised: 10/08/2017 Document  Reviewed: 11/13/2016 Elsevier Patient Education  2020 [employer].

## 2022-07-21 NOTE — Transfer of Care (Signed)
Immediate Anesthesia Transfer of Care Note  Patient: Kyle Hamilton.  Procedure(s) Performed: ROBOTIC ASSISTED NAVIGATIONAL BRONCHOSCOPY (Right) BRONCHIAL BIOPSIES FIDUCIAL MARKER PLACEMENT BRONCHIAL NEEDLE ASPIRATION BIOPSIES BRONCHIAL BRUSHINGS  Patient Location: PACU  Anesthesia Type:General  Level of Consciousness: drowsy  Airway & Oxygen Therapy: Patient Spontanous Breathing and Patient connected to nasal cannula oxygen  Post-op Assessment: Report given to RN and Post -op Vital signs reviewed and stable  Post vital signs: Reviewed and stable  Last Vitals:  Vitals Value Taken Time  BP 125/81 07/21/22 0845  Temp    Pulse 60 07/21/22 0847  Resp 13 07/21/22 0847  SpO2 99 % 07/21/22 0847  Vitals shown include unvalidated device data.  Last Pain:  Vitals:   07/21/22 0616  TempSrc:   PainSc: 0-No pain         Complications: No notable events documented.

## 2022-07-21 NOTE — Anesthesia Procedure Notes (Signed)
Procedure Name: Intubation Date/Time: 07/21/2022 7:44 AM  Performed by: Valda Favia, CRNAPre-anesthesia Checklist: Patient identified, Emergency Drugs available, Suction available and Patient being monitored Patient Re-evaluated:Patient Re-evaluated prior to induction Oxygen Delivery Method: Circle System Utilized Preoxygenation: Pre-oxygenation with 100% oxygen Induction Type: IV induction Ventilation: Mask ventilation without difficulty Laryngoscope Size: Mac and 4 Grade View: Grade I Tube type: Oral Tube size: 8.5 mm Number of attempts: 1 Airway Equipment and Method: Stylet and Oral airway Placement Confirmation: ETT inserted through vocal cords under direct vision, positive ETCO2 and breath sounds checked- equal and bilateral Secured at: 22 cm Tube secured with: Tape Dental Injury: Teeth and Oropharynx as per pre-operative assessment

## 2022-07-21 NOTE — Op Note (Signed)
Video Bronchoscopy with Robotic Assisted Bronchoscopic Navigation   Date of Operation: 07/21/2022   Pre-op Diagnosis: Right middle lobe lung nodule  Post-op Diagnosis: Right middle lobe lung nodule  Surgeon: Garner Nash, DO   Assistants: None   Anesthesia: General endotracheal anesthesia  Operation: Flexible video fiberoptic bronchoscopy with robotic assistance and biopsies.  Estimated Blood Loss: Minimal  Complications: None  Indications and History: Kyle Hamilton. is a 66 y.o. male with history of right middle lobe lung nodule. The risks, benefits, complications, treatment options and expected outcomes were discussed with the patient.  The possibilities of pneumothorax, pneumonia, reaction to medication, pulmonary aspiration, perforation of a viscus, bleeding, failure to diagnose a condition and creating a complication requiring transfusion or operation were discussed with the patient who freely signed the consent.    Description of Procedure: The patient was seen in the Preoperative Area, was examined and was deemed appropriate to proceed.  The patient was taken to [facility] endoscopy room 3, identified as Kyle Hamilton. and the procedure verified as Flexible Video Fiberoptic Bronchoscopy.  A Time Out was held and the above information confirmed.   Prior to the date of the procedure a high-resolution CT scan of the chest was performed. Utilizing ION software program a virtual tracheobronchial tree was generated to allow the creation of distinct navigation pathways to the patient's parenchymal abnormalities. After being taken to the operating room general anesthesia was initiated and the patient  was orally intubated. The video fiberoptic bronchoscope was introduced via the endotracheal tube and a general inspection was performed which showed normal right and left lung anatomy, aspiration of the bilateral mainstems was completed to remove any remaining secretions. Robotic catheter inserted  into patient's endotracheal tube.   Target #1 right middle lobe lung nodule: The distinct navigation pathways prepared prior to this procedure were then utilized to navigate to patient's lesion identified on CT scan. The robotic catheter was secured into place and the vision probe was withdrawn.  Lesion location was approximated using fluoroscopy, three-dimensional cone beam CT imaging And radial endobronchial ultrasound for peripheral targeting. Under fluoroscopic guidance transbronchial needle brushings, transbronchial needle biopsies, and transbronchial forceps biopsies were performed to be sent for cytology and pathology.  Following tissue biopsies a single fiducial was placed in proximity of the lesion using the fiducial catheter wire and delivery kit under direct fluoroscopy.  At the end of the procedure a general airway inspection was performed and there was no evidence of active bleeding. The bronchoscope was removed.  The patient tolerated the procedure well. There was no significant blood loss and there were no obvious complications. A post-procedural chest x-ray is pending.  Samples Target #1: 1. Transbronchial needle brushings from right middle lobe 2. Transbronchial Wang needle biopsies from right middle lobe 3. Transbronchial forceps biopsies from right middle lobe  Plans:  The patient will be discharged from the PACU to home when recovered from anesthesia and after chest x-ray is reviewed. We will review the cytology, pathology results with the patient when they become available. Outpatient followup will be with Garner Nash, DO.    Garner Nash, DO [facility] 07/21/2022 8:27 AM

## 2022-07-21 NOTE — Anesthesia Postprocedure Evaluation (Signed)
Anesthesia Post Note  Patient: Kyle Hamilton.  Procedure(s) Performed: ROBOTIC ASSISTED NAVIGATIONAL BRONCHOSCOPY (Right) BRONCHIAL BIOPSIES FIDUCIAL MARKER PLACEMENT BRONCHIAL NEEDLE ASPIRATION BIOPSIES BRONCHIAL BRUSHINGS     Patient location during evaluation: PACU Anesthesia Type: General Level of consciousness: awake and alert Pain management: pain level controlled Vital Signs Assessment: post-procedure vital signs reviewed and stable Respiratory status: spontaneous breathing, nonlabored ventilation, respiratory function stable and patient connected to nasal cannula oxygen Cardiovascular status: blood pressure returned to baseline and stable Postop Assessment: no apparent nausea or vomiting Anesthetic complications: no   No notable events documented.  Last Vitals:  Vitals:   07/21/22 0945 07/21/22 0956  BP: 123/79 119/72  Pulse: (!) 50 (!) 47  Resp: (!) 9 15  Temp:  36.5 C  SpO2: 94% 99%    Last Pain:  Vitals:   07/21/22 0956  TempSrc:   PainSc: 0-No pain                 March Rummage Dyamond [place]

## 2022-07-21 NOTE — Interval H&P Note (Signed)
History and Physical Interval Note:  07/21/2022 7:30 AM  Kyle Hamilton.  has presented today for surgery, with the diagnosis of lung nodule.  The various methods of treatment have been discussed with the patient and family. After consideration of risks, benefits and other options for treatment, the patient has consented to  Procedure(s) with comments: ROBOTIC ASSISTED NAVIGATIONAL BRONCHOSCOPY (Right) - ION w/ CIOS as a surgical intervention.  The patient's history has been reviewed, patient examined, no change in status, stable for surgery.  I have reviewed the patient's chart and labs.  Questions were answered to the patient's satisfaction.     [place]

## 2022-07-23 ENCOUNTER — Encounter (HOSPITAL_COMMUNITY): Payer: Self-pay | Admitting: Pulmonary Disease

## 2022-07-28 LAB — CYTOLOGY - NON PAP

## 2022-07-31 ENCOUNTER — Ambulatory Visit (INDEPENDENT_AMBULATORY_CARE_PROVIDER_SITE_OTHER): Payer: Medicare Other | Admitting: Acute Care

## 2022-07-31 ENCOUNTER — Encounter: Payer: Self-pay | Admitting: Acute Care

## 2022-07-31 VITALS — BP 124/68 | HR 70 | Temp 98.4°F | Ht 65.0 in | Wt 116.2 lb

## 2022-07-31 DIAGNOSIS — R911 Solitary pulmonary nodule: Secondary | ICD-10-CM | POA: Diagnosis not present

## 2022-07-31 DIAGNOSIS — J449 Chronic obstructive pulmonary disease, unspecified: Secondary | ICD-10-CM | POA: Diagnosis not present

## 2022-07-31 MED ORDER — BUDESONIDE-FORMOTEROL FUMARATE 160-4.5 MCG/ACT IN AERO: 2.0000 | INHALATION_SPRAY | Freq: Every evening | RESPIRATORY_TRACT | 6 refills | Status: DC

## 2022-07-31 NOTE — Progress Notes (Signed)
History of Present Illness Kyle Hamilton. is a 66 y.o. male former smoker ( Quit 2015 with a 35 pack year smoking history) with history of lung cancer  in 2015 , post SBRT, and concern for recurrence after surveillance PET scan 06/25/2022. He was referred to Dr. Valeta Harms for biopsy of nodule of concern.He underwent Flexible video fiberoptic bronchoscopy with robotic assistance and biopsies per Dr. Valeta Harms on 07/21/2022.  Synopsis This is a 66 year old gentleman, history of left-sided pneumothorax, history of prostate cancer.  Patient last seen in the office by medical oncology on 07/01/2022.  Patient diagnosed with a stage Ia, T1b, N0, M0 non-small cell lung cancer positive EGFR mutation in December 2015.  This was treated with SBRT. Pt. Also had recurrence of disease with a stage Ia in the right middle lobe.Patient had a recent nuclear medicine PET scan on 06/25/2022 with an enlarging spiculated right middle lobe lung nodule concerning for malignancy. patient was referred by medical oncology for consideration of repeat biopsy and tissue diagnosis to confirm recurrence of disease   07/31/2022 Pt. Presents for follow up after Flexible video fiberoptic bronchoscopy with robotic assistance and biopsies per Dr. Valeta Harms on 07/21/2022. He states he has done well after the procedure. He states he did have some scant bleeding after the procedure. This stopped 1 day after the procedure. He had a sore throat for 2 days which has also self resolved . He does have some shortness of breath with exertion. He is compliant with Symbicort daily. I have renewed his prescriptions for 6 months.  Biopsies resulted as no malignant cells.I have spoken with Dr. Valeta Harms, and he definitely feels he got the nodule.  Plan is for 3 month follow up CT chest to re-evaluate the nodule of concern with OV follow up to review results. Continue follow up with medical oncology.Pt. Is in agreement with this plan.   Test Results: Cytology  07/21/2022 FINAL MICROSCOPIC DIAGNOSIS:   A. LUNG, RUL MASS, NEEDLE  BIOPSY FORCEPS:  - No malignant cells identified, see comment   B. LUNG, RUL MASS, BRUSHING:  - No malignant cells identified, see comment   06/12/2022: Enlarging spiculated nodule in the right upper lobe concerning for metachronous primary.     Latest Ref Rng & Units 07/21/2022    5:47 AM 06/15/2022   11:51 AM 06/16/2021    2:04 PM  CBC  WBC 4.0 - 10.5 K/uL 5.4  5.9  5.7   Hemoglobin 13.0 - 17.0 g/dL 14.6  14.4  15.6   Hematocrit 39.0 - 52.0 % 45.6  43.3  47.0   Platelets 150 - 400 K/uL 268  332  277        Latest Ref Rng & Units 07/21/2022    5:47 AM 06/15/2022   11:51 AM 06/16/2021    2:04 PM  BMP  Glucose 70 - 99 mg/dL 95  90  83   BUN 8 - 23 mg/dL _0 Creatinine 0.61 - 1.24 mg/dL 0.80  0.63  0.67   Sodium 135 - 145 mmol/L 137  138  141   Potassium 3.5 - 5.1 mmol/L 3.4  3.9  3.8   Chloride 98 - 111 mmol/L 100  101  103   CO2 22 - 32 mmol/L 30  34  29   Calcium 8.9 - 10.3 mg/dL 9.1  9.2  9.8     BNP No results found for: "BNP"  ProBNP No results found for: "PROBNP"  PFT  Component Value Date/Time   FEV1PRE 1.25 09/26/2014 0841   FEV1POST 1.67 09/26/2014 0841   FVCPRE 3.06 09/26/2014 0841   FVCPOST 3.56 09/26/2014 0841   TLC 7.51 09/26/2014 0841   DLCOUNC 20.25 09/26/2014 0841   PREFEV1FVCRT 41 09/26/2014 0841   PSTFEV1FVCRT 47 09/26/2014 0841    DG Chest Port 1 View  Result Date: 07/21/2022 CLINICAL DATA:  Status post bronchoscopy EXAM: PORTABLE CHEST 1 VIEW COMPARISON:  Radiograph 09/20/2019, chest CT 06/12/2022 FINDINGS: Cardiomediastinal silhouette is within normal limits. There are postsurgical changes in the left mid lung. Post biopsy changes in the right mid lung with biopsy marker and focal opacity noted. There is no pleural effusion. No evidence of pneumothorax. No acute osseous abnormality. Thoracic spondylosis. Unchanged chronic left lateral fifth rib injury. Bilateral shoulder  degenerative changes. IMPRESSION: Post biopsy changes in the right mid lung. No evidence of pneumothorax. Electronically Signed   By: Maurine Simmering M.D.   On: 07/21/2022 09:32   DG C-ARM BRONCHOSCOPY  Result Date: 07/21/2022 C-ARM BRONCHOSCOPY: Fluoroscopy was utilized by the requesting physician.  No radiographic interpretation.     Past medical hx Past Medical History:  Diagnosis Date   Chronic mastoiditis, bilateral    COPD (chronic obstructive pulmonary disease) (HCC)    Hearing loss    Hypertension    Lung cancer ([place])    Pneumothorax, left    History of post op   Prostate cancer ([place])    Shortness of breath dyspnea    on exertion   Spinal stenosis      Social History   Tobacco Use   Smoking status: Former    Packs/day: 1.00    Years: 35.00    Total pack years: 35.00    Types: Cigarettes    Quit date: 05/11/2014    Years since quitting: 8.2   Smokeless tobacco: Never  Vaping Use   Vaping Use: Never used  Substance Use Topics   Alcohol use: No   Drug use: No    [name] reports that he quit smoking about 8 years ago. His smoking use included cigarettes. He has a 35.00 pack-year smoking history. He has never used smokeless tobacco. He reports that he does not drink alcohol and does not use drugs.  Tobacco Cessation: Former smoker , quit in 2015 with a 35 pack year smoking history   Past surgical hx, Family hx, Social hx all reviewed.  Current Outpatient Medications on File Prior to Visit  Medication Sig   hydrochlorothiazide (MICROZIDE) 12.5 MG capsule Take 12.5 mg by mouth daily.   No current facility-administered medications on file prior to visit.     No Known Allergies  Review Of Systems:  Constitutional:   No  weight loss, night sweats,  Fevers, chills, fatigue, or  lassitude.  HEENT:   No headaches,  Difficulty swallowing,  Tooth/dental problems, or  Sore throat,                No sneezing, itching, ear ache, nasal congestion, post nasal drip,    CV:  No chest pain,  Orthopnea, PND, swelling in lower extremities, anasarca, dizziness, palpitations, syncope.   GI  No heartburn, indigestion, abdominal pain, nausea, vomiting, diarrhea, change in bowel habits, loss of appetite, bloody stools.   Resp: + shortness of breath with exertion less  at rest.  No excess mucus, no productive cough,  No non-productive cough,  No coughing up of blood.  No change in color of mucus.  + wheezing.  No  chest wall deformity  Skin: no rash or lesions.  GU: no dysuria, change in color of urine, no urgency or frequency.  No flank pain, no hematuria   MS:  No joint pain or swelling.  No decreased range of motion.  No back pain.  Psych:  No change in mood or affect. No depression or anxiety.  No memory loss.   Vital Signs BP 124/68 (BP Location: Left Arm, Patient Position: Sitting, Cuff Size: Normal)   Pulse 70   Temp 98.4 F (36.9 C) (Oral)   Ht _0  (1.651 m)   Wt 116 lb 3.2 oz (52.7 kg)   SpO2 94%   BMI 19.34 kg/m    Physical Exam:  General- No distress,  A&Ox3, pleasant ENT: No sinus tenderness, TM clear, pale nasal mucosa, no oral exudate,no post nasal drip, no LAN Cardiac: S1, S2, regular rate and rhythm, no murmur Chest: No wheeze/ rales/ dullness; no accessory muscle use, no nasal flaring, no sternal retractions Abd.: Soft Non-tender, Nd, BS +, Body mass index is 19.34 kg/m.  Ext: No clubbing cyanosis, edema Neuro:  normal strength, MAE x 4, A&O x 3 Skin: No rashes, warm and dry, no lesions  Psych: normal mood and behavior   Assessment/Plan Enlarging spiculated right middle lobe lung nodule concerning for metachronous primary in patient with history of lung cancer with recurrence x 1. Biopsy resulted as no malignant cells Plan Your biopsies were negative for malignancy Plan will be to continue imaging surveillance, and continued follow up with medical oncology.. We will do a 3 month follow up CT Chest to ensure the nodule of  concern is stable.  Follow up after CT Chest to review the results ( This will be 10/2022)   COPD Stable interval Plan We will send a prescription  for your Symbicort  2 puffs twice daily every day.  Rinse mouth after use.  Please contact office for sooner follow up if symptoms do not improve or worsen or seek emergency care    I spent 35 minutes dedicated to the care of this patient on the date of this encounter to include pre-visit review of records, face-to-face time with the patient discussing conditions above, post visit ordering of testing, clinical documentation with the electronic health record, making appropriate referrals as documented, and communicating necessary information to the patient's healthcare team.   Magdalen Spatz, NP 07/31/2022  1:31 PM

## 2022-07-31 NOTE — Patient Instructions (Addendum)
It is good to see you today. Your biopsies were negative for malignancy Plan will be to continue imaging surveillance.. We will do a 3 month follow up Ct Chest to ensure the nodule of concern is stable.  Follow up after CT Chest to review the results ( This will be 10/2022)  We will send a prescription  for your Symbicort  2 puffs twice daily every day.  Rinse mouth after use.  Please contact office for sooner follow up if symptoms do not improve or worsen or seek emergency care

## 2022-08-04 NOTE — Progress Notes (Unsigned)
[facility] OFFICE PROGRESS NOTE  [address], [place] [address]  DIAGNOSIS:  1) stage Ia (T1b, N0, M0) non-small cell lung cancer, adenocarcinoma with positive EGFR mutation in exon 21 ([id number]) diagnosed in December 2015. 2) Recurrent disease presenting as stage Ia involving the right middle lobe.  PRIOR THERAPY: 1)  status post left lower lobe wedge resection under the care of Dr. Darcey Nora. 2)  status post SBRT in 2020 under the care of Dr. Tammi Klippel.   CURRENT THERAPY: Observation  INTERVAL HISTORY: [name]. 66 y.o. male returns to the clinic today for a follow-up visit.  The patient saw Dr. Julien Nordmann recently for an annual 1 year follow-up on 06/17/2022.  At that point time, his CT scan showed slight interval increase in the size and spiculation of a spiculated nodule within the right upper lobe adjacent to the small pneumatocele, concerning for development of metachronous bronchogenic carcinoma.  Therefore, Dr. Julien Nordmann recommended a PET scan to further evaluate this. His PET scan showed *** low-level hypermetabolic activity which is suspicious for local recurrence of lung cancer (presumed adenocarcinoma based on the low-level activity). Therefore, the patient then saw Dr. Valeta Harms on 07/21/22 for bronchoscopy and biopsy. Dr. Valeta Harms felt he had adequate samples. His final pathology was negative for malignancy. Therefore, Dr. Valeta Harms recommended follow up in 3 months with CT.   Since last being seen by myself on 07/01/22, the patient denies changes in his health. He has a decreased appetite due to the stress of recently putting his mother in a nursing home.  Denies any fever, chills, or night sweats.  Denies any chest pain, cough, or hemoptysis.  He sometimes may have shortness of breath with the heat outside.  Denies any nausea, vomiting, diarrhea, or constipation. Denies any headache or visual changes.  For the patient's initial tumor in 2015, he was  positive for EGFR mutation.  He is here today for evaluation and more detailed discussion about his current condition.  MEDICAL HISTORY: Past Medical History:  Diagnosis Date   Chronic mastoiditis, bilateral    COPD (chronic obstructive pulmonary disease) (HCC)    Hearing loss    Hypertension    Lung cancer ([place])    Pneumothorax, left    History of post op   Prostate cancer ([place])    Shortness of breath dyspnea    on exertion   Spinal stenosis     ALLERGIES:  has No Known Allergies.  MEDICATIONS:  Current Outpatient Medications  Medication Sig Dispense Refill   budesonide-formoterol (SYMBICORT) 160-4.5 MCG/ACT inhaler Inhale 2 puffs into the lungs every evening. 1 each 6   hydrochlorothiazide (MICROZIDE) 12.5 MG capsule Take 12.5 mg by mouth daily.  1   No current facility-administered medications for this visit.    SURGICAL HISTORY:  Past Surgical History:  Procedure Laterality Date   BAHA REVISION Right 08/30/2014   Procedure: BONE ANCHORED HEARING AID (BAHA) REVISION RIGHT SIDE;  Surgeon: Izora Gala, MD;  Location: [facility];  Service: ENT;  Laterality: Right;   BONE ANCHORED HEARING AID IMPLANT  08/03/2012   Procedure: BONE ANCHORED HEARING AID (BAHA) IMPLANT;  Surgeon: Izora Gala, MD;  Location: [facility];  Service: ENT;  Laterality: Right;   BRONCHIAL BIOPSY  07/21/2022   Procedure: BRONCHIAL BIOPSIES;  Surgeon: Garner Nash, DO;  Location: [facility];  Service: Pulmonary;;   BRONCHIAL BRUSHINGS  07/21/2022   Procedure: BRONCHIAL BRUSHINGS;  Surgeon: Garner Nash, DO;  Location: [facility]  ENDOSCOPY;  Service: Pulmonary;;   BRONCHIAL NEEDLE ASPIRATION BIOPSY  07/21/2022   Procedure: BRONCHIAL NEEDLE ASPIRATION BIOPSIES;  Surgeon: Garner Nash, DO;  Location: [place];  Service: Pulmonary;;   COLONOSCOPY  2007   CYSTOSCOPY  03/25/2018   Procedure: CYSTOSCOPY;  Surgeon: Kathie Rhodes, MD;  Location: [facility];  Service: Urology;;  no seeds noted in  bladder   FIDUCIAL MARKER PLACEMENT  07/21/2022   Procedure: FIDUCIAL MARKER PLACEMENT;  Surgeon: Garner Nash, DO;  Location: [facility];  Service: Pulmonary;;   LEG SURGERY     right leg deep cut   MOUTH SURGERY     PROSTATE BIOPSY     RADIOACTIVE SEED IMPLANT N/A 03/25/2018   Procedure: RADIOACTIVE SEED IMPLANT/BRACHYTHERAPY IMPLANT;  Surgeon: Kathie Rhodes, MD;  Location: [facility];  Service: Urology;  Laterality: N/A;  71 seeds implanted   SPACE OAR INSTILLATION N/A 03/25/2018   Procedure: SPACE OAR INSTILLATION;  Surgeon: Kathie Rhodes, MD;  Location[facility];  Service: Urology;  Laterality: N/A;   STAPEDES SURGERY     bilateral   VIDEO ASSISTED THORACOSCOPY (VATS)/ LOBECTOMY Left 10/09/2014   Procedure: VIDEO ASSISTED THORACOSCOPY (VATS)/ LOBECTOMY;  Surgeon: Ivin Poot, MD;  Location[facility];  Service: Thoracic;  Laterality: Left;    REVIEW OF SYSTEMS:   Review of Systems  Constitutional: Negative for appetite change, chills, fatigue, fever and unexpected weight change.  HENT:   Negative for mouth sores, nosebleeds, sore throat and trouble swallowing.   Eyes: Negative for eye problems and icterus.  Respiratory: Negative for cough, hemoptysis, shortness of breath and wheezing.   Cardiovascular: Negative for chest pain and leg swelling.  Gastrointestinal: Negative for abdominal pain, constipation, diarrhea, nausea and vomiting.  Genitourinary: Negative for bladder incontinence, difficulty urinating, dysuria, frequency and hematuria.   Musculoskeletal: Negative for back pain, gait problem, neck pain and neck stiffness.  Skin: Negative for itching and rash.  Neurological: Negative for dizziness, extremity weakness, gait problem, headaches, light-headedness and seizures.  Hematological: Negative for adenopathy. Does not bruise/bleed easily.  Psychiatric/Behavioral: Negative for confusion, depression and sleep disturbance. The patient is not  nervous/anxious.     PHYSICAL EXAMINATION:  There were no vitals taken for this visit.  ECOG PERFORMANCE STATUS: {CHL ONC ECOG [id number]  Physical Exam  Constitutional: Oriented to person, place, and time and well-developed, well-nourished, and in no distress. No distress.  HENT:  Head: Normocephalic and atraumatic.  Mouth/Throat: Oropharynx is clear and moist. No oropharyngeal exudate.  Eyes: Conjunctivae are normal. Right eye exhibits no discharge. Left eye exhibits no discharge. No scleral icterus.  Neck: Normal range of motion. Neck supple.  Cardiovascular: Normal rate, regular rhythm, normal heart sounds and intact distal pulses.   Pulmonary/Chest: Effort normal and breath sounds normal. No respiratory distress. No wheezes. No rales.  Abdominal: Soft. Bowel sounds are normal. Exhibits no distension and no mass. There is no tenderness.  Musculoskeletal: Normal range of motion. Exhibits no edema.  Lymphadenopathy:    No cervical adenopathy.  Neurological: Alert and oriented to person, place, and time. Exhibits normal muscle tone. Gait normal. Coordination normal.  Skin: Skin is warm and dry. No rash noted. Not diaphoretic. No erythema. No pallor.  Psychiatric: Mood, memory and judgment normal.  Vitals reviewed.  LABORATORY DATA: Lab Results  Component Value Date   WBC 5.4 07/21/2022   HGB 14.6 07/21/2022   HCT 45.6 07/21/2022   MCV 92.3 07/21/2022   PLT 268 07/21/2022  Chemistry      Component Value Date/Time   NA 137 07/21/2022 0547   K 3.4 (L) 07/21/2022 0547   CL 100 07/21/2022 0547   CO2 30 07/21/2022 0547   BUN 10 07/21/2022 0547   CREATININE 0.80 07/21/2022 0547   CREATININE 0.63 06/15/2022 1151   CREATININE 0.71 11/24/2019 1327      Component Value Date/Time   CALCIUM 9.1 07/21/2022 0547   ALKPHOS 73 06/15/2022 1151   AST 14 (L) 06/15/2022 1151   ALT 9 06/15/2022 1151   BILITOT 0.5 06/15/2022 1151       RADIOGRAPHIC STUDIES:  DG Chest  Port 1 View  Result Date: 07/21/2022 CLINICAL DATA:  Status post bronchoscopy EXAM: PORTABLE CHEST 1 VIEW COMPARISON:  Radiograph 09/20/2019, chest CT 06/12/2022 FINDINGS: Cardiomediastinal silhouette is within normal limits. There are postsurgical changes in the left mid lung. Post biopsy changes in the right mid lung with biopsy marker and focal opacity noted. There is no pleural effusion. No evidence of pneumothorax. No acute osseous abnormality. Thoracic spondylosis. Unchanged chronic left lateral fifth rib injury. Bilateral shoulder degenerative changes. IMPRESSION: Post biopsy changes in the right mid lung. No evidence of pneumothorax. Electronically Signed   By: Maurine Simmering M.D.   On: 07/21/2022 09:32   DG C-ARM BRONCHOSCOPY  Result Date: 07/21/2022 C-ARM BRONCHOSCOPY: Fluoroscopy was utilized by the requesting physician.  No radiographic interpretation.     ASSESSMENT/PLAN:  This is a very pleasant 66 year old Caucasian male with a history of stage Ia (T1b, N0, M0) non-small cell lung cancer, adenocarcinoma.  He presented with a positive EGFR mutation exon 21 (L861Q.).  This was diagnosed in December 2015.  He is status post left lower lobe wedge resection under the care of Dr. Darcey Nora.   He then had recurrent disease presenting with a stage Ia right middle lobe lesion status post SBRT in 2020 under the care of Dr. Tammi Klippel.   More recently, the patient had a restaging CT scan which showed interval increase in size of a spiculated right upper lobe lung nodule adjacent to a small pneumatocele.  This is concerning for metachronous bronchogenic carcinoma.   Therefore, the patient had a PET scan performed to further evaluate this.  The patient was seen with Dr. Julien Nordmann today.  Dr. Julien Nordmann had a lengthy discussion today with the patient about his current condition and recommended treatment options.  The PET scan showed that the enlarging spiculated right middle lobe nodule has a low-level  hypermetabolic activity which is suspicious for local recurrence of lung cancer (presumed adenocarcinoma based on the low-level activity).   The patient underwent bronchoscopy and biopsy on 07/21/22 which was negative for malignancy.   The patient was seen with Dr. Julien Nordmann. Dr. Julien Nordmann reviewed the plan with the patient. Dr. Julien Nordmann recommends repeat CT of the chest in 3 months.   We will see the patient back a few days later for evaluation and to review the results.  If found to be malignant, Dr. Julien Nordmann would likely recommend SBRT to this area.***  The patient was advised to call immediately if she has any concerning symptoms in the interval. The patient voices understanding of current disease status and treatment options and is in agreement with the current care plan. All questions were answered. The patient knows to call the clinic with any problems, questions or concerns. We can certainly see the patient much sooner if necessary          No orders of the defined types  were placed in this encounter.    I spent {CHL ONC TIME VISIT - LKHVF:[id number]} counseling the patient face to face. The total time spent in the appointment was {CHL ONC TIME VISIT - KRCVK:[id number]}.  Johnnette Laux L Hurbert Duran, PA-C 08/04/22

## 2022-08-06 ENCOUNTER — Inpatient Hospital Stay: Payer: Medicare Other | Attending: Internal Medicine | Admitting: Physician Assistant

## 2022-08-06 ENCOUNTER — Other Ambulatory Visit: Payer: Self-pay

## 2022-08-06 ENCOUNTER — Encounter: Payer: Self-pay | Admitting: Physician Assistant

## 2022-08-06 VITALS — BP 154/93 | HR 74 | Temp 97.5°F | Resp 17 | Wt 114.0 lb

## 2022-08-06 DIAGNOSIS — C342 Malignant neoplasm of middle lobe, bronchus or lung: Secondary | ICD-10-CM | POA: Diagnosis not present

## 2022-08-06 DIAGNOSIS — R918 Other nonspecific abnormal finding of lung field: Secondary | ICD-10-CM | POA: Diagnosis not present

## 2022-10-19 ENCOUNTER — Ambulatory Visit (HOSPITAL_COMMUNITY): Admission: RE | Admit: 2022-10-19 | Payer: Medicare Other | Source: Ambulatory Visit

## 2022-10-23 ENCOUNTER — Encounter: Payer: Self-pay | Admitting: Acute Care

## 2022-10-23 ENCOUNTER — Ambulatory Visit (INDEPENDENT_AMBULATORY_CARE_PROVIDER_SITE_OTHER): Payer: Medicare Other | Admitting: Acute Care

## 2022-10-23 VITALS — BP 120/90 | HR 85 | Ht 65.0 in | Wt 118.0 lb

## 2022-10-23 DIAGNOSIS — J449 Chronic obstructive pulmonary disease, unspecified: Secondary | ICD-10-CM

## 2022-10-23 DIAGNOSIS — R911 Solitary pulmonary nodule: Secondary | ICD-10-CM

## 2022-10-23 MED ORDER — ALBUTEROL SULFATE HFA 108 (90 BASE) MCG/ACT IN AERS: 2.0000 | INHALATION_SPRAY | Freq: Four times a day (QID) | RESPIRATORY_TRACT | 3 refills | Status: DC | PRN

## 2022-10-23 MED ORDER — BREZTRI AEROSPHERE 160-9-4.8 MCG/ACT IN AERO: 2.0000 | INHALATION_SPRAY | Freq: Two times a day (BID) | RESPIRATORY_TRACT | 2 refills | Status: DC

## 2022-10-23 NOTE — Patient Instructions (Addendum)
It is good to see you today. We will need to get you over to get your CT Scan of the chest to re-evaluate the nodule of concern to make sure it has been stable . You are scheduled at [facility] at 8 pm on Monday 10/26/2022 , arrive at 7:45 pm  to get registered. Check in through the Emergency department. Let them know you have an outpatient CT Chest.  We need to schedule you for follow up after this scan so we can review the results with you.  We will do a therapeutic trial with Breztri , this inhaler has 3 of the medications for your COPD, instead of just 2 which Symbicort contains.  Take 2 puffs twice daily , rinse mouth after use . We will see if you like this when we do you post Ct Chest follow up 10/27/2022 Do not use Symbicort while using the Breztri.  Follow up 10/27/2022 at 2:30 pm with Judson Roch NP to review results We will prescribe an albuterol inhaler to be used as a rescue inhaler.  Use this when you have breakthrough shortness of breath. Take 2 puffs as needed for shortness of breath or wheezing.  Do not use more than 3 times daily Follow up 10/27/2022 at 2:30 with Judson Roch NP Call if you need [place] sooner.  Please contact office for sooner follow up if symptoms do not improve or worsen or seek emergency care

## 2022-10-23 NOTE — Progress Notes (Unsigned)
History of Present Illness Kyle Hamilton. is a 66 y.o. male with ***  Hx. Of Stage Ia (T1b, N0, M0) non-small cell lung cancer, adenocarcinoma with positive EGFR mutation in exon 21 ([id number]) diagnosed in December 2015. Recurrent disease presenting as stage Ia involving the right middle lobe.   PRIOR THERAPY: 1)  status post left lower lobe wedge resection under the care of Dr. Darcey Nora. 2)  status post SBRT in 2020 under the care of Dr. Tammi Klippel.  Surveillance CT Chest 06/17/2022 showed slight interval increase in the size and spiculation of a  nodule within the right upper lobe adjacent to the small pneumatocele, concerning for development of metachronous bronchogenic carcinoma.  Therefore, Dr. Julien Nordmann recommended a PET scan to further evaluate this. His PET scan showed low-level hypermetabolic activity which is suspicious for local recurrence of lung cancer (presumed adenocarcinoma based on the low-level activity). Therefore, the patient then saw Dr. Valeta Harms on 07/21/22 for bronchoscopy and biopsy. Dr. Valeta Harms felt he had adequate samples. His final pathology was negative for malignancy. Therefore, Dr. Valeta Harms recommended follow up in 3 months with CT. This has been ordered and scheduled for 10/19/22. He has a follow up a few days later with the pulmonary medicine office on 12/15 to review the results.      10/23/2022 Pt. Presents for follow up today. He was scheduled for his 3 month follow up Ct Chest 10/19/2022. He missed this appointment. He has not had his follow up scan. He thinks he may have forgotten about it, and stated he usually gets a call to remind him. He states he has been doing well from a pulmonary perspective. He has difficulty walking long distances   Test Results:     Latest Ref Rng & Units 07/21/2022    5:47 AM 06/15/2022   11:51 AM 06/16/2021    2:04 PM  CBC  WBC 4.0 - 10.5 K/uL 5.4  5.9  5.7   Hemoglobin 13.0 - 17.0 g/dL 14.6  14.4  15.6   Hematocrit 39.0 - 52.0 % 45.6  43.3   47.0   Platelets 150 - 400 K/uL 268  332  277        Latest Ref Rng & Units 07/21/2022    5:47 AM 06/15/2022   11:51 AM 06/16/2021    2:04 PM  BMP  Glucose 70 - 99 mg/dL 95  90  83   BUN 8 - 23 mg/dL _0 Creatinine 0.61 - 1.24 mg/dL 0.80  0.63  0.67   Sodium 135 - 145 mmol/L 137  138  141   Potassium 3.5 - 5.1 mmol/L 3.4  3.9  3.8   Chloride 98 - 111 mmol/L 100  101  103   CO2 22 - 32 mmol/L 30  34  29   Calcium 8.9 - 10.3 mg/dL 9.1  9.2  9.8     BNP No results found for: "BNP"  ProBNP No results found for: "PROBNP"  PFT    Component Value Date/Time   FEV1PRE 1.25 09/26/2014 0841   FEV1POST 1.67 09/26/2014 0841   FVCPRE 3.06 09/26/2014 0841   FVCPOST 3.56 09/26/2014 0841   TLC 7.51 09/26/2014 0841   DLCOUNC 20.25 09/26/2014 0841   PREFEV1FVCRT 41 09/26/2014 0841   PSTFEV1FVCRT 47 09/26/2014 0841    No results found.   Past medical hx Past Medical History:  Diagnosis Date   Chronic mastoiditis, bilateral    COPD (chronic obstructive pulmonary disease) ([place])  Hearing loss    Hypertension    Lung cancer ([place])    Pneumothorax, left    History of post op   Prostate cancer ([place])    Shortness of breath dyspnea    on exertion   Spinal stenosis      Social History   Tobacco Use   Smoking status: Former    Packs/day: 1.00    Years: 35.00    Total pack years: 35.00    Types: Cigarettes    Quit date: 05/11/2014    Years since quitting: 8.4   Smokeless tobacco: Never  Vaping Use   Vaping Use: Never used  Substance Use Topics   Alcohol use: No   Drug use: No    Mr.Fedder reports that he quit smoking about 8 years ago. His smoking use included cigarettes. He has a 35.00 pack-year smoking history. He has never used smokeless tobacco. He reports that he does not drink alcohol and does not use drugs.  Tobacco Cessation: Counseling given: Not Answered   Past surgical hx, Family hx, Social hx all reviewed.  Current Outpatient Medications on File  Prior to Visit  Medication Sig   budesonide-formoterol (SYMBICORT) 160-4.5 MCG/ACT inhaler Inhale 2 puffs into the lungs every evening.   hydrochlorothiazide (MICROZIDE) 12.5 MG capsule Take 12.5 mg by mouth daily.   No current facility-administered medications on file prior to visit.     No Known Allergies  Review Of Systems:  Constitutional:   No  weight loss, night sweats,  Fevers, chills, fatigue, or  lassitude.  HEENT:   No headaches,  Difficulty swallowing,  Tooth/dental problems, or  Sore throat,                No sneezing, itching, ear ache, nasal congestion, post nasal drip,   CV:  No chest pain,  Orthopnea, PND, swelling in lower extremities, anasarca, dizziness, palpitations, syncope.   GI  No heartburn, indigestion, abdominal pain, nausea, vomiting, diarrhea, change in bowel habits, loss of appetite, bloody stools.   Resp: No shortness of breath with exertion or at rest.  No excess mucus, no productive cough,  No non-productive cough,  No coughing up of blood.  No change in color of mucus.  No wheezing.  No chest wall deformity  Skin: no rash or lesions.  GU: no dysuria, change in color of urine, no urgency or frequency.  No flank pain, no hematuria   MS:  No joint pain or swelling.  No decreased range of motion.  No back pain.  Psych:  No change in mood or affect. No depression or anxiety.  No memory loss.   Vital Signs BP (!) 120/90 (BP Location: Left Arm)   Pulse 85   Ht _0  (1.651 m)   Wt 118 lb (53.5 kg)   SpO2 92%   BMI 19.64 kg/m    Physical Exam:  General- No distress,  A&Ox3 ENT: No sinus tenderness, TM clear, pale nasal mucosa, no oral exudate,no post nasal drip, no LAN Cardiac: S1, S2, regular rate and rhythm, no murmur Chest: No wheeze/ rales/ dullness; no accessory muscle use, no nasal flaring, no sternal retractions Abd.: Soft Non-tender Ext: No clubbing cyanosis, edema Neuro:  normal strength Skin: No rashes, warm and dry Psych: normal  mood and behavior   Assessment/Plan  No problem-specific Assessment & Plan notes found for this encounter.    Magdalen Spatz, NP 10/23/2022  10:01 AM

## 2022-10-26 ENCOUNTER — Ambulatory Visit (HOSPITAL_COMMUNITY)
Admission: RE | Admit: 2022-10-26 | Discharge: 2022-10-26 | Disposition: A | Discharge status: Home / Self Care | Payer: Medicare Other | Source: Ambulatory Visit | Attending: Acute Care | Admitting: Acute Care

## 2022-10-26 ENCOUNTER — Encounter: Payer: Self-pay | Admitting: Acute Care

## 2022-10-26 DIAGNOSIS — R911 Solitary pulmonary nodule: Secondary | ICD-10-CM | POA: Insufficient documentation

## 2022-10-26 DIAGNOSIS — J449 Chronic obstructive pulmonary disease, unspecified: Secondary | ICD-10-CM | POA: Insufficient documentation

## 2022-10-26 DIAGNOSIS — R918 Other nonspecific abnormal finding of lung field: Secondary | ICD-10-CM | POA: Diagnosis not present

## 2022-10-26 DIAGNOSIS — J439 Emphysema, unspecified: Secondary | ICD-10-CM | POA: Diagnosis not present

## 2022-10-27 ENCOUNTER — Ambulatory Visit (INDEPENDENT_AMBULATORY_CARE_PROVIDER_SITE_OTHER): Payer: Medicare Other | Admitting: Acute Care

## 2022-10-27 ENCOUNTER — Encounter: Payer: Self-pay | Admitting: Acute Care

## 2022-10-27 VITALS — BP 112/72 | HR 93 | Temp 97.6°F | Ht 65.0 in | Wt 115.2 lb

## 2022-10-27 DIAGNOSIS — R911 Solitary pulmonary nodule: Secondary | ICD-10-CM

## 2022-10-27 DIAGNOSIS — Z85118 Personal history of other malignant neoplasm of bronchus and lung: Secondary | ICD-10-CM | POA: Diagnosis not present

## 2022-10-27 DIAGNOSIS — Z87891 Personal history of nicotine dependence: Secondary | ICD-10-CM

## 2022-10-27 NOTE — H&P (View-Only) (Signed)
History of Present Illness Kyle Hamilton. is a 66 y.o. male former smoker ( Quit 2015 with a 35 pack year smoking history) with history of lung cancer  in 2015 , post SBRT, and concern for recurrence after surveillance PET scan 06/25/2022. He was referred to Dr. Valeta Harms for biopsy of nodule of concern.He underwent Flexible video fiberoptic bronchoscopy with robotic assistance and biopsies per Dr. Valeta Harms on 07/21/2022 which was negative for malignancy.Plan is for continued imaging surveillance.   Synopsis This is a 66 year old gentleman, history of left-sided pneumothorax, history of prostate cancer.  Patient last seen in the office by medical oncology on 07/01/2022.  Patient diagnosed with a stage Ia, T1b, N0, M0 non-small cell lung cancer positive EGFR mutation in December 2015.  This was treated with SBRT. Pt. Also had recurrence of disease with a stage Ia in the right middle lobe.Patient had a recent nuclear medicine PET scan on 06/25/2022 with an enlarging spiculated right middle lobe lung nodule concerning for malignancy. patient was referred by medical oncology for consideration of repeat biopsy and tissue diagnosis to confirm recurrence of disease.    10/27/2022 Pt. Presents for follow up after 3 month CT Surveillance of RML lung nodule. This nodule was biopsied 07/21/2022 and negative for malignancy. 3 Month follow up scan is concerning for malignancy. I have messaged Dr. Valeta Harms regarding best options moving forward.  He has not started his Breztri yet, but plans to start it tomorrow morning. He knows to stop using his Symbicort while using the [place].     Test Results: CT Chest without Contrast 10/26/2022 11 x 10 mm nodule in the posterior right middle lobe previously is 11 x 10 mm again today on image 76/5. This is immediately adjacent to a air cyst and there is new soft tissue thickening along the lateral wall the air cyst, extending posteriorly from the spiculated nodule (compare image  75/5 today to image 73/5 previously). Post treatment changes in the parahilar left lung are stable. No new suspicious pulmonary nodule or mass. No focal airspace consolidation. No pleural effusion.  Continued progression of the 11 x 10 mm posterior right middle lobe pulmonary nodule. There is new soft tissue thickening along the lateral wall an immediately adjacent air cyst, extending posteriorly from the spiculated nodule. Neoplasm remains a concern. 2. Stable post treatment changes in the parahilar left lung. 3.  Emphysema (ICD10-J43.9) and Aortic Atherosclerosis (ICD10-170.0)  07/21/2022 Cytology Biopsy RUL mass A. LUNG, RUL MASS, NEEDLE  BIOPSY FORCEPS:  - No malignant cells identified, see comment   B. LUNG, RUL MASS, BRUSHING:  - No malignant cells identified, see comment      Latest Ref Rng & Units 07/21/2022    5:47 AM 06/15/2022   11:51 AM 06/16/2021    2:04 PM  CBC  WBC 4.0 - 10.5 K/uL 5.4  5.9  5.7   Hemoglobin 13.0 - 17.0 g/dL 14.6  14.4  15.6   Hematocrit 39.0 - 52.0 % 45.6  43.3  47.0   Platelets 150 - 400 K/uL 268  332  277        Latest Ref Rng & Units 07/21/2022    5:47 AM 06/15/2022   11:51 AM 06/16/2021    2:04 PM  BMP  Glucose 70 - 99 mg/dL 95  90  83   BUN 8 - 23 mg/dL _0 Creatinine 0.61 - 1.24 mg/dL 0.80  0.63  0.67   Sodium 135 - 145  mmol/L 137  138  141   Potassium 3.5 - 5.1 mmol/L 3.4  3.9  3.8   Chloride 98 - 111 mmol/L 100  101  103   CO2 22 - 32 mmol/L 30  34  29   Calcium 8.9 - 10.3 mg/dL 9.1  9.2  9.8     BNP No results found for: "BNP"  ProBNP No results found for: "PROBNP"  PFT    Component Value Date/Time   FEV1PRE 1.25 09/26/2014 0841   FEV1POST 1.67 09/26/2014 0841   FVCPRE 3.06 09/26/2014 0841   FVCPOST 3.56 09/26/2014 0841   TLC 7.51 09/26/2014 0841   DLCOUNC 20.25 09/26/2014 0841   PREFEV1FVCRT 41 09/26/2014 0841   PSTFEV1FVCRT 47 09/26/2014 0841    CT CHEST WO CONTRAST  Result Date: 10/27/2022 CLINICAL DATA:   Pulmonary nodule. EXAM: CT CHEST WITHOUT CONTRAST TECHNIQUE: Multidetector CT imaging of the chest was performed following the standard protocol without IV contrast. RADIATION DOSE REDUCTION: This exam was performed according to the departmental dose-optimization program which includes automated exposure control, adjustment of the mA and/or kV according to patient size and/or use of iterative reconstruction technique. COMPARISON:  06/12/2022 FINDINGS: Cardiovascular: The heart size is normal. No substantial pericardial effusion. Coronary artery calcification is evident. Mild atherosclerotic calcification is noted in the wall of the thoracic aorta. Mediastinum/Nodes: No mediastinal lymphadenopathy. No evidence for gross hilar lymphadenopathy although assessment is limited by the lack of intravenous contrast on the current study. Dense calcification again noted in the mid thoracic esophagus. There is no axillary lymphadenopathy. Lungs/Pleura: Centrilobular emphsyema noted. 11 x 10 mm nodule in the posterior right middle lobe previously is 11 x 10 mm again today on image 76/5. This is immediately adjacent to a air cyst and there is new soft tissue thickening along the lateral wall the air cyst, extending posteriorly from the spiculated nodule (compare image 75/5 today to image 73/5 previously). Post treatment changes in the parahilar left lung are stable. No new suspicious pulmonary nodule or mass. No focal airspace consolidation. No pleural effusion. Upper Abdomen: Stable tiny hypodensities in the liver parenchyma are compatible with cyst. No followup imaging is recommended. Musculoskeletal: No worrisome lytic or sclerotic osseous abnormality. IMPRESSION: 1. Continued progression of the 11 x 10 mm posterior right middle lobe pulmonary nodule. There is new soft tissue thickening along the lateral wall an immediately adjacent air cyst, extending posteriorly from the spiculated nodule. Neoplasm remains a concern. 2.  Stable post treatment changes in the parahilar left lung. 3.  Emphysema (ICD10-J43.9) and Aortic Atherosclerosis (ICD10-170.0) Electronically Signed   By: Misty Stanley M.D.   On: 10/27/2022 12:02     Past medical hx Past Medical History:  Diagnosis Date   Chronic mastoiditis, bilateral    COPD (chronic obstructive pulmonary disease) (HCC)    Hearing loss    Hypertension    Lung cancer ([place])    Pneumothorax, left    History of post op   Prostate cancer ([place])    Shortness of breath dyspnea    on exertion   Spinal stenosis      Social History   Tobacco Use   Smoking status: Former    Packs/day: 1.00    Years: 35.00    Total pack years: 35.00    Types: Cigarettes    Quit date: 05/11/2014    Years since quitting: 8.4   Smokeless tobacco: Never  Vaping Use   Vaping Use: Never used  Substance Use Topics  Alcohol use: No   Drug use: No    Kyle Hamilton reports that he quit smoking about 8 years ago. His smoking use included cigarettes. He has a 35.00 pack-year smoking history. He has never used smokeless tobacco. He reports that he does not drink alcohol and does not use drugs.  Tobacco Cessation: Former smoker , Quit 2015   Past surgical hx, Family hx, Social hx all reviewed.  Current Outpatient Medications on File Prior to Visit  Medication Sig   albuterol (VENTOLIN HFA) 108 (90 Base) MCG/ACT inhaler Inhale 2 puffs into the lungs every 6 (six) hours as needed for wheezing or shortness of breath.   budesonide-formoterol (SYMBICORT) 160-4.5 MCG/ACT inhaler Inhale 2 puffs into the lungs every evening.   hydrochlorothiazide (MICROZIDE) 12.5 MG capsule Take 12.5 mg by mouth daily.   Budeson-Glycopyrrol-Formoterol (BREZTRI AEROSPHERE) 160-9-4.8 MCG/ACT AERO Inhale 2 puffs into the lungs in the morning and at bedtime. (Patient not taking: Reported on 10/27/2022)   No current facility-administered medications on file prior to visit.     No Known Allergies  Review Of  Systems:  Constitutional:   No  weight loss, night sweats,  Fevers, chills, fatigue, or  lassitude.  HEENT:   No headaches,  Difficulty swallowing,  Tooth/dental problems, or  Sore throat,                No sneezing, itching, ear ache, nasal congestion, post nasal drip,   CV:  No chest pain,  Orthopnea, PND, swelling in lower extremities, anasarca, dizziness, palpitations, syncope.   GI  No heartburn, indigestion, abdominal pain, nausea, vomiting, diarrhea, change in bowel habits, loss of appetite, bloody stools.   Resp: No shortness of breath with exertion or at rest.  No excess mucus, no productive cough,  No non-productive cough,  No coughing up of blood.  No change in color of mucus.  No wheezing.  No chest wall deformity  Skin: no rash or lesions.  GU: no dysuria, change in color of urine, no urgency or frequency.  No flank pain, no hematuria   MS:  No joint pain or swelling.  No decreased range of motion.  No back pain.  Psych:  No change in mood or affect. No depression or anxiety.  No memory loss.   Vital Signs BP 112/72 (BP Location: Left Arm, Patient Position: Sitting, Cuff Size: Normal)   Pulse 93   Temp 97.6 F (36.4 C) (Oral)   Ht 5' 5" (1.651 m)   Wt 115 lb 3.2 oz (52.3 kg)   SpO2 95%   BMI 19.17 kg/m    Physical Exam:  General- No distress,  A&Ox3 ENT: No sinus tenderness, TM clear, pale nasal mucosa, no oral exudate,no post nasal drip, no LAN Cardiac: S1, S2, regular rate and rhythm, no murmur Chest: No wheeze/ rales/ dullness; no accessory muscle use, no nasal flaring, no sternal retractions Abd.: Soft Non-tender Ext: No clubbing cyanosis, edema Neuro:  normal strength Skin: No rashes, warm and dry Psych: normal mood and behavior   Assessment/Plan History of Lung cancer >> Tx with SBRT Former smoker  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 Options for next steps will be re-biopsy, or refer to radiation oncology for radiation treatment to the area.  I have messaged Dr. Valeta Harms , and will  call you and let you know as soon as he responds.  He feels we should biopsy the area again.  Start Breztri in the morning. 2 puffs twice daily Rinse mouth after each use.  Follow up as needed Please contact office for sooner follow up if symptoms do not improve or worsen or seek emergency care    I spent 40 minutes dedicated to the care of this patient on the date of this encounter to include pre-visit review of records, face-to-face time with the patient discussing conditions above, post visit ordering of testing, clinical documentation with the electronic health record, making appropriate referrals as documented, and communicating necessary information to the patient's healthcare team.   Addendum 10/27/2022 I have called the patient after speaking with Dr. Valeta Harms. Plan will be to get patient scheduled for a repeat biopsy . Dr. Valeta Harms has an opening 11/10/2022. We will work on getting the patient scheduled. He is not on blood thinners.   Magdalen Spatz, NP 10/27/2022  4:18 PM

## 2022-10-27 NOTE — Patient Instructions (Addendum)
It is good to see you today. The area of concern in your right middle lobe has progressed in the 3 months since your PET scan and biopsy. Options for next steps will be re-biopsy, or refer to radiation oncology for radiation treatment to the area.  I have messaged Dr. Valeta Harms , and will call you and let you know as soon as he responds.  Start Breztri in the morning. 2 puffs twice daily Rinse mouth after each use.  Follow up as needed Please contact office for sooner follow up if symptoms do not improve or worsen or seek emergency care

## 2022-10-27 NOTE — Progress Notes (Signed)
 History of Present Illness Kyle Dunne Jr. is a 66 y.o. male former smoker ( Quit 2015 with a 35 pack year smoking history) with history of lung cancer  in 2015 , post SBRT, and concern for recurrence after surveillance PET scan 06/25/2022. He was referred to Dr. Icard for biopsy of nodule of concern.He underwent Flexible video fiberoptic bronchoscopy with robotic assistance and biopsies per Dr. Icard on 07/21/2022 which was negative for malignancy.Plan is for continued imaging surveillance.   Synopsis This is a 66-year-old gentleman, history of left-sided pneumothorax, history of prostate cancer.  Patient last seen in the office by medical oncology on 07/01/2022.  Patient diagnosed with a stage Ia, T1b, N0, M0 non-small cell lung cancer positive EGFR mutation in December 2015.  This was treated with SBRT. Pt. Also had recurrence of disease with a stage Ia in the right middle lobe.Patient had a recent nuclear medicine PET scan on 06/25/2022 with an enlarging spiculated right middle lobe lung nodule concerning for malignancy. patient was referred by medical oncology for consideration of repeat biopsy and tissue diagnosis to confirm recurrence of disease.    10/27/2022 Pt. Presents for follow up after 3 month CT Surveillance of RML lung nodule. This nodule was biopsied 07/21/2022 and negative for malignancy. 3 Month follow up scan is concerning for malignancy. I have messaged Dr. Icard regarding best options moving forward.  He has not started his Breztri yet, but plans to start it tomorrow morning. He knows to stop using his Symbicort while using the Breztri.     Test Results: CT Chest without Contrast 10/26/2022 11 x 10 mm nodule in the posterior right middle lobe previously is 11 x 10 mm again today on image 76/5. This is immediately adjacent to a air cyst and there is new soft tissue thickening along the lateral wall the air cyst, extending posteriorly from the spiculated nodule (compare image  75/5 today to image 73/5 previously). Post treatment changes in the parahilar left lung are stable. No new suspicious pulmonary nodule or mass. No focal airspace consolidation. No pleural effusion.  Continued progression of the 11 x 10 mm posterior right middle lobe pulmonary nodule. There is new soft tissue thickening along the lateral wall an immediately adjacent air cyst, extending posteriorly from the spiculated nodule. Neoplasm remains a concern. 2. Stable post treatment changes in the parahilar left lung. 3.  Emphysema (ICD10-J43.9) and Aortic Atherosclerosis (ICD10-170.0)  07/21/2022 Cytology Biopsy RUL mass A. LUNG, RUL MASS, NEEDLE  BIOPSY FORCEPS:  - No malignant cells identified, see comment   B. LUNG, RUL MASS, BRUSHING:  - No malignant cells identified, see comment      Latest Ref Rng & Units 07/21/2022    5:47 AM 06/15/2022   11:51 AM 06/16/2021    2:04 PM  CBC  WBC 4.0 - 10.5 K/uL 5.4  5.9  5.7   Hemoglobin 13.0 - 17.0 g/dL 14.6  14.4  15.6   Hematocrit 39.0 - 52.0 % 45.6  43.3  47.0   Platelets 150 - 400 K/uL 268  332  277        Latest Ref Rng & Units 07/21/2022    5:47 AM 06/15/2022   11:51 AM 06/16/2021    2:04 PM  BMP  Glucose 70 - 99 mg/dL 95  90  83   BUN 8 - 23 mg/dL 10  10  11   Creatinine 0.61 - 1.24 mg/dL 0.80  0.63  0.67   Sodium 135 - 145   mmol/L 137  138  141   Potassium 3.5 - 5.1 mmol/L 3.4  3.9  3.8   Chloride 98 - 111 mmol/L 100  101  103   CO2 22 - 32 mmol/L 30  34  29   Calcium 8.9 - 10.3 mg/dL 9.1  9.2  9.8     BNP No results found for: "BNP"  ProBNP No results found for: "PROBNP"  PFT    Component Value Date/Time   FEV1PRE 1.25 09/26/2014 0841   FEV1POST 1.67 09/26/2014 0841   FVCPRE 3.06 09/26/2014 0841   FVCPOST 3.56 09/26/2014 0841   TLC 7.51 09/26/2014 0841   DLCOUNC 20.25 09/26/2014 0841   PREFEV1FVCRT 41 09/26/2014 0841   PSTFEV1FVCRT 47 09/26/2014 0841    CT CHEST WO CONTRAST  Result Date: 10/27/2022 CLINICAL DATA:   Pulmonary nodule. EXAM: CT CHEST WITHOUT CONTRAST TECHNIQUE: Multidetector CT imaging of the chest was performed following the standard protocol without IV contrast. RADIATION DOSE REDUCTION: This exam was performed according to the departmental dose-optimization program which includes automated exposure control, adjustment of the mA and/or kV according to patient size and/or use of iterative reconstruction technique. COMPARISON:  06/12/2022 FINDINGS: Cardiovascular: The heart size is normal. No substantial pericardial effusion. Coronary artery calcification is evident. Mild atherosclerotic calcification is noted in the wall of the thoracic aorta. Mediastinum/Nodes: No mediastinal lymphadenopathy. No evidence for gross hilar lymphadenopathy although assessment is limited by the lack of intravenous contrast on the current study. Dense calcification again noted in the mid thoracic esophagus. There is no axillary lymphadenopathy. Lungs/Pleura: Centrilobular emphsyema noted. 11 x 10 mm nodule in the posterior right middle lobe previously is 11 x 10 mm again today on image 76/5. This is immediately adjacent to a air cyst and there is new soft tissue thickening along the lateral wall the air cyst, extending posteriorly from the spiculated nodule (compare image 75/5 today to image 73/5 previously). Post treatment changes in the parahilar left lung are stable. No new suspicious pulmonary nodule or mass. No focal airspace consolidation. No pleural effusion. Upper Abdomen: Stable tiny hypodensities in the liver parenchyma are compatible with cyst. No followup imaging is recommended. Musculoskeletal: No worrisome lytic or sclerotic osseous abnormality. IMPRESSION: 1. Continued progression of the 11 x 10 mm posterior right middle lobe pulmonary nodule. There is new soft tissue thickening along the lateral wall an immediately adjacent air cyst, extending posteriorly from the spiculated nodule. Neoplasm remains a concern. 2.  Stable post treatment changes in the parahilar left lung. 3.  Emphysema (ICD10-J43.9) and Aortic Atherosclerosis (ICD10-170.0) Electronically Signed   By: Eric  Mansell M.D.   On: 10/27/2022 12:02     Past medical hx Past Medical History:  Diagnosis Date   Chronic mastoiditis, bilateral    COPD (chronic obstructive pulmonary disease) (HCC)    Hearing loss    Hypertension    Lung cancer (HCC)    Pneumothorax, left    History of post op   Prostate cancer (HCC)    Shortness of breath dyspnea    on exertion   Spinal stenosis      Social History   Tobacco Use   Smoking status: Former    Packs/day: 1.00    Years: 35.00    Total pack years: 35.00    Types: Cigarettes    Quit date: 05/11/2014    Years since quitting: 8.4   Smokeless tobacco: Never  Vaping Use   Vaping Use: Never used  Substance Use Topics     Alcohol use: No   Drug use: No    Mr.Kocher reports that he quit smoking about 8 years ago. His smoking use included cigarettes. He has a 35.00 pack-year smoking history. He has never used smokeless tobacco. He reports that he does not drink alcohol and does not use drugs.  Tobacco Cessation: Former smoker , Quit 2015   Past surgical hx, Family hx, Social hx all reviewed.  Current Outpatient Medications on File Prior to Visit  Medication Sig   albuterol (VENTOLIN HFA) 108 (90 Base) MCG/ACT inhaler Inhale 2 puffs into the lungs every 6 (six) hours as needed for wheezing or shortness of breath.   budesonide-formoterol (SYMBICORT) 160-4.5 MCG/ACT inhaler Inhale 2 puffs into the lungs every evening.   hydrochlorothiazide (MICROZIDE) 12.5 MG capsule Take 12.5 mg by mouth daily.   Budeson-Glycopyrrol-Formoterol (BREZTRI AEROSPHERE) 160-9-4.8 MCG/ACT AERO Inhale 2 puffs into the lungs in the morning and at bedtime. (Patient not taking: Reported on 10/27/2022)   No current facility-administered medications on file prior to visit.     No Known Allergies  Review Of  Systems:  Constitutional:   No  weight loss, night sweats,  Fevers, chills, fatigue, or  lassitude.  HEENT:   No headaches,  Difficulty swallowing,  Tooth/dental problems, or  Sore throat,                No sneezing, itching, ear ache, nasal congestion, post nasal drip,   CV:  No chest pain,  Orthopnea, PND, swelling in lower extremities, anasarca, dizziness, palpitations, syncope.   GI  No heartburn, indigestion, abdominal pain, nausea, vomiting, diarrhea, change in bowel habits, loss of appetite, bloody stools.   Resp: No shortness of breath with exertion or at rest.  No excess mucus, no productive cough,  No non-productive cough,  No coughing up of blood.  No change in color of mucus.  No wheezing.  No chest wall deformity  Skin: no rash or lesions.  GU: no dysuria, change in color of urine, no urgency or frequency.  No flank pain, no hematuria   MS:  No joint pain or swelling.  No decreased range of motion.  No back pain.  Psych:  No change in mood or affect. No depression or anxiety.  No memory loss.   Vital Signs BP 112/72 (BP Location: Left Arm, Patient Position: Sitting, Cuff Size: Normal)   Pulse 93   Temp 97.6 F (36.4 C) (Oral)   Ht 5' 5" (1.651 m)   Wt 115 lb 3.2 oz (52.3 kg)   SpO2 95%   BMI 19.17 kg/m    Physical Exam:  General- No distress,  A&Ox3 ENT: No sinus tenderness, TM clear, pale nasal mucosa, no oral exudate,no post nasal drip, no LAN Cardiac: S1, S2, regular rate and rhythm, no murmur Chest: No wheeze/ rales/ dullness; no accessory muscle use, no nasal flaring, no sternal retractions Abd.: Soft Non-tender Ext: No clubbing cyanosis, edema Neuro:  normal strength Skin: No rashes, warm and dry Psych: normal mood and behavior   Assessment/Plan History of Lung cancer >> Tx with SBRT Former smoker  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 Options for next steps will be re-biopsy, or refer to radiation oncology for radiation treatment to the area.  I have messaged Dr. Icard , and will   call you and let you know as soon as he responds.  He feels we should biopsy the area again.  Start Breztri in the morning. 2 puffs twice daily Rinse mouth after each use.  Follow up as needed Please contact office for sooner follow up if symptoms do not improve or worsen or seek emergency care    I spent 40 minutes dedicated to the care of this patient on the date of this encounter to include pre-visit review of records, face-to-face time with the patient discussing conditions above, post visit ordering of testing, clinical documentation with the electronic health record, making appropriate referrals as documented, and communicating necessary information to the patient's healthcare team.   Addendum 10/27/2022 I have called the patient after speaking with Dr. Valeta Harms. Plan will be to get patient scheduled for a repeat biopsy . Dr. Valeta Harms has an opening 11/10/2022. We will work on getting the patient scheduled. He is not on blood thinners.   Magdalen Spatz, NP 10/27/2022  4:18 PM

## 2022-10-28 ENCOUNTER — Other Ambulatory Visit: Payer: Self-pay | Admitting: Acute Care

## 2022-10-28 DIAGNOSIS — R911 Solitary pulmonary nodule: Secondary | ICD-10-CM

## 2022-10-29 ENCOUNTER — Encounter: Payer: Self-pay | Admitting: Pulmonary Disease

## 2022-10-29 ENCOUNTER — Other Ambulatory Visit: Payer: Self-pay

## 2022-10-29 DIAGNOSIS — R911 Solitary pulmonary nodule: Secondary | ICD-10-CM

## 2022-11-06 ENCOUNTER — Encounter (HOSPITAL_COMMUNITY): Payer: Self-pay | Admitting: Pulmonary Disease

## 2022-11-06 ENCOUNTER — Other Ambulatory Visit: Payer: Self-pay

## 2022-11-06 ENCOUNTER — Other Ambulatory Visit: Payer: Medicare Other

## 2022-11-06 DIAGNOSIS — R911 Solitary pulmonary nodule: Secondary | ICD-10-CM

## 2022-11-06 NOTE — Progress Notes (Signed)
Kyle. Kyle Hamilton denies chest pain or shortness of breath. Patient denies having any s/s of Covid in his household, also denies any known exposure to Covid.  Kyle Hamilton was tested for Covid today.  Kyle Hamilton' PCP is Dr. Theadore Nan.

## 2022-11-08 LAB — NOVEL CORONAVIRUS, NAA: SARS-CoV-2, NAA: NOT DETECTED

## 2022-11-08 LAB — SPECIMEN STATUS REPORT

## 2022-11-10 ENCOUNTER — Ambulatory Visit (HOSPITAL_COMMUNITY)
Admission: RE | Admit: 2022-11-10 | Discharge: 2022-11-10 | Disposition: A | Discharge status: Home / Self Care | Payer: Medicare Other | Attending: Pulmonary Disease | Admitting: Pulmonary Disease

## 2022-11-10 ENCOUNTER — Ambulatory Visit (HOSPITAL_COMMUNITY): Payer: Medicare Other

## 2022-11-10 ENCOUNTER — Ambulatory Visit (HOSPITAL_BASED_OUTPATIENT_CLINIC_OR_DEPARTMENT_OTHER): Payer: Medicare Other | Admitting: Certified Registered Nurse Anesthetist

## 2022-11-10 ENCOUNTER — Encounter (HOSPITAL_COMMUNITY)
Admission: RE | Disposition: A | Discharge status: Home / Self Care | Payer: Self-pay | Source: Home / Self Care | Attending: Pulmonary Disease

## 2022-11-10 ENCOUNTER — Encounter (HOSPITAL_COMMUNITY): Payer: Self-pay | Admitting: Pulmonary Disease

## 2022-11-10 ENCOUNTER — Ambulatory Visit (HOSPITAL_COMMUNITY): Payer: Medicare Other | Admitting: Certified Registered Nurse Anesthetist

## 2022-11-10 DIAGNOSIS — Z8546 Personal history of malignant neoplasm of prostate: Secondary | ICD-10-CM | POA: Insufficient documentation

## 2022-11-10 DIAGNOSIS — I10 Essential (primary) hypertension: Secondary | ICD-10-CM | POA: Insufficient documentation

## 2022-11-10 DIAGNOSIS — R918 Other nonspecific abnormal finding of lung field: Secondary | ICD-10-CM | POA: Diagnosis not present

## 2022-11-10 DIAGNOSIS — Z85118 Personal history of other malignant neoplasm of bronchus and lung: Secondary | ICD-10-CM | POA: Insufficient documentation

## 2022-11-10 DIAGNOSIS — Z923 Personal history of irradiation: Secondary | ICD-10-CM | POA: Insufficient documentation

## 2022-11-10 DIAGNOSIS — R911 Solitary pulmonary nodule: Secondary | ICD-10-CM | POA: Diagnosis present

## 2022-11-10 DIAGNOSIS — Z87891 Personal history of nicotine dependence: Secondary | ICD-10-CM

## 2022-11-10 DIAGNOSIS — J449 Chronic obstructive pulmonary disease, unspecified: Secondary | ICD-10-CM | POA: Diagnosis not present

## 2022-11-10 DIAGNOSIS — Z9889 Other specified postprocedural states: Secondary | ICD-10-CM | POA: Diagnosis not present

## 2022-11-10 DIAGNOSIS — R846 Abnormal cytological findings in specimens from respiratory organs and thorax: Secondary | ICD-10-CM | POA: Diagnosis not present

## 2022-11-10 HISTORY — PX: BRONCHIAL NEEDLE ASPIRATION BIOPSY: SHX5106

## 2022-11-10 HISTORY — PX: BRONCHIAL BRUSHINGS: SHX5108

## 2022-11-10 HISTORY — PX: BRONCHIAL BIOPSY: SHX5109

## 2022-11-10 LAB — BASIC METABOLIC PANEL
Anion gap: 7 (ref 5–15)
BUN: 9 mg/dL (ref 8–23)
CO2: 30 mmol/L (ref 22–32)
Calcium: 9 mg/dL (ref 8.9–10.3)
Chloride: 97 mmol/L — ABNORMAL LOW (ref 98–111)
Creatinine, Ser: 0.74 mg/dL (ref 0.61–1.24)
GFR, Estimated: >60 mL/min (ref >60)
Glucose, Bld: 88 mg/dL (ref 70–99)
Potassium: 3.2 mmol/L — ABNORMAL LOW (ref 3.5–5.1)
Sodium: 134 mmol/L — ABNORMAL LOW (ref 135–145)

## 2022-11-10 LAB — CBC
HCT: 46.9 % (ref 39.0–52.0)
Hemoglobin: 15.4 g/dL (ref 13.0–17.0)
MCH: 29.7 pg (ref 26.0–34.0)
MCHC: 32.8 g/dL (ref 30.0–36.0)
MCV: 90.4 fL (ref 80.0–100.0)
Platelets: 250 10*3/uL (ref 150–400)
RBC: 5.19 MIL/uL (ref 4.22–5.81)
RDW: 13.1 % (ref 11.5–15.5)
WBC: 5.6 10*3/uL (ref 4.0–10.5)
nRBC: 0 % (ref 0.0–0.2)

## 2022-11-10 SURGERY — BRONCHOSCOPY, WITH BIOPSY USING ELECTROMAGNETIC NAVIGATION
Anesthesia: General

## 2022-11-10 MED ORDER — LIDOCAINE 2% (20 MG/ML) 5 ML SYRINGE
INTRAMUSCULAR | Status: DC | PRN
  Administered 2022-11-10: 40 mg via INTRAVENOUS

## 2022-11-10 MED ORDER — PHENYLEPHRINE HCL-NACL 20-0.9 MG/250ML-% IV SOLN
INTRAVENOUS | Status: DC | PRN
  Administered 2022-11-10: 25 ug/min via INTRAVENOUS

## 2022-11-10 MED ORDER — SUGAMMADEX SODIUM 200 MG/2ML IV SOLN
INTRAVENOUS | Status: DC | PRN
  Administered 2022-11-10: 250 mg via INTRAVENOUS

## 2022-11-10 MED ORDER — MIDAZOLAM HCL 2 MG/2ML IJ SOLN
INTRAMUSCULAR | Status: DC | PRN
  Administered 2022-11-10: 2 mg via INTRAVENOUS

## 2022-11-10 MED ORDER — PROPOFOL 10 MG/ML IV BOLUS
INTRAVENOUS | Status: DC | PRN
  Administered 2022-11-10: 160 mg via INTRAVENOUS

## 2022-11-10 MED ORDER — PROPOFOL 500 MG/50ML IV EMUL
INTRAVENOUS | Status: DC | PRN
  Administered 2022-11-10: 100 ug/kg/min via INTRAVENOUS

## 2022-11-10 MED ORDER — LACTATED RINGERS IV SOLN: INTRAVENOUS | Status: DC | PRN

## 2022-11-10 MED ORDER — PHENYLEPHRINE 80 MCG/ML (10ML) SYRINGE FOR IV PUSH (FOR BLOOD PRESSURE SUPPORT)
PREFILLED_SYRINGE | INTRAVENOUS | Status: DC | PRN
  Administered 2022-11-10 (×2): 160 ug via INTRAVENOUS

## 2022-11-10 MED ORDER — CHLORHEXIDINE GLUCONATE 0.12 % MT SOLN
OROMUCOSAL | Status: AC
  Filled 2022-11-10: qty 15

## 2022-11-10 MED ORDER — LACTATED RINGERS IV SOLN: INTRAVENOUS | Status: DC

## 2022-11-10 MED ORDER — FENTANYL CITRATE (PF) 250 MCG/5ML IJ SOLN
INTRAMUSCULAR | Status: DC | PRN
  Administered 2022-11-10: 50 ug via INTRAVENOUS

## 2022-11-10 MED ORDER — DEXAMETHASONE SODIUM PHOSPHATE 10 MG/ML IJ SOLN
INTRAMUSCULAR | Status: DC | PRN
  Administered 2022-11-10: 4 mg via INTRAVENOUS

## 2022-11-10 MED ORDER — ROCURONIUM BROMIDE 10 MG/ML (PF) SYRINGE
PREFILLED_SYRINGE | INTRAVENOUS | Status: DC | PRN
  Administered 2022-11-10: 60 mg via INTRAVENOUS

## 2022-11-10 MED ORDER — ONDANSETRON HCL 4 MG/2ML IJ SOLN
INTRAMUSCULAR | Status: DC | PRN
  Administered 2022-11-10: 4 mg via INTRAVENOUS

## 2022-11-10 NOTE — Anesthesia Procedure Notes (Signed)
Procedure Name: Intubation Date/Time: 11/10/2022 7:47 AM  Performed by: Bryson Corona, CRNAPre-anesthesia Checklist: Patient identified, Emergency Drugs available, Suction available and Patient being monitored Patient Re-evaluated:Patient Re-evaluated prior to induction Oxygen Delivery Method: Circle System Utilized Preoxygenation: Pre-oxygenation with 100% oxygen Induction Type: IV induction Ventilation: Mask ventilation without difficulty Laryngoscope Size: Mac and 4 Grade View: Grade I Tube type: Oral Number of attempts: 1 Airway Equipment and Method: Stylet and Oral airway Placement Confirmation: ETT inserted through vocal cords under direct vision, positive ETCO2 and breath sounds checked- equal and bilateral Secured at: 22 cm Tube secured with: Tape Dental Injury: Teeth and Oropharynx as per pre-operative assessment

## 2022-11-10 NOTE — Interval H&P Note (Signed)
History and Physical Interval Note:  11/10/2022 7:26 AM  Kyle Hamilton.  has presented today for surgery, with the diagnosis of right middle lobe lung nodule.  The various methods of treatment have been discussed with the patient and family. After consideration of risks, benefits and other options for treatment, the patient has consented to  Procedure(s): ROBOTIC ASSISTED NAVIGATIONAL BRONCHOSCOPY (N/A) as a surgical intervention.  The patient's history has been reviewed, patient examined, no change in status, stable for surgery.  I have reviewed the patient's chart and labs.  Questions were answered to the patient's satisfaction.     [place]

## 2022-11-10 NOTE — Op Note (Signed)
Video Bronchoscopy with Robotic Assisted Bronchoscopic Navigation   Date of Operation: 11/10/2022   Pre-op Diagnosis: Right middle lobe pulmonary nodule  Post-op Diagnosis: Right middle lobe pulmonary nodule  Surgeon: Garner Nash, DO  Assistants: None   Anesthesia: General endotracheal anesthesia  Operation: Flexible video fiberoptic bronchoscopy with robotic assistance and biopsies.  Estimated Blood Loss: Minimal  Complications: None  Indications and History: Kyle Hamilton. is a 67 y.o. male with history of right middle lobe pulmonary nodule. The risks, benefits, complications, treatment options and expected outcomes were discussed with the patient.  The possibilities of pneumothorax, pneumonia, reaction to medication, pulmonary aspiration, perforation of a viscus, bleeding, failure to diagnose a condition and creating a complication requiring transfusion or operation were discussed with the patient who freely signed the consent.    Description of Procedure: The patient was seen in the Preoperative Area, was examined and was deemed appropriate to proceed.  The patient was taken to [facility] endoscopy room room 3, identified as Kyle Hamilton. and the procedure verified as Flexible Video Fiberoptic Bronchoscopy.  A Time Out was held and the above information confirmed.   Prior to the date of the procedure a high-resolution CT scan of the chest was performed. Utilizing ION software program a virtual tracheobronchial tree was generated to allow the creation of distinct navigation pathways to the patient's parenchymal abnormalities. After being taken to the operating room general anesthesia was initiated and the patient  was orally intubated. The video fiberoptic bronchoscope was introduced via the endotracheal tube and a general inspection was performed which showed normal right and left lung anatomy, aspiration of the bilateral mainstems was completed to remove any remaining secretions. Robotic  catheter inserted into patient's endotracheal tube.   Target #1 right middle lobe: The distinct navigation pathways prepared prior to this procedure were then utilized to navigate to patient's lesion identified on CT scan. The robotic catheter was secured into place and the vision probe was withdrawn.  Lesion location was approximated using fluoroscopy and three-dimensional cone beam CT imaging. Under fluoroscopic guidance transbronchial needle brushings, transbronchial needle biopsies, and transbronchial forceps biopsies were performed to be sent for cytology and pathology.  At the end of the procedure a general airway inspection was performed and there was no evidence of active bleeding. The bronchoscope was removed.  The patient tolerated the procedure well. There was no significant blood loss and there were no obvious complications. A post-procedural chest x-ray is pending.  Samples Target #1: 1. Transbronchial needle brushings from right middle lobe 2. Transbronchial Wang needle biopsies from right middle lobe 3. Transbronchial forceps biopsies from right middle lobe  Plans:  The patient will be discharged from the PACU to home when recovered from anesthesia and after chest x-ray is reviewed. We will review the cytology, pathology results with the patient when they become available. Outpatient followup will be with Garner Nash, [place] Genevia Bouldin, DO [facility] 11/10/2022 8:49 AM

## 2022-11-10 NOTE — Discharge Instructions (Signed)
Flexible Bronchoscopy, Care After This sheet gives you information about how to care for yourself after your test. Your doctor may also give you more specific instructions. If you have problems or questions, contact your doctor. Follow these instructions at home: Eating and drinking Do not eat or drink anything (not even water) for 2 hours after your test, or until your numbing medicine (local anesthetic) wears off. When your numbness is gone and your cough and gag reflexes have come back, you may: Eat only soft foods. Slowly drink liquids. The day after the test, go back to your normal diet. Driving Do not drive for 24 hours if you were given a medicine to help you relax (sedative). Do not drive or use heavy machinery while taking prescription pain medicine. General instructions  Take over-the-counter and prescription medicines only as told by your doctor. Return to your normal activities as told. Ask what activities are safe for you. Do not use any products that have nicotine or tobacco in them. This includes cigarettes and e-cigarettes. If you need help quitting, ask your doctor. Keep all follow-up visits as told by your doctor. This is important. It is very important if you had a tissue sample (biopsy) taken. Get help right away if: You have shortness of breath that gets worse. You get light-headed. You feel like you are going to pass out (faint). You have chest pain. You cough up: More than a little blood. More blood than before. Summary Do not eat or drink anything (not even water) for 2 hours after your test, or until your numbing medicine wears off. Do not use cigarettes. Do not use e-cigarettes. Get help right away if you have chest pain.  This information is not intended to replace advice given to you by your health care provider. Make sure you discuss any questions you have with your health care provider. Document Released: 08/23/2009 Document Revised: 10/08/2017 Document  Reviewed: 11/13/2016 Elsevier Patient Education  2020 [employer].

## 2022-11-10 NOTE — Anesthesia Preprocedure Evaluation (Addendum)
Anesthesia Evaluation  Patient identified by MRN, date of birth, ID band Patient awake    Reviewed: Allergy & Precautions, NPO status , Patient's Chart, lab work & pertinent test results  Airway Mallampati: II  TM Distance: >3 FB Neck ROM: Full    Dental  (+) Edentulous Upper, Edentulous Lower   Pulmonary COPD,  COPD inhaler, former smoker   breath sounds clear to auscultation       Cardiovascular hypertension,  Rhythm:Regular Rate:Normal     Neuro/Psych negative neurological ROS  negative psych ROS   GI/Hepatic negative GI ROS, Neg liver ROS,,,  Endo/Other  negative endocrine ROS    Renal/GU negative Renal ROS     Musculoskeletal negative musculoskeletal ROS (+)    Abdominal   Peds  Hematology negative hematology ROS (+)   Anesthesia Other Findings   Reproductive/Obstetrics                             Anesthesia Physical Anesthesia Plan  ASA: 3  Anesthesia Plan: General   Post-op Pain Management: Minimal or no pain anticipated   Induction: Intravenous  PONV Risk Score and Plan: TIVA, Propofol infusion, Ondansetron and Midazolam  Airway Management Planned: Oral ETT  Additional Equipment: None  Intra-op Plan:   Post-operative Plan: Extubation in OR  Informed Consent: I have reviewed the patients History and Physical, chart, labs and discussed the procedure including the risks, benefits and alternatives for the proposed anesthesia with the patient or authorized representative who has indicated his/her understanding and acceptance.       Plan Discussed with: CRNA  Anesthesia Plan Comments:         Anesthesia Quick Evaluation

## 2022-11-10 NOTE — Anesthesia Postprocedure Evaluation (Signed)
Anesthesia Post Note  Patient: Kyle Hamilton.  Procedure(s) Performed: ROBOTIC ASSISTED NAVIGATIONAL BRONCHOSCOPY BRONCHIAL BIOPSIES BRONCHIAL NEEDLE ASPIRATION BIOPSIES BRONCHIAL BRUSHINGS     Patient location during evaluation: PACU Anesthesia Type: General Level of consciousness: awake and alert Pain management: pain level controlled Vital Signs Assessment: post-procedure vital signs reviewed and stable Respiratory status: spontaneous breathing, nonlabored ventilation, respiratory function stable and patient connected to nasal cannula oxygen Cardiovascular status: blood pressure returned to baseline and stable Postop Assessment: no apparent nausea or vomiting Anesthetic complications: no   No notable events documented.  Last Vitals:  Vitals:   11/10/22 0900 11/10/22 0915  BP: 124/84 121/79  Pulse: 72 71  Resp: 16 14  Temp: 36.4 C   SpO2: 100% 92%    Last Pain:  Vitals:   11/10/22 0900  TempSrc:   PainSc: 0-No pain                 Effie Berkshire

## 2022-11-10 NOTE — Transfer of Care (Signed)
Immediate Anesthesia Transfer of Care Note  Patient: Kyle Hamilton.  Procedure(s) Performed: ROBOTIC ASSISTED NAVIGATIONAL BRONCHOSCOPY BRONCHIAL BIOPSIES BRONCHIAL NEEDLE ASPIRATION BIOPSIES BRONCHIAL BRUSHINGS  Patient Location: PACU  Anesthesia Type:General  Level of Consciousness: drowsy  Airway & Oxygen Therapy: Patient Spontanous Breathing and Patient connected to face mask oxygen  Post-op Assessment: Report given to RN and Post -op Vital signs reviewed and stable  Post vital signs: Reviewed and stable  Last Vitals:  Vitals Value Taken Time  BP 124/84 11/10/22 0900  Temp    Pulse 72 11/10/22 0902  Resp 17 11/10/22 0902  SpO2 99 % 11/10/22 0902  Vitals shown include unvalidated device data.  Last Pain:  Vitals:   11/10/22 0624  TempSrc:   PainSc: 0-No pain         Complications: No notable events documented.

## 2022-11-11 ENCOUNTER — Encounter (HOSPITAL_COMMUNITY): Payer: Self-pay | Admitting: Pulmonary Disease

## 2022-11-12 LAB — CYTOLOGY - NON PAP

## 2022-11-18 NOTE — Progress Notes (Signed)
Kyle Hamilton,   Called and spoke with patient regarding pathology results.  Please place referral to Dr. Tammi Klippel in radiation oncology.  I think it is best to just go ahead and consider SBRT treatments.  We have biopsied this lesion twice.  Each time has atypical cells suggestive of malignancy.  I think it is reasonable to consider treatments for it.  Will let him discuss the pros and cons with Dr. Tammi Klippel.  Thanks,  BLI  Garner Nash, DO [facility] 11/18/2022 4:44 PM

## 2022-11-19 ENCOUNTER — Telehealth: Payer: Self-pay | Admitting: Radiation Oncology

## 2022-11-19 NOTE — Telephone Encounter (Signed)
LVM to schedule CON with Dr. Tammi Klippel

## 2022-11-20 ENCOUNTER — Ambulatory Visit: Admission: RE | Admit: 2022-11-20 | Payer: Medicare Other | Source: Ambulatory Visit

## 2022-11-20 ENCOUNTER — Ambulatory Visit
Admission: RE | Admit: 2022-11-20 | Discharge: 2022-11-20 | Disposition: A | Discharge status: Home / Self Care | Payer: Medicare Other | Source: Ambulatory Visit | Attending: Radiation Oncology | Admitting: Radiation Oncology

## 2022-11-20 ENCOUNTER — Other Ambulatory Visit: Payer: Self-pay

## 2022-11-20 VITALS — BP 147/93 | HR 90 | Temp 97.1°F | Resp 18 | Ht 65.0 in | Wt 117.2 lb

## 2022-11-20 DIAGNOSIS — R911 Solitary pulmonary nodule: Secondary | ICD-10-CM

## 2022-11-20 DIAGNOSIS — C342 Malignant neoplasm of middle lobe, bronchus or lung: Secondary | ICD-10-CM

## 2022-11-20 NOTE — Progress Notes (Deleted)
Thoracic Location of Tumor / Histology: Nodule of Left Lung & Nodule Middle Lobe Right Lung  11/10/2021 Dr. Elige Radon Icard DG Chest Port 1 View CLINICAL DATA:  Post bronchoscopy.  IMPRESSION: No evidence of complication following bronchoscopy. Known spiculated nodule in the right middle lobe adjacent to a fiducial marker is grossly unchanged.  10/26/2022 Kandice Robinsons, NP CT Chest without Contrast CLINICAL DATA:  Pulmonary nodule.  IMPRESSION: 1. Continued progression of the 11 x 10 mm posterior right middle lobe pulmonary nodule. There is new soft tissue thickening along the lateral wall an immediately adjacent air cyst, extending posteriorly from the spiculated nodule. Neoplasm remains a concern. 2. Stable post treatment changes in the parahilar left lung. 3.  Emphysema (ICD10-J43.9) and Aortic Atherosclerosis (ICD10-170.0)  07/21/2022 Dr. Elige Radon Icard DG Chest [facility] 1 View CLINICAL DATA:  Status post bronchoscopy  FINDINGS: Cardiomediastinal silhouette is within normal limits. There are postsurgical changes in the left mid lung. Post biopsy changes in the right mid lung with biopsy marker and focal opacity noted. There is no pleural effusion. No evidence of pneumothorax. No acute osseous abnormality. Thoracic spondylosis. Unchanged chronic left lateral fifth rib injury. Bilateral shoulder degenerative changes.   IMPRESSION: Post biopsy changes in the right mid lung. No evidence of pneumothorax.   06/25/2022 Dr. Si Gaul NM PET Image Restage (PS) Skull Base to Thigh CLINICAL DATA:  Subsequent treatment strategy for non-small cell lung cancer post left lower lobe wedge resection and radiation therapy. Subsequent SBRT of right middle lobe lesion in 2020.  Enlarging spiculated right lung nodule on CT.  IMPRESSION: 1. Enlarging spiculated right middle lobe nodule demonstrates low level hypermetabolic activity, suspicious for local recurrence of lung cancer (presumed  adenocarcinoma based on low level activity). 2. Patchy ground-glass opacity in the left lower lobe with associated low level metabolic activity, new from recent diagnostic CT and likely inflammatory. 3. No other evidence of metastatic lung cancer. 4. Stable incidental findings including nonobstructing renal calculi, sequela of prior granulomatous disease, Coronary and aortic atherosclerosis (ICD10-I70.0). Emphysema (ICD10-J43.9).  06/12/2022 Dr. Si Gaul CT Chest with Contrast CLINICAL DATA:  Non-small cell lung cancer, staging examination  IMPRESSION: 1. Interval increase in size and spiculation of a spiculated nodule within the right upper lobe adjacent to a small pneumatocele, concerning for a developing metachronous bronchogenic neoplasm. PET CT examination is recommended for further evaluation. 2. Stable surgical changes of wedge resection within the superior segment of the left lower lobe. No evidence of local recurrence. 3. Mild centrilobular emphysema. 4. Stable mild bronchiectasis and bronchial wall thickening in keeping with airway inflammation. 5. Mild coronary artery calcification. Aortic Atherosclerosis (ICD10-I70.0) and Emphysema (ICD10-J43.9).   05/23/2021 Dr. Brynda Greathouse CT Chest with Contrast CLINICAL DATA:  Left lung cancer, status post wedge resection, former smoker  IMPRESSION: 1. Unchanged postoperative findings of wedge resection primarily involving the superior segment left lower lobe. No evidence of recurrent or metastatic disease in the chest. 2. Unchanged 4 mm nodule of the posterior right upper lobe adjacent to a small pneumatocele, likely benign sequelae of infection or inflammation. Attention on follow-up. 3. Emphysema and diffuse bilateral bronchial wall thickening. 4. Coronary artery disease. Aortic Atherosclerosis (ICD10-I70.0) and Emphysema (ICD10-J43.9).   Tobacco/Marijuana/Snuff/ETOH use:  Tobacco quit 05/11/2014, no drug, smokeless, or  alcohol use.  Past/Anticipated interventions by cardiothoracic surgery, if any:   11/10/2022 Dr. Elige Radon Icard Robotic Assisted Navigational Bronchoscopy  Place referral to Dr. Kathrynn Running in radiation oncology.  I think it is best to just go ahead  and consider SBRT treatments.  We have biopsied this lesion twice.  Each time has atypical cells suggestive of malignancy.  I think it is reasonable to consider treatments for it.  Will let him discuss the pros and cons with Dr. Kathrynn Running.  Past/Anticipated interventions by medical oncology, if any: NA   Signs/Symptoms Weight changes, if any:  No, stable. Respiratory complaints, if any:  SOB with exertion and COPD. Hemoptysis, if any: No Pain issues, if any:  0/10  SAFETY ISSUES: Prior radiation? Yes, SBRT 2020, Left lower lobe. Pacemaker/ICD? No Possible current pregnancy?  Male Is the patient on methotrexate? No  Current Complaints / other details:

## 2022-11-20 NOTE — Progress Notes (Signed)
Radiation Oncology         [phone]) [phone] ________________________________  Chart Note  Name: Kyle Hamilton. MRN: [id number]  Date of Service: 11/20/2022 DOB: [birth year]  YW:[id number], Toniann Fail, MD  Josephine Igo, DO   REFERRING PHYSICIAN: Josephine Igo, DO  DIAGNOSIS: There were no encounter diagnoses.  No diagnosis found.  HISTORY OF PRESENT ILLNESS: Kyle Hamilton. is a 67 y.o. male seen at the request of Dr. Tonia Brooms. He has a history of stage I NSCLC of LUL in 2005 s/p wedge resection by Dr. Donata Clay and of prostate cancer s/p definitive radioactive seed implant 03/2018 by Dr. Vernie Ammons and Dr. Kathrynn Running. The patient was also noted to have a slowly enlarging RML lung nodule, which was treated with SBRT here in 04/2019.      He has since been followed by Dr. Cliffton Asters following Dr. Zenaida Niece Trigt's retirement and by Dr. Arbutus Ped since 06/2021 for surveillance.  On surveillance chest CT on 06/12/22, he was found to have an enlarging nodule within the right upper/middle lobe. Further evaluation with a PET scan on 06/25/22 confirmed low level hypermetabolic activity to the RML nodule. The nodule was also confirmed to be similar in size to prior PET in 03/2019 after showing treatment response followed by recent growth on recent CT.     A bronchoscopy was performed on 07/21/22 by Dr. Tonia Brooms, and cytology showed no malignant cells.   A repeat surveillance chest CT performed on 10/26/22 showed continued progression of the 1.1 cm RML pulmonary nodule, with new soft tissue thickening along lateral wall.     This prompted a repeat bronchoscopy on 11/10/21, and cytology showed rare atypical cells but was overall non-diagnostic.    ***  PREVIOUS RADIATION THERAPY: Yes  04/28/19 - 05/05/19: The RML target was treated to 54 Gy in 3 fractions of 18 Gy (SBRT)  03/25/18: Prostate seed implant / 145 Gy  PAST MEDICAL HISTORY:  Past Medical History:  Diagnosis Date   Chronic mastoiditis, bilateral    COPD  (chronic obstructive pulmonary disease) (HCC)    Hearing loss    Hypertension    Lung cancer (HCC)    Pneumothorax, left    History of post op   Prostate cancer (HCC)    Shortness of breath dyspnea    on exertion   Spinal stenosis       PAST SURGICAL HISTORY: Past Surgical History:  Procedure Laterality Date   BAHA REVISION Right 08/30/2014   Procedure: BONE ANCHORED HEARING AID (BAHA) REVISION RIGHT SIDE;  Surgeon: Serena Colonel, MD;  Location: [facility] OR;  Service: ENT;  Laterality: Right;   BONE ANCHORED HEARING AID IMPLANT  08/03/2012   Procedure: BONE ANCHORED HEARING AID (BAHA) IMPLANT;  Surgeon: Serena Colonel, MD;  Location: [facility] OR;  Service: ENT;  Laterality: Right;   BRONCHIAL BIOPSY  07/21/2022   Procedure: BRONCHIAL BIOPSIES;  Surgeon: Josephine Igo, DO;  Location: [facility];  Service: Pulmonary;;   BRONCHIAL BIOPSY  11/10/2022   Procedure: BRONCHIAL BIOPSIES;  Surgeon: Josephine Igo, DO;  Location: [facility];  Service: Pulmonary;;   BRONCHIAL BRUSHINGS  07/21/2022   Procedure: BRONCHIAL BRUSHINGS;  Surgeon: Josephine Igo, DO;  Location: [facility];  Service: Pulmonary;;   BRONCHIAL BRUSHINGS  11/10/2022   Procedure: BRONCHIAL BRUSHINGS;  Surgeon: Josephine Igo, DO;  Location: [facility];  Service: Pulmonary;;   BRONCHIAL NEEDLE ASPIRATION BIOPSY  07/21/2022   Procedure: BRONCHIAL NEEDLE ASPIRATION BIOPSIES;  Surgeon: Josephine Igo, DO;  Location: [facility];  Service: Pulmonary;;   BRONCHIAL NEEDLE ASPIRATION BIOPSY  11/10/2022   Procedure: BRONCHIAL NEEDLE ASPIRATION BIOPSIES;  Surgeon: Josephine Igo, DO;  Location: [facility];  Service: Pulmonary;;   COLONOSCOPY  2007   CYSTOSCOPY  03/25/2018   Procedure: CYSTOSCOPY;  Surgeon: Ihor Gully, MD;  Location: [facility];  Service: Urology;;  no seeds noted in bladder   FIDUCIAL MARKER PLACEMENT  07/21/2022   Procedure: FIDUCIAL MARKER PLACEMENT;  Surgeon: Josephine Igo, DO;  Location: [facility];  Service: Pulmonary;;   LEG SURGERY     right leg deep cut   MOUTH SURGERY     PROSTATE BIOPSY     RADIOACTIVE SEED IMPLANT N/A 03/25/2018   Procedure: RADIOACTIVE SEED IMPLANT/BRACHYTHERAPY IMPLANT;  Surgeon: Ihor Gully, MD;  Location: [facility];  Service: Urology;  Laterality: N/A;  71 seeds implanted   SPACE OAR INSTILLATION N/A 03/25/2018   Procedure: SPACE OAR INSTILLATION;  Surgeon: Ihor Gully, MD;  Location: [facility];  Service: Urology;  Laterality: N/A;   STAPEDES SURGERY     bilateral   VIDEO ASSISTED THORACOSCOPY (VATS)/ LOBECTOMY Left 10/09/2014   Procedure: VIDEO ASSISTED THORACOSCOPY (VATS)/ LOBECTOMY;  Surgeon: Kerin Perna, MD;  Location: [facility] OR;  Service: Thoracic;  Laterality: Left;    FAMILY HISTORY:  Family History  Problem Relation Age of Onset   Cancer Neg Hx     SOCIAL HISTORY:  Social History   Socioeconomic History   Marital status: Single    Spouse name: Not on file   Number of children: 3   Years of education: Not on file   Highest education level: Not on file  Occupational History   Occupation: retired    Comment: Music therapist  Tobacco Use   Smoking status: Former    Packs/day: 1.00    Years: 35.00    Total pack years: 35.00    Types: Cigarettes    Quit date: 05/11/2014    Years since quitting: 8.5   Smokeless tobacco: Never  Vaping Use   Vaping Use: Never used  Substance and Sexual Activity   Alcohol use: No   Drug use: No   Sexual activity: Never  Other Topics Concern   Not on file  Social History Narrative   Not on file   Social Determinants of Health   Financial Resource Strain: Not on file  Food Insecurity: Not on file  Transportation Needs: Not on file  Physical Activity: Not on file  Stress: Not on file  Social Connections: Not on file  Intimate Partner Violence: Not on file    ALLERGIES: Patient has no known allergies.  MEDICATIONS:  Current Outpatient Medications   Medication Sig Dispense Refill   albuterol (VENTOLIN HFA) 108 (90 Base) MCG/ACT inhaler Inhale 2 puffs into the lungs every 6 (six) hours as needed for wheezing or shortness of breath. 8 g 3   Budeson-Glycopyrrol-Formoterol (BREZTRI AEROSPHERE) 160-9-4.8 MCG/ACT AERO Inhale 2 puffs into the lungs in the morning and at bedtime. 1 each 2   budesonide-formoterol (SYMBICORT) 160-4.5 MCG/ACT inhaler Inhale 2 puffs into the lungs every evening. (Patient not taking: Reported on 11/04/2022) 1 each 6   hydrochlorothiazide (MICROZIDE) 12.5 MG capsule Take 12.5 mg by mouth daily.  1   No current facility-administered medications for this encounter.    REVIEW OF SYSTEMS:  On review of systems, the patient reports that he is doing well overall. He denies any chest pain, cough, fevers,  chills, night sweats, unintended weight changes. He reports shortness of breath with exertion. He denies any bowel or bladder disturbances, and denies abdominal pain, nausea or vomiting. He denies any new musculoskeletal or joint aches or pains. A complete review of systems is obtained and is otherwise negative.    PHYSICAL EXAM:  Wt Readings from Last 3 Encounters:  11/10/22 115 lb (52.2 kg)  10/27/22 115 lb 3.2 oz (52.3 kg)  10/23/22 118 lb (53.5 kg)   Temp Readings from Last 3 Encounters:  11/10/22 97.6 F (36.4 C)  10/27/22 97.6 F (36.4 C) (Oral)  08/06/22 (!) 97.5 F (36.4 C) (Oral)   BP Readings from Last 3 Encounters:  11/10/22 121/79  10/27/22 112/72  10/23/22 (!) 120/90   Pulse Readings from Last 3 Encounters:  11/10/22 71  10/27/22 93  10/23/22 85    /10  In general this is a well appearing *** man in no acute distress. He's alert and oriented x4 and appropriate throughout the examination. Cardiopulmonary assessment is negative for acute distress and he exhibits normal effort.     KPS = ***  100 - Normal; no complaints; no evidence of disease. 90   - Able to carry on normal activity; minor  signs or symptoms of disease. 80   - Normal activity with effort; some signs or symptoms of disease. 38   - Cares for self; unable to carry on normal activity or to do active work. 60   - Requires occasional assistance, but is able to care for most of his personal needs. 50   - Requires considerable assistance and frequent medical care. 40   - Disabled; requires special care and assistance. 30   - Severely disabled; hospital admission is indicated although death not imminent. 20   - Very sick; hospital admission necessary; active supportive treatment necessary. 10   - Moribund; fatal processes progressing rapidly. 0     - Dead  Karnofsky DA, Abelmann WH, Craver LS and Burchenal [facility] [phone]) The use of the nitrogen mustards in the palliative treatment of carcinoma: with particular reference to bronchogenic carcinoma Cancer 1 634-56  LABORATORY DATA:  Lab Results  Component Value Date   WBC 5.6 11/10/2022   HGB 15.4 11/10/2022   HCT 46.9 11/10/2022   MCV 90.4 11/10/2022   PLT 250 11/10/2022   Lab Results  Component Value Date   NA 134 (L) 11/10/2022   K 3.2 (L) 11/10/2022   CL 97 (L) 11/10/2022   CO2 30 11/10/2022   Lab Results  Component Value Date   ALT 9 06/15/2022   AST 14 (L) 06/15/2022   ALKPHOS 73 06/15/2022   BILITOT 0.5 06/15/2022     RADIOGRAPHY: DG CHEST PORT 1 VIEW  Result Date: 11/10/2022 CLINICAL DATA:  Post bronchoscopy. EXAM: PORTABLE CHEST 1 VIEW COMPARISON:  Radiographs 07/21/2022 and 09/20/2019.  CT 10/26/2022. FINDINGS: 0922 hours. The heart size and mediastinal contours are stable. The lungs are hyperinflated. Known spiculated nodule posteriorly in the right middle lobe adjacent to a fiducial marker is grossly unchanged. There are stable postsurgical changes in the left perihilar region. No significant pulmonary hemorrhage, pneumothorax or pleural effusion identified. Skin folds overlie the chest bilaterally. No acute osseous findings are evident. IMPRESSION: No  evidence of complication following bronchoscopy. Known spiculated nodule in the right middle lobe adjacent to a fiducial marker is grossly unchanged. Electronically Signed   By: Carey Bullocks M.D.   On: 11/10/2022 09:53   DG C-ARM BRONCHOSCOPY  Result  Date: 11/10/2022 C-ARM BRONCHOSCOPY: Fluoroscopy was utilized by the requesting physician.  No radiographic interpretation.   CT CHEST WO CONTRAST  Result Date: 10/27/2022 CLINICAL DATA:  Pulmonary nodule. EXAM: CT CHEST WITHOUT CONTRAST TECHNIQUE: Multidetector CT imaging of the chest was performed following the standard protocol without IV contrast. RADIATION DOSE REDUCTION: This exam was performed according to the departmental dose-optimization program which includes automated exposure control, adjustment of the mA and/or kV according to patient size and/or use of iterative reconstruction technique. COMPARISON:  06/12/2022 FINDINGS: Cardiovascular: The heart size is normal. No substantial pericardial effusion. Coronary artery calcification is evident. Mild atherosclerotic calcification is noted in the wall of the thoracic aorta. Mediastinum/Nodes: No mediastinal lymphadenopathy. No evidence for gross hilar lymphadenopathy although assessment is limited by the lack of intravenous contrast on the current study. Dense calcification again noted in the mid thoracic esophagus. There is no axillary lymphadenopathy. Lungs/Pleura: Centrilobular emphsyema noted. 11 x 10 mm nodule in the posterior right middle lobe previously is 11 x 10 mm again today on image 76/5. This is immediately adjacent to a air cyst and there is new soft tissue thickening along the lateral wall the air cyst, extending posteriorly from the spiculated nodule (compare image 75/5 today to image 73/5 previously). Post treatment changes in the parahilar left lung are stable. No new suspicious pulmonary nodule or mass. No focal airspace consolidation. No pleural effusion. Upper Abdomen: Stable tiny  hypodensities in the liver parenchyma are compatible with cyst. No followup imaging is recommended. Musculoskeletal: No worrisome lytic or sclerotic osseous abnormality. IMPRESSION: 1. Continued progression of the 11 x 10 mm posterior right middle lobe pulmonary nodule. There is new soft tissue thickening along the lateral wall an immediately adjacent air cyst, extending posteriorly from the spiculated nodule. Neoplasm remains a concern. 2. Stable post treatment changes in the parahilar left lung. 3.  Emphysema (ICD10-J43.9) and Aortic Atherosclerosis (ICD10-170.0) Electronically Signed   By: Kennith Center M.D.   On: 10/27/2022 12:02      IMPRESSION/PLAN: 1. 67 y.o. man with ***  This is a very complex and interesting case.  It appears that the right middle lung nodule that was definitively treated with stereotactic body radiotherapy in 2020 remains visible with some enlargement around the edges but low SUV.  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  Given the complexity of the imaging follow-up, it seems that there was some consideration that the nodule may represent a metachronous new primary cancer.  However, after rigorous review and comparison against the planning CT imaging stored in the radiation oncology medical record, which is not available to diagnostic radiology or other specialists, today we felt that the most likely consideration was that the right middle lung nodule has been treated and currently remains controlled.  The low metabolism and negative biopsies both support that potential interpretation.  For this reason, I would not recommend SBRT at this time.  I would favor continued radiographic follow-up with periodic CTs.  The slight mixup in the treatment records reflects the disconnect between our radiation oncology imaging system and diagnostic radiology.  For this reason, we are happy to avail ourselves for additional follow-up questions as they may  arise.  We reviewed this situation with the patient today and he was relieved that we feel he does not have an active cancer based on the available information.      Marguarite Arbour, PA-C    Margaretmary Dys, MD  [facility]  Radiation Oncology Direct  Dial: [phone]  Fax: [phone] [email or web address]  Skype  [email or web address]   This document serves as a record of services personally performed by [name], MD and Freeman Caldron, PA-C. It was created on their behalf by Wilburn Mylar, a trained medical scribe. The creation of this record is based on the scribe's personal observations and the provider's statements to them. This document has been checked and approved by the attending provider.

## 2022-11-24 ENCOUNTER — Ambulatory Visit: Payer: Medicare Other | Admitting: Radiation Oncology

## 2022-12-04 ENCOUNTER — Encounter: Payer: Self-pay | Admitting: Acute Care

## 2022-12-04 ENCOUNTER — Ambulatory Visit (INDEPENDENT_AMBULATORY_CARE_PROVIDER_SITE_OTHER): Payer: 59 | Admitting: Acute Care

## 2022-12-04 VITALS — BP 110/64 | HR 62 | Temp 97.6°F | Ht 65.0 in | Wt 115.8 lb

## 2022-12-04 DIAGNOSIS — C342 Malignant neoplasm of middle lobe, bronchus or lung: Secondary | ICD-10-CM

## 2022-12-04 DIAGNOSIS — J449 Chronic obstructive pulmonary disease, unspecified: Secondary | ICD-10-CM | POA: Diagnosis not present

## 2022-12-04 NOTE — Progress Notes (Signed)
History of Present Illness Kyle Hersch. is a 67 y.o. male  ( Quit 2015 with a 35 pack year smoking history) with history of lung cancer  in 2015 , post SBRT, and concern for recurrence after surveillance PET scan 06/25/2022. He was referred to Dr. Valeta Harms for biopsy of nodule of concern.He underwent Flexible video fiberoptic bronchoscopy with robotic assistance and biopsies per Dr. Valeta Harms on 07/21/2022 which was negative for malignancy. Follow up imaging showed continued growth, and patient underwent a repeat bronch 11/10/2022. He is here today for follow up.    12/04/2022 Pt. Presents for follow up after repeat biopsy.He underwent repeat bronch 11/10/2022.He states he is doing well. He denies any bleeding or issues after the procedure. He has returned to his baseline Biopsy cytology  returned as atypical cells suggestive of malignancy.. This is the second biopsy  with atypical cells from  this lesion.The patient was seen by Dr. Tammi Klippel for consideration of SBRT treatment to the area of concern. Please refer to Dr. Johny Shears note dated 11/20/2022, as he has reviewed additional imaging which is not available to diagnostic radiology or other specialists and feels the most likely consideration was that the right middle lung nodule has been treated and currently remains controlled. The low metabolism and negative biopsies both support that potential interpretation. He did not recommend SBRT, but continued imaging as surveillance. Follow up CT Chest has been ordered for 01/2023.  Pt. States his breathing is good. Much better since starting Breztri. He is compliant daily, and he is has medication.   Test Results: A. LUNG, RML, FINE NEEDLE ASPIRATION:  - No malignant cells identified   B. LUNG, RML, BRUSH:  - Rare atypical cells present  - See comment   CT Chest without Contrast 10/26/2022 11 x 10 mm nodule in the posterior right middle lobe previously is 11 x 10 mm again today on image 76/5. This is  immediately adjacent to a air cyst and there is new soft tissue thickening along the lateral wall the air cyst, extending posteriorly from the spiculated nodule (compare image 75/5 today to image 73/5 previously). Post treatment changes in the parahilar left lung are stable. No new suspicious pulmonary nodule or mass. No focal airspace consolidation. No pleural effusion.   Continued progression of the 11 x 10 mm posterior right middle lobe pulmonary nodule. There is new soft tissue thickening along the lateral wall an immediately adjacent air cyst, extending posteriorly from the spiculated nodule. Neoplasm remains a concern. 2. Stable post treatment changes in the parahilar left lung. 3.  Emphysema (ICD10-J43.9) and Aortic Atherosclerosis (ICD10-170.0)   07/21/2022 Cytology Biopsy RUL mass A. LUNG, RUL MASS, NEEDLE  BIOPSY FORCEPS:  - No malignant cells identified, see comment   B. LUNG, RUL MASS, BRUSHING:  - No malignant cells identified, see comment      Latest Ref Rng & Units 11/10/2022    6:49 AM 07/21/2022    5:47 AM 06/15/2022   11:51 AM  CBC  WBC 4.0 - 10.5 K/uL 5.6  5.4  5.9   Hemoglobin 13.0 - 17.0 g/dL 15.4  14.6  14.4   Hematocrit 39.0 - 52.0 % 46.9  45.6  43.3   Platelets 150 - 400 K/uL 250  268  332        Latest Ref Rng & Units 11/10/2022    6:49 AM 07/21/2022    5:47 AM 06/15/2022   11:51 AM  BMP  Glucose 70 - 99 mg/dL 88  95  90   BUN 8 - 23 mg/dL 9  10  10    Creatinine 0.61 - 1.24 mg/dL 0.74  0.80  0.63   Sodium 135 - 145 mmol/L 134  137  138   Potassium 3.5 - 5.1 mmol/L 3.2  3.4  3.9   Chloride 98 - 111 mmol/L 97  100  101   CO2 22 - 32 mmol/L 30  30  34   Calcium 8.9 - 10.3 mg/dL 9.0  9.1  9.2     BNP No results found for: "BNP"  ProBNP No results found for: "PROBNP"  PFT    Component Value Date/Time   FEV1PRE 1.25 09/26/2014 0841   FEV1POST 1.67 09/26/2014 0841   FVCPRE 3.06 09/26/2014 0841   FVCPOST 3.56 09/26/2014 0841   TLC 7.51 09/26/2014  0841   DLCOUNC 20.25 09/26/2014 0841   PREFEV1FVCRT 41 09/26/2014 0841   PSTFEV1FVCRT 47 09/26/2014 0841    DG CHEST PORT 1 VIEW  Result Date: 11/10/2022 CLINICAL DATA:  Post bronchoscopy. EXAM: PORTABLE CHEST 1 VIEW COMPARISON:  Radiographs 07/21/2022 and 09/20/2019.  CT 10/26/2022. FINDINGS: 0922 hours. The heart size and mediastinal contours are stable. The lungs are hyperinflated. Known spiculated nodule posteriorly in the right middle lobe adjacent to a fiducial marker is grossly unchanged. There are stable postsurgical changes in the left perihilar region. No significant pulmonary hemorrhage, pneumothorax or pleural effusion identified. Skin folds overlie the chest bilaterally. No acute osseous findings are evident. IMPRESSION: No evidence of complication following bronchoscopy. Known spiculated nodule in the right middle lobe adjacent to a fiducial marker is grossly unchanged. Electronically Signed   By: Richardean Sale M.D.   On: 11/10/2022 09:53   DG C-ARM BRONCHOSCOPY  Result Date: 11/10/2022 C-ARM BRONCHOSCOPY: Fluoroscopy was utilized by the requesting physician.  No radiographic interpretation.     Past medical hx Past Medical History:  Diagnosis Date   Chronic mastoiditis, bilateral    COPD (chronic obstructive pulmonary disease) (HCC)    Hearing loss    Hypertension    Lung cancer ([place])    Pneumothorax, left    History of post op   Prostate cancer ([place])    Shortness of breath dyspnea    on exertion   Spinal stenosis      Social History   Tobacco Use   Smoking status: Former    Packs/day: 1.00    Years: 35.00    Total pack years: 35.00    Types: Cigarettes    Quit date: 05/11/2014    Years since quitting: 8.5   Smokeless tobacco: Never  Vaping Use   Vaping Use: Never used  Substance Use Topics   Alcohol use: No   Drug use: No    Kyle Hamilton reports that he quit smoking about 8 years ago. His smoking use included cigarettes. He has a 35.00 pack-year smoking  history. He has never used smokeless tobacco. He reports that he does not drink alcohol and does not use drugs.  Tobacco Cessation: Former smoker, quit 2015 with a 35 pack year smoking history   Past surgical hx, Family hx, Social hx all reviewed.  Current Outpatient Medications on File Prior to Visit  Medication Sig   albuterol (VENTOLIN HFA) 108 (90 Base) MCG/ACT inhaler Inhale 2 puffs into the lungs every 6 (six) hours as needed for wheezing or shortness of breath.   Budeson-Glycopyrrol-Formoterol (BREZTRI AEROSPHERE) 160-9-4.8 MCG/ACT AERO Inhale 2 puffs into the lungs in the morning and at bedtime.  budesonide-formoterol (SYMBICORT) 160-4.5 MCG/ACT inhaler Inhale 2 puffs into the lungs every evening.   hydrochlorothiazide (MICROZIDE) 12.5 MG capsule Take 12.5 mg by mouth daily.   No current facility-administered medications on file prior to visit.     No Known Allergies  Review Of Systems:  Constitutional:   No  weight loss, night sweats,  Fevers, chills, fatigue, or  lassitude.  HEENT:   No headaches,  Difficulty swallowing,  Tooth/dental problems, or  Sore throat,                No sneezing, itching, ear ache, nasal congestion, post nasal drip,   CV:  No chest pain,  Orthopnea, PND, swelling in lower extremities, anasarca, dizziness, palpitations, syncope.   GI  No heartburn, indigestion, abdominal pain, nausea, vomiting, diarrhea, change in bowel habits, loss of appetite, bloody stools.   Resp: No shortness of breath with exertion or at rest.  No excess mucus, no productive cough,  No non-productive cough,  No coughing up of blood.  No change in color of mucus.  No wheezing.  No chest wall deformity  Skin: no rash or lesions.  GU: no dysuria, change in color of urine, no urgency or frequency.  No flank pain, no hematuria   MS:  No joint pain or swelling.  No decreased range of motion.  No back pain.  Psych:  No change in mood or affect. No depression or anxiety.  No  memory loss.   Vital Signs BP 110/64   Pulse 62   Temp 97.6 F (36.4 C) (Oral)   Ht 5\' 5"  (1.651 m)   Wt 115 lb 12.8 oz (52.5 kg)   SpO2 94%   BMI 19.27 kg/m    Physical Exam:  General- No distress,  A&Ox3, pleasant ENT: No sinus tenderness, TM clear, pale nasal mucosa, no oral exudate,no post nasal drip, no LAN Cardiac: S1, S2, regular rate and rhythm, no murmur Chest: No wheeze/ rales/ dullness; no accessory muscle use, no nasal flaring, no sternal retractions Abd.: Soft Non-tender, ND, BS +, Body mass index is 19.27 kg/m.  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 Continue Breztri 2 puff in the morning and 2 puffs in the evening. Rinse mouth after use.  Continue Albuterol as rescue, use as needed for shortness of breath or wheezing Follow up in March 2024 after CT Chest with Judson Roch NP or Dr. Valeta Harms.  Call if you need [place] sooner. Note your daily symptoms > remember "red flags" for COPD:  Increase in cough, increase in sputum production, increase in shortness of breath or activity intolerance. If you notice these symptoms, please call to be seen.    Please contact office for sooner follow up if symptoms do not improve or worsen or seek emergency care    I spent 35 minutes dedicated to the care of this patient on the date of this encounter to include pre-visit review of records, face-to-face time with the patient discussing conditions above, post visit ordering of testing, clinical documentation with the electronic health record, making appropriate  referrals as  documented, and communicating necessary information to the patient's healthcare team.    Magdalen Spatz, NP 12/04/2022  10:55 AM

## 2022-12-04 NOTE — Patient Instructions (Addendum)
It is good to see you today. Follow up CT Chest is ordered for 01/2023. Continue Breztri 2 puff in the morning and 2 puffs in the evening. Rinse mouth after use.  Continue Albuterol as rescue, use as needed for shortness of breath or wheezing Follow up in March 2024 with Lorene Klimas NP or Dr. Valeta Harms.  Call if you need [place] sooner. Note your daily symptoms > remember "red flags" for COPD:  Increase in cough, increase in sputum production, increase in shortness of breath or activity intolerance. If you notice these symptoms, please call to be seen.    Please contact office for sooner follow up if symptoms do not improve or worsen or seek emergency care

## 2022-12-16 DIAGNOSIS — E559 Vitamin D deficiency, unspecified: Secondary | ICD-10-CM | POA: Diagnosis not present

## 2022-12-16 DIAGNOSIS — E782 Mixed hyperlipidemia: Secondary | ICD-10-CM | POA: Diagnosis not present

## 2022-12-16 DIAGNOSIS — J439 Emphysema, unspecified: Secondary | ICD-10-CM | POA: Diagnosis not present

## 2022-12-16 DIAGNOSIS — I10 Essential (primary) hypertension: Secondary | ICD-10-CM | POA: Diagnosis not present

## 2022-12-16 DIAGNOSIS — R918 Other nonspecific abnormal finding of lung field: Secondary | ICD-10-CM | POA: Diagnosis not present

## 2022-12-16 DIAGNOSIS — Z0000 Encounter for general adult medical examination without abnormal findings: Secondary | ICD-10-CM | POA: Diagnosis not present

## 2022-12-16 DIAGNOSIS — J449 Chronic obstructive pulmonary disease, unspecified: Secondary | ICD-10-CM | POA: Diagnosis not present

## 2022-12-16 DIAGNOSIS — Z1211 Encounter for screening for malignant neoplasm of colon: Secondary | ICD-10-CM | POA: Diagnosis not present

## 2022-12-16 DIAGNOSIS — Z681 Body mass index (BMI) 19 or less, adult: Secondary | ICD-10-CM | POA: Diagnosis not present

## 2022-12-16 DIAGNOSIS — I700 Atherosclerosis of aorta: Secondary | ICD-10-CM | POA: Diagnosis not present

## 2023-01-14 ENCOUNTER — Telehealth: Payer: Self-pay | Admitting: Internal Medicine

## 2023-01-14 NOTE — Telephone Encounter (Signed)
Called patient regarding March/April appointments, left a voicemail.

## 2023-02-04 ENCOUNTER — Other Ambulatory Visit: Payer: Self-pay

## 2023-02-04 ENCOUNTER — Ambulatory Visit (HOSPITAL_COMMUNITY)
Admission: RE | Admit: 2023-02-04 | Discharge: 2023-02-04 | Disposition: A | Discharge status: Home / Self Care | Payer: 59 | Source: Ambulatory Visit | Attending: Physician Assistant | Admitting: Physician Assistant

## 2023-02-04 ENCOUNTER — Encounter (HOSPITAL_COMMUNITY): Payer: Self-pay

## 2023-02-04 ENCOUNTER — Inpatient Hospital Stay: Payer: 59 | Attending: Internal Medicine

## 2023-02-04 DIAGNOSIS — Z85118 Personal history of other malignant neoplasm of bronchus and lung: Secondary | ICD-10-CM | POA: Insufficient documentation

## 2023-02-04 DIAGNOSIS — C342 Malignant neoplasm of middle lobe, bronchus or lung: Secondary | ICD-10-CM | POA: Insufficient documentation

## 2023-02-04 DIAGNOSIS — C3490 Malignant neoplasm of unspecified part of unspecified bronchus or lung: Secondary | ICD-10-CM | POA: Diagnosis not present

## 2023-02-04 DIAGNOSIS — Z923 Personal history of irradiation: Secondary | ICD-10-CM | POA: Diagnosis not present

## 2023-02-04 DIAGNOSIS — J439 Emphysema, unspecified: Secondary | ICD-10-CM | POA: Diagnosis not present

## 2023-02-04 DIAGNOSIS — R918 Other nonspecific abnormal finding of lung field: Secondary | ICD-10-CM | POA: Diagnosis not present

## 2023-02-04 LAB — CBC WITH DIFFERENTIAL (CANCER CENTER ONLY)
Abs Immature Granulocytes: 0.01 10*3/uL (ref 0.00–0.07)
Basophils Absolute: 0 10*3/uL (ref 0.0–0.1)
Basophils Relative: 0 %
Eosinophils Absolute: 0.1 10*3/uL (ref 0.0–0.5)
Eosinophils Relative: 3 %
HCT: 44.9 % (ref 39.0–52.0)
Hemoglobin: 14.5 g/dL (ref 13.0–17.0)
Immature Granulocytes: 0 %
Lymphocytes Relative: 32 %
Lymphs Abs: 1.6 10*3/uL (ref 0.7–4.0)
MCH: 29.4 pg (ref 26.0–34.0)
MCHC: 32.3 g/dL (ref 30.0–36.0)
MCV: 91.1 fL (ref 80.0–100.0)
Monocytes Absolute: 0.4 10*3/uL (ref 0.1–1.0)
Monocytes Relative: 9 %
Neutro Abs: 2.8 10*3/uL (ref 1.7–7.7)
Neutrophils Relative %: 56 %
Platelet Count: 227 10*3/uL (ref 150–400)
RBC: 4.93 MIL/uL (ref 4.22–5.81)
RDW: 13.3 % (ref 11.5–15.5)
WBC Count: 4.9 10*3/uL (ref 4.0–10.5)
nRBC: 0 % (ref 0.0–0.2)

## 2023-02-04 LAB — CMP (CANCER CENTER ONLY)
ALT: 12 U/L (ref 0–44)
AST: 17 U/L (ref 15–41)
Albumin: 4 g/dL (ref 3.5–5.0)
Alkaline Phosphatase: 64 U/L (ref 38–126)
Anion gap: 6 (ref 5–15)
BUN: 17 mg/dL (ref 8–23)
CO2: 35 mmol/L — ABNORMAL HIGH (ref 22–32)
Calcium: 9.3 mg/dL (ref 8.9–10.3)
Chloride: 101 mmol/L (ref 98–111)
Creatinine: 0.74 mg/dL (ref 0.61–1.24)
GFR, Estimated: >60 mL/min (ref >60)
Glucose, Bld: 123 mg/dL — ABNORMAL HIGH (ref 70–99)
Potassium: 3.6 mmol/L (ref 3.5–5.1)
Sodium: 142 mmol/L (ref 135–145)
Total Bilirubin: 0.6 mg/dL (ref 0.3–1.2)
Total Protein: 6.6 g/dL (ref 6.5–8.1)

## 2023-02-04 MED ORDER — IOHEXOL 300 MG/ML  SOLN
75.0000 mL | Freq: Once | INTRAMUSCULAR | Status: AC | PRN
  Administered 2023-02-04: 75 mL via INTRAVENOUS

## 2023-02-04 MED ORDER — SODIUM CHLORIDE (PF) 0.9 % IJ SOLN
INTRAMUSCULAR | Status: AC
  Filled 2023-02-04: qty 50

## 2023-02-08 ENCOUNTER — Encounter: Payer: Self-pay | Admitting: Internal Medicine

## 2023-02-08 ENCOUNTER — Other Ambulatory Visit: Payer: Self-pay

## 2023-02-08 ENCOUNTER — Inpatient Hospital Stay: Payer: 59 | Attending: Internal Medicine | Admitting: Internal Medicine

## 2023-02-08 VITALS — BP 130/78 | HR 61 | Temp 98.2°F | Resp 16 | Wt 114.8 lb

## 2023-02-08 DIAGNOSIS — C3490 Malignant neoplasm of unspecified part of unspecified bronchus or lung: Secondary | ICD-10-CM | POA: Diagnosis not present

## 2023-02-08 DIAGNOSIS — Z85118 Personal history of other malignant neoplasm of bronchus and lung: Secondary | ICD-10-CM | POA: Insufficient documentation

## 2023-02-08 DIAGNOSIS — Z923 Personal history of irradiation: Secondary | ICD-10-CM | POA: Insufficient documentation

## 2023-02-08 NOTE — Progress Notes (Signed)
[facility] [phone]   [phone]  OFFICE PROGRESS NOTE  Cari Caraway, MD [place] [address]  DIAGNOSIS:   1) stage Ia (T1b, N0, M0) non-small cell lung cancer, adenocarcinoma with positive EGFR mutation in exon 21 [id number]) diagnosed in December 2015. 2) Recurrent disease presenting as stage Ia involving the right middle lobe.  PRIOR THERAPY: 1)  status post left lower lobe wedge resection under the care of Dr. Darcey Nora. 2)  status post SBRT in 2020 under the care of Dr. Tammi Klippel.   CURRENT THERAPY: Observation  INTERVAL HISTORY: Kyle Hamilton. 67 y.o. male returns to the clinic today for follow-up visit.  The patient is feeling fine today with no concerning complaints except for the dizzy spells yesterday and sweating during an Easter party but he felt much better later on.  He denied having any current chest pain, shortness of breath, cough or hemoptysis.  He has no nausea, vomiting, diarrhea or constipation.  He has no headache or visual changes.  He has no recent weight loss or night sweats.  He is here today for evaluation with repeat CT scan of the chest for restaging of his disease.  MEDICAL HISTORY: Past Medical History:  Diagnosis Date   Chronic mastoiditis, bilateral    COPD (chronic obstructive pulmonary disease) (HCC)    Hearing loss    Hypertension    Lung cancer ([place])    Pneumothorax, left    History of post op   Prostate cancer ([place])    Shortness of breath dyspnea    on exertion   Spinal stenosis     ALLERGIES:  has No Known Allergies.  MEDICATIONS:  Current Outpatient Medications  Medication Sig Dispense Refill   albuterol (VENTOLIN HFA) 108 (90 Base) MCG/ACT inhaler Inhale 2 puffs into the lungs every 6 (six) hours as needed for wheezing or shortness of breath. 8 g 3   Budeson-Glycopyrrol-Formoterol (BREZTRI AEROSPHERE) 160-9-4.8 MCG/ACT AERO Inhale 2 puffs into the lungs in the morning and  at bedtime. 1 each 2   budesonide-formoterol (SYMBICORT) 160-4.5 MCG/ACT inhaler Inhale 2 puffs into the lungs every evening. 1 each 6   hydrochlorothiazide (MICROZIDE) 12.5 MG capsule Take 12.5 mg by mouth daily.  1   No current facility-administered medications for this visit.    SURGICAL HISTORY:  Past Surgical History:  Procedure Laterality Date   BAHA REVISION Right 08/30/2014   Procedure: BONE ANCHORED HEARING AID (BAHA) REVISION RIGHT SIDE;  Surgeon: Izora Gala, MD;  Location: [facility];  Service: ENT;  Laterality: Right;   BONE ANCHORED HEARING AID IMPLANT  08/03/2012   Procedure: BONE ANCHORED HEARING AID (BAHA) IMPLANT;  Surgeon: Izora Gala, MD;  Location: [facility];  Service: ENT;  Laterality: Right;   BRONCHIAL BIOPSY  07/21/2022   Procedure: BRONCHIAL BIOPSIES;  Surgeon: Garner Nash, DO;  Location: [facility];  Service: Pulmonary;;   BRONCHIAL BIOPSY  11/10/2022   Procedure: BRONCHIAL BIOPSIES;  Surgeon: Garner Nash, DO;  Location: [facility];  Service: Pulmonary;;   BRONCHIAL BRUSHINGS  07/21/2022   Procedure: BRONCHIAL BRUSHINGS;  Surgeon: Garner Nash, DO;  Location: [facility];  Service: Pulmonary;;   BRONCHIAL BRUSHINGS  11/10/2022   Procedure: BRONCHIAL BRUSHINGS;  Surgeon: Garner Nash, DO;  Location: [facility];  Service: Pulmonary;;   BRONCHIAL NEEDLE ASPIRATION BIOPSY  07/21/2022   Procedure: BRONCHIAL NEEDLE ASPIRATION BIOPSIES;  Surgeon: Garner Nash, DO;  Location: [facility];  Service: Pulmonary;;   BRONCHIAL NEEDLE ASPIRATION BIOPSY  11/10/2022   Procedure: BRONCHIAL NEEDLE ASPIRATION BIOPSIES;  Surgeon: Garner Nash, DO;  Location: [facility];  Service: Pulmonary;;   COLONOSCOPY  2007   CYSTOSCOPY  03/25/2018   Procedure: CYSTOSCOPY;  Surgeon: Kathie Rhodes, MD;  Location: [facility];  Service: Urology;;  no seeds noted in bladder   FIDUCIAL MARKER PLACEMENT  07/21/2022   Procedure: FIDUCIAL MARKER PLACEMENT;  Surgeon:  Garner Nash, DO;  Location: [facility];  Service: Pulmonary;;   LEG SURGERY     right leg deep cut   MOUTH SURGERY     PROSTATE BIOPSY     RADIOACTIVE SEED IMPLANT N/A 03/25/2018   Procedure: RADIOACTIVE SEED IMPLANT/BRACHYTHERAPY IMPLANT;  Surgeon: Kathie Rhodes, MD;  Location: [facility];  Service: Urology;  Laterality: N/A;  71 seeds implanted   SPACE OAR INSTILLATION N/A 03/25/2018   Procedure: SPACE OAR INSTILLATION;  Surgeon: Kathie Rhodes, MD;  Location: [facility];  Service: Urology;  Laterality: N/A;   STAPEDES SURGERY     bilateral   VIDEO ASSISTED THORACOSCOPY (VATS)/ LOBECTOMY Left 10/09/2014   Procedure: VIDEO ASSISTED THORACOSCOPY (VATS)/ LOBECTOMY;  Surgeon: Ivin Poot, MD;  Location: [facility] OR;  Service: Thoracic;  Laterality: Left;    REVIEW OF SYSTEMS:  A comprehensive review of systems was negative.   PHYSICAL EXAMINATION: General appearance: alert, cooperative, and no distress Head: Normocephalic, without obvious abnormality, atraumatic Neck: no adenopathy, no JVD, supple, symmetrical, trachea midline, and thyroid not enlarged, symmetric, no tenderness/mass/nodules Lymph nodes: Cervical, supraclavicular, and axillary nodes normal. Resp: clear to auscultation bilaterally Back: symmetric, no curvature. ROM normal. No CVA tenderness. Cardio: regular rate and rhythm, S1, S2 normal, no murmur, click, rub or gallop GI: soft, non-tender; bowel sounds normal; no masses,  no organomegaly Extremities: extremities normal, atraumatic, no cyanosis or edema  ECOG PERFORMANCE STATUS: 1 - Symptomatic but completely ambulatory  Blood pressure 130/78, pulse 61, temperature 98.2 F (36.8 C), temperature source Oral, resp. rate 16, weight 114 lb 12.8 oz (52.1 kg), SpO2 95 %.  LABORATORY DATA: Lab Results  Component Value Date   WBC 4.9 02/04/2023   HGB 14.5 02/04/2023   HCT 44.9 02/04/2023   MCV 91.1 02/04/2023   PLT 227 02/04/2023       Chemistry      Component Value Date/Time   NA 142 02/04/2023 1055   K 3.6 02/04/2023 1055   CL 101 02/04/2023 1055   CO2 35 (H) 02/04/2023 1055   BUN 17 02/04/2023 1055   CREATININE 0.74 02/04/2023 1055   CREATININE 0.71 11/24/2019 1327      Component Value Date/Time   CALCIUM 9.3 02/04/2023 1055   ALKPHOS 64 02/04/2023 1055   AST 17 02/04/2023 1055   ALT 12 02/04/2023 1055   BILITOT 0.6 02/04/2023 1055       RADIOGRAPHIC STUDIES: CT Chest W Contrast  Result Date: 02/05/2023 CLINICAL DATA:  Non-small-cell lung cancer. Restaging. * Tracking Code: BO * EXAM: CT CHEST WITH CONTRAST TECHNIQUE: Multidetector CT imaging of the chest was performed during intravenous contrast administration. RADIATION DOSE REDUCTION: This exam was performed according to the departmental dose-optimization program which includes automated exposure control, adjustment of the mA and/or kV according to patient size and/or use of iterative reconstruction technique. CONTRAST:  23mL OMNIPAQUE IOHEXOL 300 MG/ML  SOLN COMPARISON:  10/26/2022 FINDINGS: Cardiovascular: The heart size is normal. No substantial pericardial effusion. Mild atherosclerotic calcification is noted in  the wall of the thoracic aorta. Mediastinum/Nodes: No mediastinal lymphadenopathy. Dense calcification again noted along the posterior wall of the midthoracic esophagus, stable. There is no hilar lymphadenopathy. There is no axillary lymphadenopathy. Lungs/Pleura: Centrilobular emphsyema noted. Posterior right middle lobe spiculated nodule again identified not substantially changed at 12 x 10 mm today compared to 11 x 10 mm previously. Posterior finger-like projection of soft tissue from this nodule along the margin of a air cyst is stable. Adjacent fiducial marker again noted. Surgical scarring again noted in the posterior left lung, stable. No new suspicious pulmonary nodule or mass. No focal airspace consolidation. There is no evidence of pleural  effusion. Upper Abdomen: Several tiny hypoattenuating lesions in the dome of the liver are too small to characterize but remains stable in the interval, likely benign. No followup imaging is recommended. Musculoskeletal: No worrisome lytic or sclerotic osseous abnormality. IMPRESSION: 1. Stable exam. No new or progressive findings. 2. No substantial change in appearance of the posterior right middle lobe spiculated nodule with adjacent fiducial marker. 3.  Aortic Atherosclerosis (ICD10-I70.0). Electronically Signed   By: Misty Stanley M.D.   On: 02/05/2023 11:43    ASSESSMENT AND PLAN: This is a very pleasant 67 years old white male with history of stage Ia (T1b, N0, M0) non-small cell lung cancer, adenocarcinoma with positive EGFR mutation in exon 21 [id number]) diagnosed in December 2015 status post left lower lobe wedge resection under the care of Dr. Darcey Nora. The patient also had recurrent disease presenting as stage Ia involving the right middle lobe status post SBRT in 2020 under the care of Dr. Tammi Klippel. The patient is currently on observation and he is feeling fine with no concerning complaints. He had repeat CT scan of the chest performed recently.  I personally and independently reviewed the scan and discussed the result with the patient today. There is no clear evidence for disease progression. I recommended for him to continue on observation with repeat CT scan of the chest in 6 months. The patient was advised to call immediately if he has any concerning symptoms in the interval. The patient voices understanding of current disease status and treatment options and is in agreement with the current care plan.  All questions were answered. The patient knows to call the clinic with any problems, questions or concerns. We can certainly see the patient much sooner if necessary.  The total time spent in the appointment was 20 minutes.  Disclaimer: This note was dictated with voice recognition  software. Similar sounding words can inadvertently be transcribed and may not be corrected upon review.

## 2023-02-09 ENCOUNTER — Telehealth: Payer: Self-pay | Admitting: Internal Medicine

## 2023-02-09 NOTE — Telephone Encounter (Signed)
Scheduled per 04/01 los, patient has been called and voicemail was left. 

## 2023-03-19 DIAGNOSIS — E782 Mixed hyperlipidemia: Secondary | ICD-10-CM | POA: Diagnosis not present

## 2023-03-19 DIAGNOSIS — E559 Vitamin D deficiency, unspecified: Secondary | ICD-10-CM | POA: Diagnosis not present

## 2023-04-30 ENCOUNTER — Other Ambulatory Visit: Payer: Self-pay | Admitting: Acute Care

## 2023-04-30 DIAGNOSIS — J449 Chronic obstructive pulmonary disease, unspecified: Secondary | ICD-10-CM

## 2023-06-22 DIAGNOSIS — E559 Vitamin D deficiency, unspecified: Secondary | ICD-10-CM | POA: Diagnosis not present

## 2023-08-09 ENCOUNTER — Inpatient Hospital Stay: Payer: 59 | Attending: Internal Medicine

## 2023-08-09 ENCOUNTER — Ambulatory Visit (HOSPITAL_COMMUNITY)
Admission: RE | Admit: 2023-08-09 | Discharge: 2023-08-09 | Disposition: A | Discharge status: Home / Self Care | Payer: 59 | Source: Ambulatory Visit | Attending: Internal Medicine | Admitting: Internal Medicine

## 2023-08-09 DIAGNOSIS — R0609 Other forms of dyspnea: Secondary | ICD-10-CM | POA: Diagnosis not present

## 2023-08-09 DIAGNOSIS — J432 Centrilobular emphysema: Secondary | ICD-10-CM | POA: Diagnosis not present

## 2023-08-09 DIAGNOSIS — C342 Malignant neoplasm of middle lobe, bronchus or lung: Secondary | ICD-10-CM | POA: Insufficient documentation

## 2023-08-09 DIAGNOSIS — C3490 Malignant neoplasm of unspecified part of unspecified bronchus or lung: Secondary | ICD-10-CM

## 2023-08-09 DIAGNOSIS — R911 Solitary pulmonary nodule: Secondary | ICD-10-CM | POA: Insufficient documentation

## 2023-08-09 DIAGNOSIS — Z85118 Personal history of other malignant neoplasm of bronchus and lung: Secondary | ICD-10-CM | POA: Insufficient documentation

## 2023-08-09 DIAGNOSIS — Z902 Acquired absence of lung [part of]: Secondary | ICD-10-CM | POA: Insufficient documentation

## 2023-08-09 DIAGNOSIS — J449 Chronic obstructive pulmonary disease, unspecified: Secondary | ICD-10-CM | POA: Insufficient documentation

## 2023-08-09 DIAGNOSIS — Z9221 Personal history of antineoplastic chemotherapy: Secondary | ICD-10-CM | POA: Insufficient documentation

## 2023-08-09 LAB — CMP (CANCER CENTER ONLY)
ALT: 12 U/L (ref 0–44)
AST: 17 U/L (ref 15–41)
Albumin: 4 g/dL (ref 3.5–5.0)
Alkaline Phosphatase: 61 U/L (ref 38–126)
Anion gap: 5 (ref 5–15)
BUN: 15 mg/dL (ref 8–23)
CO2: 35 mmol/L — ABNORMAL HIGH (ref 22–32)
Calcium: 9.5 mg/dL (ref 8.9–10.3)
Chloride: 102 mmol/L (ref 98–111)
Creatinine: 0.69 mg/dL (ref 0.61–1.24)
GFR, Estimated: >60 mL/min (ref >60)
Glucose, Bld: 86 mg/dL (ref 70–99)
Potassium: 3.5 mmol/L (ref 3.5–5.1)
Sodium: 142 mmol/L (ref 135–145)
Total Bilirubin: 0.6 mg/dL (ref 0.3–1.2)
Total Protein: 6.5 g/dL (ref 6.5–8.1)

## 2023-08-09 LAB — CBC WITH DIFFERENTIAL (CANCER CENTER ONLY)
Abs Immature Granulocytes: 0.02 10*3/uL (ref 0.00–0.07)
Basophils Absolute: 0 10*3/uL (ref 0.0–0.1)
Basophils Relative: 0 %
Eosinophils Absolute: 0.2 10*3/uL (ref 0.0–0.5)
Eosinophils Relative: 3 %
HCT: 46.3 % (ref 39.0–52.0)
Hemoglobin: 14.9 g/dL (ref 13.0–17.0)
Immature Granulocytes: 0 %
Lymphocytes Relative: 35 %
Lymphs Abs: 1.8 10*3/uL (ref 0.7–4.0)
MCH: 29.4 pg (ref 26.0–34.0)
MCHC: 32.2 g/dL (ref 30.0–36.0)
MCV: 91.5 fL (ref 80.0–100.0)
Monocytes Absolute: 0.6 10*3/uL (ref 0.1–1.0)
Monocytes Relative: 11 %
Neutro Abs: 2.7 10*3/uL (ref 1.7–7.7)
Neutrophils Relative %: 51 %
Platelet Count: 241 10*3/uL (ref 150–400)
RBC: 5.06 MIL/uL (ref 4.22–5.81)
RDW: 13.2 % (ref 11.5–15.5)
WBC Count: 5.2 10*3/uL (ref 4.0–10.5)
nRBC: 0 % (ref 0.0–0.2)

## 2023-08-09 MED ORDER — IOHEXOL 300 MG/ML  SOLN
75.0000 mL | Freq: Once | INTRAMUSCULAR | Status: AC | PRN
  Administered 2023-08-09: 75 mL via INTRAVENOUS

## 2023-08-11 ENCOUNTER — Inpatient Hospital Stay: Payer: 59 | Attending: Internal Medicine | Admitting: Internal Medicine

## 2023-08-11 VITALS — BP 123/79 | HR 72 | Temp 97.7°F | Resp 16 | Ht 65.0 in | Wt 112.7 lb

## 2023-08-11 DIAGNOSIS — C342 Malignant neoplasm of middle lobe, bronchus or lung: Secondary | ICD-10-CM | POA: Diagnosis not present

## 2023-08-11 DIAGNOSIS — C3490 Malignant neoplasm of unspecified part of unspecified bronchus or lung: Secondary | ICD-10-CM

## 2023-08-11 DIAGNOSIS — Z923 Personal history of irradiation: Secondary | ICD-10-CM | POA: Diagnosis not present

## 2023-08-11 NOTE — Progress Notes (Signed)
[facility] [phone]   [phone]  OFFICE PROGRESS NOTE  [name], MD [address] [place] [address]  DIAGNOSIS:   1) stage Ia (T1b, N0, M0) non-small cell lung cancer, adenocarcinoma with positive EGFR mutation in exon 21 ([id number]) diagnosed in December 2015. 2) Recurrent disease presenting as stage Ia involving the right middle lobe.  PRIOR THERAPY: 1)  status post left lower lobe wedge resection under the care of Dr. Maren Beach. 2)  status post SBRT in 2020 under the care of Dr. Kathrynn Running.   CURRENT THERAPY: Observation  INTERVAL HISTORY: Kyle Hamilton. 67 y.o. male returns to the clinic today for 38-month follow-up visit.Discussed the use of AI scribe software for clinical note transcription with the patient, who gave verbal consent to proceed.  History of Present Illness   Kyle Hamilton, a 67 year old white male  with a history of stage IA (T1b, N0, M0) non-small cell lung cancer, adenocarcinoma with positive EGFR mutation in exon 21 ([id number]) diagnosed in December 2015., underwent a left lower lobectomy in 2015 for the initial disease. Subsequently, a nodule was identified in the right middle lobe of the lung, which was treated with stereotactic body radiation therapy (SBRT). Since then, he has been under regular surveillance with six-monthly follow-ups.  Recently, he reports feeling well overall with no significant complaints. He denies experiencing chest pain, shortness of breath (except on exertion after walking two to three blocks), cough, hemoptysis, nausea, vomiting, diarrhea, headache, or changes in vision. He has noticed a slight weight loss of about two pounds, which he attributes to his current eating habits of consuming only one large meal per day.  He has been prescribed an inhaler, Breztri, for his lung condition, and Albuterol for emergency use. He also mentions a past injury to his foot, which has healed but has left him  with an altered foot position. He does not report any other new symptoms or changes in his health status.       MEDICAL HISTORY: Past Medical History:  Diagnosis Date   Chronic mastoiditis, bilateral    COPD (chronic obstructive pulmonary disease) (HCC)    Hearing loss    Hypertension    Lung cancer (HCC)    Pneumothorax, left    History of post op   Prostate cancer (HCC)    Shortness of breath dyspnea    on exertion   Spinal stenosis     ALLERGIES:  has No Known Allergies.  MEDICATIONS:  Current Outpatient Medications  Medication Sig Dispense Refill   albuterol (VENTOLIN HFA) 108 (90 Base) MCG/ACT inhaler Inhale 2 puffs into the lungs every 6 (six) hours as needed for wheezing or shortness of breath. 8 g 3   Budeson-Glycopyrrol-Formoterol (BREZTRI AEROSPHERE) 160-9-4.8 MCG/ACT AERO INHALE 2 PUFFS INTO THE LUNGS IN THE MORNING AND AT BEDTIME 10.7 g 11   budesonide-formoterol (SYMBICORT) 160-4.5 MCG/ACT inhaler Inhale 2 puffs into the lungs every evening. 1 each 6   hydrochlorothiazide (MICROZIDE) 12.5 MG capsule Take 12.5 mg by mouth daily.  1   rosuvastatin (CRESTOR) 10 MG tablet Take 10 mg by mouth daily.     Vitamin D, Ergocalciferol, (DRISDOL) 1.25 MG (50000 UNIT) CAPS capsule Take 50,000 Units by mouth once a week.     No current facility-administered medications for this visit.    SURGICAL HISTORY:  Past Surgical History:  Procedure Laterality Date   BAHA REVISION Right 08/30/2014   Procedure: BONE ANCHORED HEARING AID (BAHA)  REVISION RIGHT SIDE;  Surgeon: Serena Colonel, MD;  Location: [facility] OR;  Service: ENT;  Laterality: Right;   BONE ANCHORED HEARING AID IMPLANT  08/03/2012   Procedure: BONE ANCHORED HEARING AID (BAHA) IMPLANT;  Surgeon: Serena Colonel, MD;  Location: [facility] OR;  Service: ENT;  Laterality: Right;   BRONCHIAL BIOPSY  07/21/2022   Procedure: BRONCHIAL BIOPSIES;  Surgeon: Josephine Igo, DO;  Location: [facility];  Service: Pulmonary;;   BRONCHIAL BIOPSY   11/10/2022   Procedure: BRONCHIAL BIOPSIES;  Surgeon: Josephine Igo, DO;  Location: [facility];  Service: Pulmonary;;   BRONCHIAL BRUSHINGS  07/21/2022   Procedure: BRONCHIAL BRUSHINGS;  Surgeon: Josephine Igo, DO;  Location: [facility];  Service: Pulmonary;;   BRONCHIAL BRUSHINGS  11/10/2022   Procedure: BRONCHIAL BRUSHINGS;  Surgeon: Josephine Igo, DO;  Location: [facility];  Service: Pulmonary;;   BRONCHIAL NEEDLE ASPIRATION BIOPSY  07/21/2022   Procedure: BRONCHIAL NEEDLE ASPIRATION BIOPSIES;  Surgeon: Josephine Igo, DO;  Location: [facility];  Service: Pulmonary;;   BRONCHIAL NEEDLE ASPIRATION BIOPSY  11/10/2022   Procedure: BRONCHIAL NEEDLE ASPIRATION BIOPSIES;  Surgeon: Josephine Igo, DO;  Location: [facility];  Service: Pulmonary;;   COLONOSCOPY  2007   CYSTOSCOPY  03/25/2018   Procedure: CYSTOSCOPY;  Surgeon: Ihor Gully, MD;  Location: [facility];  Service: Urology;;  no seeds noted in bladder   FIDUCIAL MARKER PLACEMENT  07/21/2022   Procedure: FIDUCIAL MARKER PLACEMENT;  Surgeon: Josephine Igo, DO;  Location: [facility];  Service: Pulmonary;;   LEG SURGERY     right leg deep cut   MOUTH SURGERY     PROSTATE BIOPSY     RADIOACTIVE SEED IMPLANT N/A 03/25/2018   Procedure: RADIOACTIVE SEED IMPLANT/BRACHYTHERAPY IMPLANT;  Surgeon: Ihor Gully, MD;  Location: [facility];  Service: Urology;  Laterality: N/A;  71 seeds implanted   SPACE OAR INSTILLATION N/A 03/25/2018   Procedure: SPACE OAR INSTILLATION;  Surgeon: Ihor Gully, MD;  Location: [facility];  Service: Urology;  Laterality: N/A;   STAPEDES SURGERY     bilateral   VIDEO ASSISTED THORACOSCOPY (VATS)/ LOBECTOMY Left 10/09/2014   Procedure: VIDEO ASSISTED THORACOSCOPY (VATS)/ LOBECTOMY;  Surgeon: Kerin Perna, MD;  Location: [facility] OR;  Service: Thoracic;  Laterality: Left;    REVIEW OF SYSTEMS:  A comprehensive review of systems was negative except for:  Musculoskeletal: positive for arthralgias   PHYSICAL EXAMINATION: General appearance: alert, cooperative, and no distress Head: Normocephalic, without obvious abnormality, atraumatic Neck: no adenopathy, no JVD, supple, symmetrical, trachea midline, and thyroid not enlarged, symmetric, no tenderness/mass/nodules Lymph nodes: Cervical, supraclavicular, and axillary nodes normal. Resp: clear to auscultation bilaterally Back: symmetric, no curvature. ROM normal. No CVA tenderness. Cardio: regular rate and rhythm, S1, S2 normal, no murmur, click, rub or gallop GI: soft, non-tender; bowel sounds normal; no masses,  no organomegaly Extremities: extremities normal, atraumatic, no cyanosis or edema  ECOG PERFORMANCE STATUS: 1 - Symptomatic but completely ambulatory  Blood pressure 123/79, pulse 72, temperature 97.7 F (36.5 C), temperature source Oral, resp. rate 16, height 5\' 5"  (1.651 m), weight 112 lb 11.2 oz (51.1 kg), SpO2 96%.  LABORATORY DATA: Lab Results  Component Value Date   WBC 5.2 08/09/2023   HGB 14.9 08/09/2023   HCT 46.3 08/09/2023   MCV 91.5 08/09/2023   PLT 241 08/09/2023      Chemistry      Component Value Date/Time   NA 142 08/09/2023 1043  K 3.5 08/09/2023 1043   CL 102 08/09/2023 1043   CO2 35 (H) 08/09/2023 1043   BUN 15 08/09/2023 1043   CREATININE 0.69 08/09/2023 1043   CREATININE 0.71 11/24/2019 1327      Component Value Date/Time   CALCIUM 9.5 08/09/2023 1043   ALKPHOS 61 08/09/2023 1043   AST 17 08/09/2023 1043   ALT 12 08/09/2023 1043   BILITOT 0.6 08/09/2023 1043       RADIOGRAPHIC STUDIES: CT Chest W Contrast  Result Date: 08/11/2023 CLINICAL DATA:  Non-small cell lung cancer. History of left lower lobe wedge resection and radiation therapy and separate radiation therapy of right middle lobe pulmonary nodule in 2020. Restaging. Dyspnea on exertion. * Tracking Code: BO * EXAM: CT CHEST WITH CONTRAST TECHNIQUE: Multidetector CT imaging of the  chest was performed during intravenous contrast administration. RADIATION DOSE REDUCTION: This exam was performed according to the departmental dose-optimization program which includes automated exposure control, adjustment of the mA and/or kV according to patient size and/or use of iterative reconstruction technique. CONTRAST:  75mL OMNIPAQUE IOHEXOL 300 MG/ML  SOLN COMPARISON:  02/04/2023 chest CT. FINDINGS: Cardiovascular: Normal heart size. No significant pericardial effusion/thickening. Left circumflex coronary atherosclerosis. Atherosclerotic nonaneurysmal thoracic aorta. Normal caliber pulmonary arteries. No central pulmonary emboli. Mediastinum/Nodes: No significant thyroid nodules. Chronic coarsely calcified 1.3 x 1.2 cm structure in the mid left thoracic esophagus on series 2/image 75), unchanged. Otherwise unremarkable esophagus. No pathologically enlarged axillary, mediastinal or hilar lymph nodes. Lungs/Pleura: No pneumothorax. No pleural effusion. Moderate centrilobular emphysema with mild diffuse bronchial wall thickening. Stable postsurgical changes from perihilar left lower lobe wedge resection. Partially cavitary irregular solid 2.5 x 1.9 cm pulmonary nodule in the posterior right middle lobe with adjacent fiducial marker (series 6/image 75), situated along the minor fissure and probably crossing into right upper lobe, previously 2.1 x 1.8 cm using similar measurement technique on 02/04/2023 chest CT, mildly increased. No acute consolidative airspace disease or new significant pulmonary nodules. Upper abdomen: Several subcentimeter hypodense superior liver lesions, too small to characterize, unchanged. Musculoskeletal: No aggressive appearing focal osseous lesions. Marked thoracic spondylosis. Superficial cystic 2.2 cm right axillary lesion on series 2/image 34) is stable and most compatible with a sebaceous cyst. IMPRESSION: 1. Partially cavitary irregular solid 2.5 cm pulmonary nodule in the  posterior right middle lobe with adjacent fiducial marker, mildly increased in size since 02/04/2023 chest CT, suspicious for recurrent malignancy. 2. No thoracic adenopathy or other findings of metastatic disease in the chest. 3. One vessel coronary atherosclerosis. 4. Aortic Atherosclerosis (ICD10-I70.0) and Emphysema (ICD10-J43.9). Electronically Signed   By: Delbert Phenix M.D.   On: 08/11/2023 12:06    ASSESSMENT AND PLAN: This is a very pleasant 67 years old white male with history of stage Ia (T1b, N0, M0) non-small cell lung cancer, adenocarcinoma with positive EGFR mutation in exon 21 ([id number]) diagnosed in December 2015 status post left lower lobe wedge resection under the care of Dr. Maren Beach. The patient also had recurrent disease presenting as stage Ia involving the right middle lobe status post SBRT in 2020 under the care of Dr. Kathrynn Running. The patient is currently on observation and he is feeling fine with no concerning complaints.  Repeat CT scan of the chest performed recently showed the partially cavitary irregular solid 2.5 cm pulmonary nodule in the posterior right middle lobe with adjacent fiducial marker mildly increased in size and still concerning for recurrent malignancy.    Lung Cancer History of left lower lobectomy and  right middle lobe radiation for lung cancer. Recent imaging shows slight increase in size of right middle lobe nodule.  Chest CT: Cavitary nodule in the right middle lobe slightly larger than previous scan (08/08/2023)No new symptoms reported. -Continue close monitoring with repeat imaging in 6 months. -Consider repeat radiation if nodule continues to grow.  COPD Patient on Breztri and Albuterol for management. No new respiratory symptoms reported. -Continue current inhaler regimen.  Nutrition Patient reports poor eating habits with only one meal per day, resulting in weight loss. -Encourage patient to eat 2-3 meals per day to maintain adequate nutrition.  Foot  Injury Patient reports discomfort and altered positioning of foot following a previous injury. -No specific plan discussed in conversation.  Follow-up -Return in 6 months for repeat imaging and evaluation. -Contact office if any new symptoms arise.   He was advised to call immediately if he has any other concerning symptoms in the interval. The patient voices understanding of current disease status and treatment options and is in agreement with the current care plan.  All questions were answered. The patient knows to call the clinic with any problems, questions or concerns. We can certainly see the patient much sooner if necessary.  The total time spent in the appointment was 20 minutes.  Disclaimer: This note was dictated with voice recognition software. Similar sounding words can inadvertently be transcribed and may not be corrected upon review.

## 2023-12-22 DIAGNOSIS — E559 Vitamin D deficiency, unspecified: Secondary | ICD-10-CM | POA: Diagnosis not present

## 2023-12-22 DIAGNOSIS — J449 Chronic obstructive pulmonary disease, unspecified: Secondary | ICD-10-CM | POA: Diagnosis not present

## 2023-12-22 DIAGNOSIS — I10 Essential (primary) hypertension: Secondary | ICD-10-CM | POA: Diagnosis not present

## 2023-12-22 DIAGNOSIS — Z0000 Encounter for general adult medical examination without abnormal findings: Secondary | ICD-10-CM | POA: Diagnosis not present

## 2023-12-22 DIAGNOSIS — E782 Mixed hyperlipidemia: Secondary | ICD-10-CM | POA: Diagnosis not present

## 2023-12-22 DIAGNOSIS — G8929 Other chronic pain: Secondary | ICD-10-CM | POA: Diagnosis not present

## 2023-12-22 DIAGNOSIS — C3432 Malignant neoplasm of lower lobe, left bronchus or lung: Secondary | ICD-10-CM | POA: Diagnosis not present

## 2023-12-22 DIAGNOSIS — Z1211 Encounter for screening for malignant neoplasm of colon: Secondary | ICD-10-CM | POA: Diagnosis not present

## 2023-12-22 DIAGNOSIS — M79671 Pain in right foot: Secondary | ICD-10-CM | POA: Diagnosis not present

## 2024-01-03 DIAGNOSIS — Z1211 Encounter for screening for malignant neoplasm of colon: Secondary | ICD-10-CM | POA: Diagnosis not present

## 2024-01-27 ENCOUNTER — Encounter: Payer: Self-pay | Admitting: Acute Care

## 2024-02-01 ENCOUNTER — Inpatient Hospital Stay: Payer: 59 | Attending: Internal Medicine

## 2024-02-01 ENCOUNTER — Encounter (HOSPITAL_COMMUNITY): Payer: Self-pay

## 2024-02-01 ENCOUNTER — Ambulatory Visit (HOSPITAL_COMMUNITY)
Admission: RE | Admit: 2024-02-01 | Discharge: 2024-02-01 | Disposition: A | Discharge status: Home / Self Care | Payer: 59 | Source: Ambulatory Visit | Attending: Internal Medicine | Admitting: Internal Medicine

## 2024-02-01 DIAGNOSIS — C3490 Malignant neoplasm of unspecified part of unspecified bronchus or lung: Secondary | ICD-10-CM | POA: Diagnosis not present

## 2024-02-01 DIAGNOSIS — C342 Malignant neoplasm of middle lobe, bronchus or lung: Secondary | ICD-10-CM | POA: Insufficient documentation

## 2024-02-01 DIAGNOSIS — I700 Atherosclerosis of aorta: Secondary | ICD-10-CM | POA: Diagnosis not present

## 2024-02-01 DIAGNOSIS — J432 Centrilobular emphysema: Secondary | ICD-10-CM | POA: Diagnosis not present

## 2024-02-01 LAB — CBC WITH DIFFERENTIAL (CANCER CENTER ONLY)
Abs Immature Granulocytes: 0.01 10*3/uL (ref 0.00–0.07)
Basophils Absolute: 0 10*3/uL (ref 0.0–0.1)
Basophils Relative: 0 %
Eosinophils Absolute: 0.1 10*3/uL (ref 0.0–0.5)
Eosinophils Relative: 3 %
HCT: 45.5 % (ref 39.0–52.0)
Hemoglobin: 14.6 g/dL (ref 13.0–17.0)
Immature Granulocytes: 0 %
Lymphocytes Relative: 33 %
Lymphs Abs: 1.6 10*3/uL (ref 0.7–4.0)
MCH: 29.7 pg (ref 26.0–34.0)
MCHC: 32.1 g/dL (ref 30.0–36.0)
MCV: 92.7 fL (ref 80.0–100.0)
Monocytes Absolute: 0.5 10*3/uL (ref 0.1–1.0)
Monocytes Relative: 10 %
Neutro Abs: 2.5 10*3/uL (ref 1.7–7.7)
Neutrophils Relative %: 54 %
Platelet Count: 236 10*3/uL (ref 150–400)
RBC: 4.91 MIL/uL (ref 4.22–5.81)
RDW: 13.1 % (ref 11.5–15.5)
WBC Count: 4.8 10*3/uL (ref 4.0–10.5)
nRBC: 0 % (ref 0.0–0.2)

## 2024-02-01 LAB — CMP (CANCER CENTER ONLY)
ALT: 14 U/L (ref 0–44)
AST: 19 U/L (ref 15–41)
Albumin: 4 g/dL (ref 3.5–5.0)
Alkaline Phosphatase: 64 U/L (ref 38–126)
Anion gap: 3 — ABNORMAL LOW (ref 5–15)
BUN: 15 mg/dL (ref 8–23)
CO2: 34 mmol/L — ABNORMAL HIGH (ref 22–32)
Calcium: 9.1 mg/dL (ref 8.9–10.3)
Chloride: 103 mmol/L (ref 98–111)
Creatinine: 0.68 mg/dL (ref 0.61–1.24)
GFR, Estimated: >60 mL/min (ref >60)
Glucose, Bld: 92 mg/dL (ref 70–99)
Potassium: 4.4 mmol/L (ref 3.5–5.1)
Sodium: 140 mmol/L (ref 135–145)
Total Bilirubin: 0.6 mg/dL (ref 0.0–1.2)
Total Protein: 6.4 g/dL — ABNORMAL LOW (ref 6.5–8.1)

## 2024-02-01 MED ORDER — IOHEXOL 300 MG/ML  SOLN
75.0000 mL | Freq: Once | INTRAMUSCULAR | Status: AC | PRN
  Administered 2024-02-01: 75 mL via INTRAVENOUS

## 2024-02-01 MED ORDER — SODIUM CHLORIDE (PF) 0.9 % IJ SOLN
INTRAMUSCULAR | Status: AC
  Filled 2024-02-01: qty 50

## 2024-02-04 DIAGNOSIS — M25571 Pain in right ankle and joints of right foot: Secondary | ICD-10-CM | POA: Diagnosis not present

## 2024-02-04 DIAGNOSIS — M79671 Pain in right foot: Secondary | ICD-10-CM | POA: Diagnosis not present

## 2024-02-08 ENCOUNTER — Inpatient Hospital Stay: Payer: 59 | Attending: Internal Medicine | Admitting: Internal Medicine

## 2024-02-08 VITALS — BP 133/74 | HR 63 | Temp 98.0°F | Resp 18 | Ht 65.0 in | Wt 115.3 lb

## 2024-02-08 DIAGNOSIS — Z923 Personal history of irradiation: Secondary | ICD-10-CM | POA: Insufficient documentation

## 2024-02-08 DIAGNOSIS — Z85118 Personal history of other malignant neoplasm of bronchus and lung: Secondary | ICD-10-CM | POA: Insufficient documentation

## 2024-02-08 DIAGNOSIS — C3490 Malignant neoplasm of unspecified part of unspecified bronchus or lung: Secondary | ICD-10-CM

## 2024-02-08 NOTE — Progress Notes (Signed)
 [facility] [phone]   [phone]  OFFICE PROGRESS NOTE  [name], MD [address] [place] [address]  DIAGNOSIS:   1) stage Ia (T1b, N0, M0) non-small cell lung cancer, adenocarcinoma with positive EGFR mutation in exon 21 ([id number]) diagnosed in December 2015. 2) Recurrent disease presenting as stage Ia involving the right middle lobe.  PRIOR THERAPY: 1)  status post left lower lobe wedge resection under the care of Dr. Maren Beach. 2)  status post SBRT in 2020 under the care of Dr. Kathrynn Running.   CURRENT THERAPY: Observation  INTERVAL HISTORY: Kyle Hamilton. 68 y.o. male returns to the clinic today for 30-month follow-up visit.Discussed the use of AI scribe software for clinical note transcription with the patient, who gave verbal consent to proceed.  History of Present Illness   Kyle Hamilton. is a 68 year old male with stage 1A non-small cell lung cancer who presents for evaluation with repeat CT scan of the chest for restage of his disease.  He has a history of stage 1A non-small cell lung cancer, adenocarcinoma with a positive EGFR mutation, specifically exon 21 L861Q, diagnosed in December 2015. He initially underwent a left lower lobe wedge resection in 2015. In 2020, he received stereotactic body radiation therapy (SBRT) to address a recurrent nodule in the right middle lobe. Since then, he has been under observation. A recent CT scan of the chest performed last week showed stability in his condition with no changes in the density on his right lung and no evidence of growth or spread.  He feels 'pretty good' since his last visit six months ago and has no new complaints. He was taken off high blood pressure medication as his blood pressure is now under control. No nausea, vomiting, diarrhea, headaches, or changes in vision. He is actively trying to gain weight and notes that his weight has remained stable since the last  visit.  He mentions a separate issue with his right foot, for which he will have a brace fitted by orthopedic surgery. He recalls cracking his heel bone about two years ago, which has since caused his right foot to 'set sideways'.       MEDICAL HISTORY: Past Medical History:  Diagnosis Date   Chronic mastoiditis, bilateral    COPD (chronic obstructive pulmonary disease) (HCC)    Hearing loss    Hypertension    Lung cancer (HCC)    Pneumothorax, left    History of post op   Prostate cancer (HCC)    Shortness of breath dyspnea    on exertion   Spinal stenosis     ALLERGIES:  has no known allergies.  MEDICATIONS:  Current Outpatient Medications  Medication Sig Dispense Refill   albuterol (VENTOLIN HFA) 108 (90 Base) MCG/ACT inhaler Inhale 2 puffs into the lungs every 6 (six) hours as needed for wheezing or shortness of breath. 8 g 3   Budeson-Glycopyrrol-Formoterol (BREZTRI AEROSPHERE) 160-9-4.8 MCG/ACT AERO INHALE 2 PUFFS INTO THE LUNGS IN THE MORNING AND AT BEDTIME 10.7 g 11   budesonide-formoterol (SYMBICORT) 160-4.5 MCG/ACT inhaler Inhale 2 puffs into the lungs every evening. 1 each 6   hydrochlorothiazide (MICROZIDE) 12.5 MG capsule Take 12.5 mg by mouth daily.  1   rosuvastatin (CRESTOR) 10 MG tablet Take 10 mg by mouth daily.     Vitamin D, Ergocalciferol, (DRISDOL) 1.25 MG (50000 UNIT) CAPS capsule Take 50,000 Units by mouth once a week.  No current facility-administered medications for this visit.    SURGICAL HISTORY:  Past Surgical History:  Procedure Laterality Date   BAHA REVISION Right 08/30/2014   Procedure: BONE ANCHORED HEARING AID (BAHA) REVISION RIGHT SIDE;  Surgeon: Serena Colonel, MD;  Location: [facility];  Service: ENT;  Laterality: Right;   BONE ANCHORED HEARING AID IMPLANT  08/03/2012   Procedure: BONE ANCHORED HEARING AID (BAHA) IMPLANT;  Surgeon: Serena Colonel, MD;  Location: [facility] OR;  Service: ENT;  Laterality: Right;   BRONCHIAL BIOPSY  07/21/2022    Procedure: BRONCHIAL BIOPSIES;  Surgeon: Josephine Igo, DO;  Location: [facility];  Service: Pulmonary;;   BRONCHIAL BIOPSY  11/10/2022   Procedure: BRONCHIAL BIOPSIES;  Surgeon: Josephine Igo, DO;  Location: [facility];  Service: Pulmonary;;   BRONCHIAL BRUSHINGS  07/21/2022   Procedure: BRONCHIAL BRUSHINGS;  Surgeon: Josephine Igo, DO;  [facility];  Service: Pulmonary;;   BRONCHIAL BRUSHINGS  11/10/2022   Procedure: BRONCHIAL BRUSHINGS;  Surgeon: Josephine Igo, DO;  [facility];  Service: Pulmonary;;   BRONCHIAL NEEDLE ASPIRATION BIOPSY  07/21/2022   Procedure: BRONCHIAL NEEDLE ASPIRATION BIOPSIES;  Surgeon: Josephine Igo, DO;  [facility];  Service: Pulmonary;;   BRONCHIAL NEEDLE ASPIRATION BIOPSY  11/10/2022   Procedure: BRONCHIAL NEEDLE ASPIRATION BIOPSIES;  Surgeon: Josephine Igo, DO;  Location: [facility];  Service: Pulmonary;;   COLONOSCOPY  2007   CYSTOSCOPY  03/25/2018   Procedure: CYSTOSCOPY;  Surgeon: Ihor Gully, MD;  Location: [facility];  Service: Urology;;  no seeds noted in bladder   FIDUCIAL MARKER PLACEMENT  07/21/2022   Procedure: FIDUCIAL MARKER PLACEMENT;  Surgeon: Josephine Igo, DO;  Location: [facility];  Service: Pulmonary;;   LEG SURGERY     right leg deep cut   MOUTH SURGERY     PROSTATE BIOPSY     RADIOACTIVE SEED IMPLANT N/A 03/25/2018   Procedure: RADIOACTIVE SEED IMPLANT/BRACHYTHERAPY IMPLANT;  Surgeon: Ihor Gully, MD;  Location: [facility];  Service: Urology;  Laterality: N/A;  71 seeds implanted   SPACE OAR INSTILLATION N/A 03/25/2018   Procedure: SPACE OAR INSTILLATION;  Surgeon: Ihor Gully, MD;  Location: [facility];  Service: Urology;  Laterality: N/A;   STAPEDES SURGERY     bilateral   VIDEO ASSISTED THORACOSCOPY (VATS)/ LOBECTOMY Left 10/09/2014   Procedure: VIDEO ASSISTED THORACOSCOPY (VATS)/ LOBECTOMY;  Surgeon: Kerin Perna, MD;  Location: [facility]  OR;  Service: Thoracic;  Laterality: Left;    REVIEW OF SYSTEMS:  A comprehensive review of systems was negative except for: Musculoskeletal: positive for arthralgias   PHYSICAL EXAMINATION: General appearance: alert, cooperative, and no distress Head: Normocephalic, without obvious abnormality, atraumatic Neck: no adenopathy, no JVD, supple, symmetrical, trachea midline, and thyroid not enlarged, symmetric, no tenderness/mass/nodules Lymph nodes: Cervical, supraclavicular, and axillary nodes normal. Resp: clear to auscultation bilaterally Back: symmetric, no curvature. ROM normal. No CVA tenderness. Cardio: regular rate and rhythm, S1, S2 normal, no murmur, click, rub or gallop GI: soft, non-tender; bowel sounds normal; no masses,  no organomegaly Extremities: extremities normal, atraumatic, no cyanosis or edema  ECOG PERFORMANCE STATUS: 1 - Symptomatic but completely ambulatory  Blood pressure 133/74, pulse 63, temperature 98 F (36.7 C), temperature source Temporal, resp. rate 18, height 5\' 5"  (1.651 m), weight 115 lb 4.8 oz (52.3 kg), SpO2 95%.  LABORATORY DATA: Lab Results  Component Value Date   WBC 4.8 02/01/2024   HGB 14.6 02/01/2024  HCT 45.5 02/01/2024   MCV 92.7 02/01/2024   PLT 236 02/01/2024      Chemistry      Component Value Date/Time   NA 140 02/01/2024 1111   K 4.4 02/01/2024 1111   CL 103 02/01/2024 1111   CO2 34 (H) 02/01/2024 1111   BUN 15 02/01/2024 1111   CREATININE 0.68 02/01/2024 1111   CREATININE 0.71 11/24/2019 1327      Component Value Date/Time   CALCIUM 9.1 02/01/2024 1111   ALKPHOS 64 02/01/2024 1111   AST 19 02/01/2024 1111   ALT 14 02/01/2024 1111   BILITOT 0.6 02/01/2024 1111       RADIOGRAPHIC STUDIES: CT Chest W Contrast Result Date: 02/08/2024 CLINICAL DATA:  Non-small-cell lung cancer. Restaging. * Tracking Code: BO * EXAM: CT CHEST WITH CONTRAST TECHNIQUE: Multidetector CT imaging of the chest was performed during intravenous  contrast administration. RADIATION DOSE REDUCTION: This exam was performed according to the departmental dose-optimization program which includes automated exposure control, adjustment of the mA and/or kV according to patient size and/or use of iterative reconstruction technique. CONTRAST:  75mL OMNIPAQUE IOHEXOL 300 MG/ML  SOLN COMPARISON:  08/09/2023 FINDINGS: Cardiovascular: The heart size is normal. No substantial pericardial effusion. Coronary artery calcification is evident. Mild atherosclerotic calcification is noted in the wall of the thoracic aorta. Mediastinum/Nodes: No mediastinal lymphadenopathy. There is no hilar lymphadenopathy. Dense calcification seen in the midthoracic esophagus is stable, indeterminate but most likely likely benign. There is no axillary lymphadenopathy. Lungs/Pleura: Centrilobular and paraseptal emphysema evident. Postsurgical scarring in the parahilar left lower lobe is stable in the interval. Cavitary lesion in the right mid lung measured previously at 2.5 x 1.9 cm is now 2.5 x 1.8 cm. This is probably in the posterior right middle lobe but is positioned near the fissural confluence. No new suspicious pulmonary nodule or mass. No focal airspace consolidation. No pleural effusion. Upper Abdomen: Scattered tiny hypodensities in the liver parenchyma are too small to characterize but are statistically most likely benign. No followup imaging is recommended. Musculoskeletal: No worrisome lytic or sclerotic osseous abnormality. The 2.2 cm cystic lesion in the anterior right low axilla seen on the previous study persists but there appears to be some is gas in the lesion on the current study with an apparent defect in the anterior wall. IMPRESSION: 1. Stable exam. The partially cavitary right lung nodule identified previously is similar in size in the interval. 2. Post treatment scarring in the left lung is unchanged. 3. Cystic lesion low right axillary region is similar in size but  contains gas on the current study. This could be a sebaceous cyst. Gas may be related to drainage procedure or trauma. This should be readily amenable to clinical inspection. 4.  Emphysema (ICD10-J43.9) and Aortic Atherosclerosis (ICD10-170.0) Electronically Signed   By: Kennith Center M.D.   On: 02/08/2024 11:54    ASSESSMENT AND PLAN: This is a very pleasant 68 years old white male with history of stage Ia (T1b, N0, M0) non-small cell lung cancer, adenocarcinoma with positive EGFR mutation in exon 21 ([id number]) diagnosed in December 2015 status post left lower lobe wedge resection under the care of Dr. Maren Beach. The patient also had recurrent disease presenting as stage Ia involving the right middle lobe status post SBRT in 2020 under the care of Dr. Kathrynn Running. The patient is currently on observation and he is feeling fine with no concerning complaints. He had repeat CT scan of the chest performed recently.  I personally  independently reviewed the scan images and discussed the result with the patient today.  His scan showed no concerning findings for disease progression.    Stage 1A non-small cell lung cancer, adenocarcinoma with EGFR mutation Rozann Lesches has stage 1A non-small cell lung cancer, adenocarcinoma with a positive EGFR mutation (exon 21, L861Q), initially diagnosed in December 2015. He underwent a left lower lobe wedge resection in 2015 and stereotactic body radiation therapy (SBRT) to the right middle lobe nodule in 2020. A recent CT scan shows no changes in the density on the right lung, indicating no growth or spread of the cancer. Continued observation is warranted due to the stability of the disease and absence of new symptoms or changes in the CT scan. - Continue observation with repeat CT scan of the chest every six months to monitor the density in the right lung.   He was advised to call immediately if he has any other concerning symptoms in the interval. The patient voices understanding  of current disease status and treatment options and is in agreement with the current care plan.  All questions were answered. The patient knows to call the clinic with any problems, questions or concerns. We can certainly see the patient much sooner if necessary.  The total time spent in the appointment was 20 minutes.  Disclaimer: This note was dictated with voice recognition software. Similar sounding words can inadvertently be transcribed and may not be corrected upon review.

## 2024-03-21 DIAGNOSIS — M2031 Hallux varus (acquired), right foot: Secondary | ICD-10-CM | POA: Diagnosis not present

## 2024-05-15 ENCOUNTER — Other Ambulatory Visit: Payer: Self-pay | Admitting: Acute Care

## 2024-05-15 DIAGNOSIS — J449 Chronic obstructive pulmonary disease, unspecified: Secondary | ICD-10-CM

## 2024-05-15 NOTE — Telephone Encounter (Unsigned)
 Copied from CRM [id number]. Topic: Clinical - Medication Refill >> May 15, 2024 12:25 PM Rozanna G wrote: Medication: albuterol  (VENTOLIN  HFA) 108 (90 Base) MCG/ACT inhaler and budesonide -formoterol  (SYMBICORT ) 160-4.5 MCG/ACT inhaler  Has the patient contacted their pharmacy? Yes (Agent: If no, request that the patient contact the pharmacy for the refill. If patient does not wish to contact the pharmacy document the reason why and proceed with request.) (Agent: If yes, when and what did the pharmacy advise?)  This is the patient's preferred pharmacy:  [id number] [place], [place] - [address] AT [facility] & [address] [place] [address] Phone: [phone] Fax: [phone]  Is this the correct pharmacy for this prescription? Yes If no, delete pharmacy and type the correct one.   Has the prescription been filled recently? Yes  Is the patient out of the medication? Yes  Has the patient been seen for an appointment in the last year OR does the patient have an upcoming appointment? No  Can we respond through MyChart? No  Agent: Please be advised that Rx refills may take up to 3 business days. We ask that you follow-up with your pharmacy.

## 2024-05-22 MED ORDER — ALBUTEROL SULFATE HFA 108 (90 BASE) MCG/ACT IN AERS: 2.0000 | INHALATION_SPRAY | Freq: Four times a day (QID) | RESPIRATORY_TRACT | 0 refills | Status: DC | PRN

## 2024-05-22 NOTE — Telephone Encounter (Signed)
 Copied from CRM [id number]. Topic: Clinical - Medication Refill >> May 15, 2024 12:25 PM Rozanna G wrote: Medication: albuterol  (VENTOLIN  HFA) 108 (90 Base) MCG/ACT inhaler and budesonide -formoterol  (SYMBICORT ) 160-4.5 MCG/ACT inhaler  Has the patient contacted their pharmacy? Yes (Agent: If no, request that the patient contact the pharmacy for the refill. If patient does not wish to contact the pharmacy document the reason why and proceed with request.) (Agent: If yes, when and what did the pharmacy advise?)  This is the patient's preferred pharmacy:  [id number] [place], [place] - [address] AT [facility] OF [facility] & [address] [place] [address] [place] [address] Phone: [phone] Fax: [phone]  Is this the correct pharmacy for this prescription? Yes If no, delete pharmacy and type the correct one.   Has the prescription been filled recently? Yes  Is the patient out of the medication? Yes  Has the patient been seen for an appointment in the last year OR does the patient have an upcoming appointment? No  Can we respond through MyChart? No  Agent: Please be advised that Rx refills may take up to 3 business days. We ask that you follow-up with your pharmacy. >> May 22, 2024  2:13 PM Joesph PARAS wrote: Patient states he is entirely out of his inhalers and does not understand why it has not been filled. Patient is requesting this be filled, as he needs it. Attempted to explain to patient why inhalers may not have been filled, patient does not voice understanding.   Called and spoke with the patient. I advised pt I can send in courtesy refill and told pt to keep upcoming appt 7/29 with SG for further refills. Pt verbalized understanding. Nfn

## 2024-06-06 ENCOUNTER — Encounter: Payer: Self-pay | Admitting: Acute Care

## 2024-06-06 ENCOUNTER — Ambulatory Visit (INDEPENDENT_AMBULATORY_CARE_PROVIDER_SITE_OTHER): Admitting: Acute Care

## 2024-06-06 VITALS — BP 109/76 | HR 75 | Temp 98.1°F | Ht 65.0 in | Wt 112.4 lb

## 2024-06-06 DIAGNOSIS — Z87891 Personal history of nicotine dependence: Secondary | ICD-10-CM

## 2024-06-06 DIAGNOSIS — J449 Chronic obstructive pulmonary disease, unspecified: Secondary | ICD-10-CM

## 2024-06-06 DIAGNOSIS — Z85118 Personal history of other malignant neoplasm of bronchus and lung: Secondary | ICD-10-CM

## 2024-06-06 MED ORDER — BREZTRI AEROSPHERE 160-9-4.8 MCG/ACT IN AERO: 2.0000 | INHALATION_SPRAY | Freq: Two times a day (BID) | RESPIRATORY_TRACT | Status: AC

## 2024-06-06 MED ORDER — BREZTRI AEROSPHERE 160-9-4.8 MCG/ACT IN AERO: 2.0000 | INHALATION_SPRAY | Freq: Two times a day (BID) | RESPIRATORY_TRACT | 11 refills | Status: AC

## 2024-06-06 MED ORDER — ALBUTEROL SULFATE HFA 108 (90 BASE) MCG/ACT IN AERS: 2.0000 | INHALATION_SPRAY | Freq: Four times a day (QID) | RESPIRATORY_TRACT | 6 refills | Status: DC | PRN

## 2024-06-06 NOTE — Patient Instructions (Addendum)
 It is good to see you today. I have refilled your Breztri  and your albuterol  . Use Breztri  2 puffs in the morning and 2 puffs in the evening. Rinse mouth after use. Use albuterol  as needed for breakthrough shortness of breath or wheezing.  Follow up with Dr. Sherrod for continued chest surveillance to monitor for cancer.  Call if you need us  for any worsening shortness of breath of change in your breathing. Call to be seen for any unexplained weight loss or blood in sputum. Follow up in 6 months with Lauraine NP. We will give you some samples of Breztri  today . Please contact office for sooner follow up if symptoms do not improve or worsen or seek emergency care

## 2024-06-06 NOTE — Progress Notes (Signed)
 History of Present Illness Kyle Hamilton. is a 68 y.o. male former smoker with history of COPD, emphysema, stage 1A non small cell  lung cancer( adenocarcinoma) in 2015, then recurrence in 2020. He is followed by Dr. Shelah.  Synopsis 68 years old white male former smoker with history of stage Ia (T1b, N0, M0) non-small cell lung cancer, adenocarcinoma with positive EGFR mutation in exon 21 (L861Q) diagnosed in December 2015 status post left lower lobe wedge resection under the care of Dr. Obadiah. The patient also had recurrent disease presenting as stage Ia involving the right middle lobe status post SBRT in 2020 under the care of Dr. Patrcia. The patient is currently being monitored with surveillance CT's by Dr. Sherrod.     06/06/2024 Pt. Presents for follow up for refill of his Breztri . He has not been seen in the office since 11/2022. He states he has been doing well from a pulmonary perspective. The heat really affects his breathing. He is out of his Breztri  and Albuterol . Both prescriptions have been refilled. He states he is using his medications as prescribed. He is using the albuterol  more in the heat. He does not use it as much in winter. No hemoptysis or unexplained weight loss.  Dr. Sherrod is following for CT Surveillance of the chest.Last Ct chest was done 01/1014, which was stable . Plan is for continued surveillance in 6 months.  Test Results: CT Chest 01/2024 Stable exam. The partially cavitary right lung nodule identified previously is similar in size in the interval. 2. Post treatment scarring in the left lung is unchanged. 3. Cystic lesion low right axillary region is similar in size but contains gas on the current study. This could be a sebaceous cyst. Gas may be related to drainage procedure or trauma. This should be readily amenable to clinical inspection. 4.  Emphysema (ICD10-J43.9) and Aortic Atherosclerosis (ICD10-170.0)    PFT 06/2014              Latest Ref Rng & Units 02/01/2024   11:11 AM 08/09/2023   10:43 AM 02/04/2023   10:55 AM  CBC  WBC 4.0 - 10.5 K/uL 4.8  5.2  4.9   Hemoglobin 13.0 - 17.0 g/dL 85.3  85.0  85.4   Hematocrit 39.0 - 52.0 % 45.5  46.3  44.9   Platelets 150 - 400 K/uL 236  241  227        Latest Ref Rng & Units 02/01/2024   11:11 AM 08/09/2023   10:43 AM 02/04/2023   10:55 AM  BMP  Glucose 70 - 99 mg/dL 92  86  876   BUN 8 - 23 mg/dL 15  15  17    Creatinine 0.61 - 1.24 mg/dL 9.31  9.30  9.25   Sodium 135 - 145 mmol/L 140  142  142   Potassium 3.5 - 5.1 mmol/L 4.4  3.5  3.6   Chloride 98 - 111 mmol/L 103  102  101   CO2 22 - 32 mmol/L 34  35  35   Calcium 8.9 - 10.3 mg/dL 9.1  9.5  9.3     BNP No results found for: BNP  ProBNP No results found for: PROBNP  PFT    Component Value Date/Time   FEV1PRE 1.25 09/26/2014 0841   FEV1POST 1.67 09/26/2014 0841   FVCPRE 3.06 09/26/2014 0841   FVCPOST 3.56 09/26/2014 0841   TLC 7.51 09/26/2014 0841   DLCOUNC 20.25 09/26/2014 0841   PREFEV1FVCRT 41  09/26/2014 0841   PSTFEV1FVCRT 47 09/26/2014 0841    No results found.   Past medical hx Past Medical History:  Diagnosis Date   Chronic mastoiditis, bilateral    COPD (chronic obstructive pulmonary disease) (HCC)    Hearing loss    Hypertension    Lung cancer (HCC)    Pneumothorax, left    History of post op   Prostate cancer (HCC)    Shortness of breath dyspnea    on exertion   Spinal stenosis      Social History   Tobacco Use   Smoking status: Former    Current packs/day: 0.00    Average packs/day: 1 pack/day for 35.0 years (35.0 ttl pk-yrs)    Types: Cigarettes    Start date: 05/12/1979    Quit date: 05/11/2014    Years since quitting: 10.0   Smokeless tobacco: Never  Vaping Use   Vaping status: Never Used  Substance Use Topics   Alcohol use: No   Drug use: No    [name] reports that he quit smoking about 10 years ago. His smoking use included cigarettes. He started smoking  about 45 years ago. He has a 35 pack-year smoking history. He has never used smokeless tobacco. He reports that he does not drink alcohol and does not use drugs.  Tobacco Cessation: Counseling given: Not Answered Former smoker   Past surgical hx, Family hx, Social hx all reviewed.  Current Outpatient Medications on File Prior to Visit  Medication Sig   albuterol  (VENTOLIN  HFA) 108 (90 Base) MCG/ACT inhaler Inhale 2 puffs into the lungs every 6 (six) hours as needed for wheezing or shortness of breath.   Budeson-Glycopyrrol-Formoterol  (BREZTRI  AEROSPHERE) 160-9-4.8 MCG/ACT AERO INHALE 2 PUFFS INTO THE LUNGS IN THE MORNING AND AT BEDTIME   rosuvastatin (CRESTOR) 10 MG tablet Take 10 mg by mouth daily.   Vitamin D, Ergocalciferol, (DRISDOL) 1.25 MG (50000 UNIT) CAPS capsule Take 50,000 Units by mouth once a week.   budesonide -formoterol  (SYMBICORT ) 160-4.5 MCG/ACT inhaler Inhale 2 puffs into the lungs every evening. (Patient not taking: Reported on 06/06/2024)   hydrochlorothiazide  (MICROZIDE ) 12.5 MG capsule Take 12.5 mg by mouth daily. (Patient not taking: Reported on 06/06/2024)   No current facility-administered medications on file prior to visit.     No Known Allergies  Review Of Systems:  Constitutional:   No  weight loss, night sweats,  Fevers, chills, fatigue, or  lassitude.  HEENT:   No headaches,  Difficulty swallowing,  Tooth/dental problems, or  Sore throat,                No sneezing, itching, ear ache, nasal congestion, post nasal drip,   CV:  No chest pain,  Orthopnea, PND, swelling in lower extremities, anasarca, dizziness, palpitations, syncope.   GI  No heartburn, indigestion, abdominal pain, nausea, vomiting, diarrhea, change in bowel habits, loss of appetite, bloody stools.   Resp: No shortness of breath with exertion or at rest.  No excess mucus, no productive cough,  No non-productive cough,  No coughing up of blood.  No change in color of mucus.  No wheezing.  No  chest wall deformity  Skin: no rash or lesions.  GU: no dysuria, change in color of urine, no urgency or frequency.  No flank pain, no hematuria   MS:  No joint pain or swelling.  No decreased range of motion.  No back pain.  Psych:  No change in mood or affect. No depression or anxiety.  No memory loss.   Vital Signs BP 109/76 (BP Location: Left Arm, Patient Position: Sitting, Cuff Size: Normal)   Pulse 75   Temp 98.1 F (36.7 C) (Oral)   Ht 5' 5 (1.651 m)   Wt 112 lb 6.4 oz (51 kg)   SpO2 92%   BMI 18.70 kg/m    Physical Exam:  General- No distress,  A&Ox3, pleasant ENT: No sinus tenderness, TM clear, pale nasal mucosa, no oral exudate,no post nasal drip, no LAN Cardiac: S1, S2, regular rate and rhythm, no murmur Chest: No wheeze/ rales/ dullness; no accessory muscle use, no nasal flaring, no sternal retractions Abd.: Soft Non-tender, ND, BS +, Body mass index is 18.7 kg/m.  Ext: No clubbing cyanosis, edema, no obvious deformities Neuro:  normal strength, MAE x 4, A&O x 3, appropriate Skin: No rashes, warm and dry, no obvious skin lesions  Psych: normal mood and behavior   Assessment/Plan COPD Former smoker  History of lung cancer with recurrence>> surveillance by Dr. Sherrod Plan I have refilled your Breztri  and your albuterol  . Use Breztri  2 puffs in the morning and 2 puffs in the evening. Rinse mouth after use. Use albuterol  as needed for breakthrough shortness of breath or wheezing.  Follow up with Dr. Sherrod for continued chest surveillance to monitor for cancer.  Call if you need us  for any worsening shortness of breath of change in your breathing. Call to be seen for any unexplained weight loss or blood in sputum. Follow up in 6 months with Lauraine NP. We will give you some samples of Breztri  today . Please contact office for sooner follow up if symptoms do not improve or worsen or seek emergency care    I spent 25 minutes dedicated to the care of this  patient on the date of this encounter to include pre-visit review of records, face-to-face time with the patient discussing conditions above, post visit ordering of testing, clinical documentation with the electronic health record, making appropriate referrals as documented, and communicating necessary information to the patient's healthcare team.   Lauraine JULIANNA Lites, NP 06/06/2024  3:06 PM

## 2024-07-31 ENCOUNTER — Ambulatory Visit (HOSPITAL_COMMUNITY)
Admission: RE | Admit: 2024-07-31 | Discharge: 2024-07-31 | Disposition: A | Discharge status: Home / Self Care | Source: Ambulatory Visit | Attending: Internal Medicine | Admitting: Internal Medicine

## 2024-07-31 ENCOUNTER — Inpatient Hospital Stay: Attending: Internal Medicine

## 2024-07-31 DIAGNOSIS — Z923 Personal history of irradiation: Secondary | ICD-10-CM | POA: Diagnosis not present

## 2024-07-31 DIAGNOSIS — Z85118 Personal history of other malignant neoplasm of bronchus and lung: Secondary | ICD-10-CM | POA: Insufficient documentation

## 2024-07-31 DIAGNOSIS — I700 Atherosclerosis of aorta: Secondary | ICD-10-CM | POA: Diagnosis not present

## 2024-07-31 DIAGNOSIS — C3490 Malignant neoplasm of unspecified part of unspecified bronchus or lung: Secondary | ICD-10-CM | POA: Diagnosis not present

## 2024-07-31 DIAGNOSIS — J432 Centrilobular emphysema: Secondary | ICD-10-CM | POA: Diagnosis not present

## 2024-07-31 DIAGNOSIS — Z902 Acquired absence of lung [part of]: Secondary | ICD-10-CM | POA: Insufficient documentation

## 2024-07-31 LAB — CMP (CANCER CENTER ONLY)
ALT: 18 U/L (ref 0–44)
AST: 23 U/L (ref 15–41)
Albumin: 4.3 g/dL (ref 3.5–5.0)
Alkaline Phosphatase: 68 U/L (ref 38–126)
Anion gap: 4 — ABNORMAL LOW (ref 5–15)
BUN: 10 mg/dL (ref 8–23)
CO2: 35 mmol/L — ABNORMAL HIGH (ref 22–32)
Calcium: 9.4 mg/dL (ref 8.9–10.3)
Chloride: 101 mmol/L (ref 98–111)
Creatinine: 0.63 mg/dL (ref 0.61–1.24)
GFR, Estimated: >60 mL/min (ref >60)
Glucose, Bld: 90 mg/dL (ref 70–99)
Potassium: 4.4 mmol/L (ref 3.5–5.1)
Sodium: 140 mmol/L (ref 135–145)
Total Bilirubin: 0.7 mg/dL (ref 0.0–1.2)
Total Protein: 6.9 g/dL (ref 6.5–8.1)

## 2024-07-31 LAB — CBC WITH DIFFERENTIAL (CANCER CENTER ONLY)
Abs Immature Granulocytes: 0.01 K/uL (ref 0.00–0.07)
Basophils Absolute: 0 K/uL (ref 0.0–0.1)
Basophils Relative: 0 %
Eosinophils Absolute: 0.1 K/uL (ref 0.0–0.5)
Eosinophils Relative: 3 %
HCT: 48 % (ref 39.0–52.0)
Hemoglobin: 15.3 g/dL (ref 13.0–17.0)
Immature Granulocytes: 0 %
Lymphocytes Relative: 35 %
Lymphs Abs: 1.8 K/uL (ref 0.7–4.0)
MCH: 29.5 pg (ref 26.0–34.0)
MCHC: 31.9 g/dL (ref 30.0–36.0)
MCV: 92.7 fL (ref 80.0–100.0)
Monocytes Absolute: 0.5 K/uL (ref 0.1–1.0)
Monocytes Relative: 9 %
Neutro Abs: 2.6 K/uL (ref 1.7–7.7)
Neutrophils Relative %: 53 %
Platelet Count: 243 K/uL (ref 150–400)
RBC: 5.18 MIL/uL (ref 4.22–5.81)
RDW: 13.2 % (ref 11.5–15.5)
WBC Count: 5 K/uL (ref 4.0–10.5)
nRBC: 0 % (ref 0.0–0.2)

## 2024-07-31 MED ORDER — SODIUM CHLORIDE (PF) 0.9 % IJ SOLN
INTRAMUSCULAR | Status: AC
  Filled 2024-07-31: qty 50

## 2024-07-31 MED ORDER — IOHEXOL 300 MG/ML  SOLN
75.0000 mL | Freq: Once | INTRAMUSCULAR | Status: AC | PRN
  Administered 2024-07-31: 75 mL via INTRAVENOUS

## 2024-08-10 ENCOUNTER — Inpatient Hospital Stay: Attending: Internal Medicine | Admitting: Internal Medicine

## 2024-08-10 VITALS — BP 126/84 | HR 65 | Temp 97.8°F | Resp 17 | Ht 65.0 in | Wt 114.2 lb

## 2024-08-10 DIAGNOSIS — C3490 Malignant neoplasm of unspecified part of unspecified bronchus or lung: Secondary | ICD-10-CM

## 2024-08-10 DIAGNOSIS — Z79899 Other long term (current) drug therapy: Secondary | ICD-10-CM | POA: Diagnosis not present

## 2024-08-10 DIAGNOSIS — C342 Malignant neoplasm of middle lobe, bronchus or lung: Secondary | ICD-10-CM | POA: Insufficient documentation

## 2024-08-10 NOTE — Progress Notes (Signed)
 [facility] Telephone:([phone]   [phone]  OFFICE PROGRESS NOTE  Kyle Harvey, MD [address] [place] [address]  DIAGNOSIS:   1) stage IA (T1b, N0, M0) non-small cell lung cancer, adenocarcinoma with positive EGFR mutation in exon 21 (L861Q) diagnosed in December 2015. 2) Recurrent disease presenting as stage Ia involving the right middle lobe.  PRIOR THERAPY: 1)  status post left lower lobe wedge resection under the care of Dr. Obadiah. 2)  status post SBRT in 2020 under the care of Dr. Patrcia.   CURRENT THERAPY: Observation  INTERVAL HISTORY: Kyle Hamilton. 68 y.o. male returns to the clinic today for 81-month follow-up visit.Discussed the use of AI scribe software for clinical note transcription with the patient, who gave verbal consent to proceed.  History of Present Illness Kyle Hamilton. is a 68 year old male with stage 1A nonsmall cell lung cancer who presents for evaluation and repeat CT scan for restaging of his disease.  He was diagnosed with stage 1A nonsmall cell lung cancer, adenocarcinoma with a positive EGFR mutation in exon 21 (L861Q) in December 2016 and underwent a left lower lobe wedge resection at that time.  In 2020, he received stereotactic body radiation therapy (SBRT) for a recurrent lung nodule in the right middle lobe and has been under observation since then.  No new symptoms such as chest pain, hemoptysis, or significant weight loss are present, although he mentions a slight weight gain recently. He used his inhaler after walking a considerable distance today but otherwise feels 'pretty good' with no other breathing issues. He practices breathing exercises regularly.     MEDICAL HISTORY: Past Medical History:  Diagnosis Date   Chronic mastoiditis, bilateral    COPD (chronic obstructive pulmonary disease) (HCC)    Hearing loss    Hypertension    Lung cancer (HCC)    Pneumothorax, left    History  of post op   Prostate cancer (HCC)    Shortness of breath dyspnea    on exertion   Spinal stenosis     ALLERGIES:  has no known allergies.  MEDICATIONS:  Current Outpatient Medications  Medication Sig Dispense Refill   albuterol  (VENTOLIN  HFA) 108 (90 Base) MCG/ACT inhaler Inhale 2 puffs into the lungs every 6 (six) hours as needed for wheezing or shortness of breath. 8 g 6   budesonide -glycopyrrolate -formoterol  (BREZTRI  AEROSPHERE) 160-9-4.8 MCG/ACT AERO inhaler Inhale 2 puffs into the lungs in the morning and at bedtime. 10.7 g 11   budesonide -glycopyrrolate -formoterol  (BREZTRI  AEROSPHERE) 160-9-4.8 MCG/ACT AERO inhaler Inhale 2 puffs into the lungs in the morning and at bedtime.     hydrochlorothiazide  (MICROZIDE ) 12.5 MG capsule Take 12.5 mg by mouth daily.  1   rosuvastatin (CRESTOR) 10 MG tablet Take 10 mg by mouth daily.     Vitamin D, Ergocalciferol, (DRISDOL) 1.25 MG (50000 UNIT) CAPS capsule Take 50,000 Units by mouth once a week.     No current facility-administered medications for this visit.    SURGICAL HISTORY:  Past Surgical History:  Procedure Laterality Date   BAHA REVISION Right 08/30/2014   Procedure: BONE ANCHORED HEARING AID (BAHA) REVISION RIGHT SIDE;  Surgeon: Ida Loader, MD;  Location: [facility];  Service: ENT;  Laterality: Right;   BONE ANCHORED HEARING AID IMPLANT  08/03/2012   Procedure: BONE ANCHORED HEARING AID (BAHA) IMPLANT;  Surgeon: Ida Loader, MD;  Location: [facility];  Service: ENT;  Laterality: Right;  BRONCHIAL BIOPSY  07/21/2022   Procedure: BRONCHIAL BIOPSIES;  Surgeon: Brenna Adine CROME, DO;  Location: [facility];  Service: Pulmonary;;   BRONCHIAL BIOPSY  11/10/2022   Procedure: BRONCHIAL BIOPSIES;  Surgeon: Brenna Adine CROME, DO;  Location: [facility];  Service: Pulmonary;;   BRONCHIAL BRUSHINGS  07/21/2022   Procedure: BRONCHIAL BRUSHINGS;  Surgeon: Brenna Adine CROME, DO;  Location: [facility];  Service: Pulmonary;;   BRONCHIAL BRUSHINGS  11/10/2022    Procedure: BRONCHIAL BRUSHINGS;  Surgeon: Brenna Adine CROME, DO;  Location: [facility];  Service: Pulmonary;;   BRONCHIAL NEEDLE ASPIRATION BIOPSY  07/21/2022   Procedure: BRONCHIAL NEEDLE ASPIRATION BIOPSIES;  Surgeon: Brenna Adine CROME, DO;  Location: [facility];  Service: Pulmonary;;   BRONCHIAL NEEDLE ASPIRATION BIOPSY  11/10/2022   Procedure: BRONCHIAL NEEDLE ASPIRATION BIOPSIES;  Surgeon: Brenna Adine CROME, DO;  Location: [facility];  Service: Pulmonary;;   COLONOSCOPY  2007   CYSTOSCOPY  03/25/2018   Procedure: CYSTOSCOPY;  Surgeon: Ceil Anes, MD;  Location: [facility];  Service: Urology;;  no seeds noted in bladder   FIDUCIAL MARKER PLACEMENT  07/21/2022   Procedure: FIDUCIAL MARKER PLACEMENT;  Surgeon: Brenna Adine CROME, DO;  Location: [facility];  Service: Pulmonary;;   LEG SURGERY     right leg deep cut   MOUTH SURGERY     PROSTATE BIOPSY     RADIOACTIVE SEED IMPLANT N/A 03/25/2018   Procedure: RADIOACTIVE SEED IMPLANT/BRACHYTHERAPY IMPLANT;  Surgeon: Ottelin, Mark, MD;  Location: [facility];  Service: Urology;  Laterality: N/A;  71 seeds implanted   SPACE OAR INSTILLATION N/A 03/25/2018   Procedure: SPACE OAR INSTILLATION;  Surgeon: Ottelin, Mark, MD;  Location: [facility];  Service: Urology;  Laterality: N/A;   STAPEDES SURGERY     bilateral   VIDEO ASSISTED THORACOSCOPY (VATS)/ LOBECTOMY Left 10/09/2014   Procedure: VIDEO ASSISTED THORACOSCOPY (VATS)/ LOBECTOMY;  Surgeon: Maude Fleeta Ochoa, MD;  Location: [facility];  Service: Thoracic;  Laterality: Left;    REVIEW OF SYSTEMS:  Constitutional: negative Eyes: negative Ears, nose, mouth, throat, and face: negative Respiratory: positive for dyspnea on exertion Cardiovascular: negative Gastrointestinal: negative Genitourinary:negative Integument/breast: negative Hematologic/lymphatic: negative Musculoskeletal:negative Neurological: negative Behavioral/Psych: negative Endocrine:  negative Allergic/Immunologic: negative   PHYSICAL EXAMINATION: General appearance: alert, cooperative, and no distress Head: Normocephalic, without obvious abnormality, atraumatic Neck: no adenopathy, no JVD, supple, symmetrical, trachea midline, and thyroid  not enlarged, symmetric, no tenderness/mass/nodules Lymph nodes: Cervical, supraclavicular, and axillary nodes normal. Resp: clear to auscultation bilaterally Back: symmetric, no curvature. ROM normal. No CVA tenderness. Cardio: regular rate and rhythm, S1, S2 normal, no murmur, click, rub or gallop GI: soft, non-tender; bowel sounds normal; no masses,  no organomegaly Extremities: extremities normal, atraumatic, no cyanosis or edema Neurologic: Alert and oriented X 3, normal strength and tone. Normal symmetric reflexes. Normal coordination and gait  ECOG PERFORMANCE STATUS: 1 - Symptomatic but completely ambulatory  Blood pressure 126/84, pulse 65, temperature 97.8 F (36.6 C), resp. rate 17, height 5' 5 (1.651 m), weight 114 lb 3.2 oz (51.8 kg), SpO2 95%.  LABORATORY DATA: Lab Results  Component Value Date   WBC 5.0 07/31/2024   HGB 15.3 07/31/2024   HCT 48.0 07/31/2024   MCV 92.7 07/31/2024   PLT 243 07/31/2024      Chemistry      Component Value Date/Time   NA 140 07/31/2024 1024   K 4.4 07/31/2024 1024   CL 101 07/31/2024 1024   CO2 35 (H) 07/31/2024  1024   BUN 10 07/31/2024 1024   CREATININE 0.63 07/31/2024 1024   CREATININE 0.71 11/24/2019 1327      Component Value Date/Time   CALCIUM 9.4 07/31/2024 1024   ALKPHOS 68 07/31/2024 1024   AST 23 07/31/2024 1024   ALT 18 07/31/2024 1024   BILITOT 0.7 07/31/2024 1024       RADIOGRAPHIC STUDIES: CT Chest W Contrast Result Date: 08/02/2024 CLINICAL DATA:  Non-small-cell lung cancer restaging * Tracking Code: BO * EXAM: CT CHEST WITH CONTRAST TECHNIQUE: Multidetector CT imaging of the chest was performed during intravenous contrast administration. RADIATION  DOSE REDUCTION: This exam was performed according to the departmental dose-optimization program which includes automated exposure control, adjustment of the mA and/or kV according to patient size and/or use of iterative reconstruction technique. CONTRAST:  75mL OMNIPAQUE  IOHEXOL  300 MG/ML  SOLN COMPARISON:  02/01/2024 FINDINGS: Cardiovascular: Aortic atherosclerosis. Normal heart size. Scattered left coronary artery calcifications. No pericardial effusion. Mediastinum/Nodes: No enlarged mediastinal, hilar, or axillary lymph nodes. Thyroid  gland, trachea, and esophagus demonstrate no significant findings. Lungs/Pleura: Mild centrilobular emphysema. Diffuse bilateral bronchial wall thickening and mild varicoid bronchiectasis in the lung bases. Suspect very slight interval enlargement of a spiculated, partially cavitary nodule in the superior lateral segment right middle lobe just distal to a marking clip, on today's examination measuring 2.7 x 2.0 cm, previously 2.5 x 1.8 cm (series 6, image 73). Wedge resection of the superior segment left lower lobe. No pleural effusion or pneumothorax. Upper Abdomen: No acute abnormality. Musculoskeletal: No chest wall abnormality. No acute osseous findings. IMPRESSION: 1. Suspect very slight interval enlargement of a spiculated, partially cavitary nodule in the superior lateral segment right middle lobe just distal to a marking clip, on today's examination measuring 2.7 x 2.0 cm, previously 2.5 x 1.8 cm. PET-CT may be helpful to assess for metabolically active disease in this vicinity. 2. No evidence of lymphadenopathy or metastatic disease in the chest. 3. Emphysema and diffuse bilateral bronchial wall thickening. 4. Coronary artery disease. Aortic Atherosclerosis (ICD10-I70.0) and Emphysema (ICD10-J43.9). Electronically Signed   By: Marolyn JONETTA Jaksch M.D.   On: 08/02/2024 16:30    ASSESSMENT AND PLAN: This is a very pleasant 68 years old white male with history of stage Ia (T1b,  N0, M0) non-small cell lung cancer, adenocarcinoma with positive EGFR mutation in exon 21 (L861Q) diagnosed in December 2015 status post left lower lobe wedge resection under the care of Dr. Obadiah. The patient also had recurrent disease presenting as stage Ia involving the right middle lobe status post SBRT in 2020 under the care of Dr. Patrcia. The patient is currently on observation and he is feeling fine with no concerning complaints. He had an CT scan of the chest performed recently.  I personally and independently reviewed the scan images and discussed the result with the patient today. His scan showed slight increase in the size of the right middle lobe lung nodule suspicious for disease progression. Assessment and Plan Assessment & Plan Recurrent right middle lobe nonsmall cell lung cancer, EGFR-mutated, post-radiation The right middle lobe nodule has increased in size from 2.5 x 1.8 cm to 2.7 x 2.0 cm, raising concern for active disease versus necrotic tissue. Previous PET scan in 2023 showed a smaller lesion. Given the EGFR mutation and prior radiation in 2020, further evaluation is necessary to determine current lesion activity. - Order PET scan to assess activity of the right middle lobe nodule - Consider additional radiation therapy if PET scan indicates active  disease - Schedule follow-up appointment in three weeks to discuss PET scan results  Exertional dyspnea managed with inhaler Exertional dyspnea requires inhaler use after walking. No chest pain or hemoptysis reported. Weight gain is positive. He continues breathing exercises as advised. - Continue current inhaler use as needed for exertional dyspnea He was advised to call immediately if he has any other concerning symptoms in the interval. The patient voices understanding of current disease status and treatment options and is in agreement with the current care plan.  All questions were answered. The patient knows to call the  clinic with any problems, questions or concerns. We can certainly see the patient much sooner if necessary.  The total time spent in the appointment was 30 minutes.  Disclaimer: This note was dictated with voice recognition software. Similar sounding words can inadvertently be transcribed and may not be corrected upon review.

## 2024-08-11 ENCOUNTER — Telehealth: Payer: Self-pay | Admitting: Internal Medicine

## 2024-08-11 NOTE — Telephone Encounter (Signed)
 Scheduled patient appointments. Called and left voicemail with appointment details.

## 2024-08-16 ENCOUNTER — Other Ambulatory Visit: Payer: Self-pay

## 2024-08-16 DIAGNOSIS — C342 Malignant neoplasm of middle lobe, bronchus or lung: Secondary | ICD-10-CM

## 2024-08-17 ENCOUNTER — Inpatient Hospital Stay

## 2024-08-17 ENCOUNTER — Encounter (HOSPITAL_COMMUNITY)
Admission: RE | Admit: 2024-08-17 | Discharge: 2024-08-17 | Disposition: A | Discharge status: Home / Self Care | Source: Ambulatory Visit | Attending: Internal Medicine | Admitting: Internal Medicine

## 2024-08-17 DIAGNOSIS — C342 Malignant neoplasm of middle lobe, bronchus or lung: Secondary | ICD-10-CM

## 2024-08-17 DIAGNOSIS — C3490 Malignant neoplasm of unspecified part of unspecified bronchus or lung: Secondary | ICD-10-CM | POA: Insufficient documentation

## 2024-08-17 DIAGNOSIS — R911 Solitary pulmonary nodule: Secondary | ICD-10-CM | POA: Diagnosis not present

## 2024-08-17 LAB — CMP (CANCER CENTER ONLY)
ALT: 16 U/L (ref 0–44)
AST: 18 U/L (ref 15–41)
Albumin: 4.1 g/dL (ref 3.5–5.0)
Alkaline Phosphatase: 66 U/L (ref 38–126)
Anion gap: 1 — ABNORMAL LOW (ref 5–15)
BUN: 11 mg/dL (ref 8–23)
CO2: 38 mmol/L — ABNORMAL HIGH (ref 22–32)
Calcium: 9.7 mg/dL (ref 8.9–10.3)
Chloride: 105 mmol/L (ref 98–111)
Creatinine: 0.66 mg/dL (ref 0.61–1.24)
GFR, Estimated: >60 mL/min (ref >60)
Glucose, Bld: 86 mg/dL (ref 70–99)
Potassium: 5 mmol/L (ref 3.5–5.1)
Sodium: 144 mmol/L (ref 135–145)
Total Bilirubin: 0.6 mg/dL (ref 0.0–1.2)
Total Protein: 6.7 g/dL (ref 6.5–8.1)

## 2024-08-17 LAB — CBC WITH DIFFERENTIAL (CANCER CENTER ONLY)
Abs Immature Granulocytes: 0.01 K/uL (ref 0.00–0.07)
Basophils Absolute: 0 K/uL (ref 0.0–0.1)
Basophils Relative: 1 %
Eosinophils Absolute: 0.2 K/uL (ref 0.0–0.5)
Eosinophils Relative: 3 %
HCT: 46.7 % (ref 39.0–52.0)
Hemoglobin: 14.9 g/dL (ref 13.0–17.0)
Immature Granulocytes: 0 %
Lymphocytes Relative: 35 %
Lymphs Abs: 1.6 K/uL (ref 0.7–4.0)
MCH: 29.7 pg (ref 26.0–34.0)
MCHC: 31.9 g/dL (ref 30.0–36.0)
MCV: 93.2 fL (ref 80.0–100.0)
Monocytes Absolute: 0.4 K/uL (ref 0.1–1.0)
Monocytes Relative: 9 %
Neutro Abs: 2.3 K/uL (ref 1.7–7.7)
Neutrophils Relative %: 52 %
Platelet Count: 232 K/uL (ref 150–400)
RBC: 5.01 MIL/uL (ref 4.22–5.81)
RDW: 13.1 % (ref 11.5–15.5)
WBC Count: 4.5 K/uL (ref 4.0–10.5)
nRBC: 0 % (ref 0.0–0.2)

## 2024-08-17 LAB — GLUCOSE, CAPILLARY: Glucose-Capillary: 91 mg/dL (ref 70–99)

## 2024-08-17 MED ORDER — FLUDEOXYGLUCOSE F - 18 (FDG) INJECTION
5.7600 | Freq: Once | INTRAVENOUS | Status: AC
  Administered 2024-08-17: 5.76 via INTRAVENOUS

## 2024-08-31 ENCOUNTER — Inpatient Hospital Stay: Admitting: Internal Medicine

## 2024-08-31 VITALS — BP 116/72 | HR 84 | Temp 98.0°F | Resp 17 | Ht 65.0 in | Wt 114.0 lb

## 2024-08-31 DIAGNOSIS — C3490 Malignant neoplasm of unspecified part of unspecified bronchus or lung: Secondary | ICD-10-CM | POA: Diagnosis not present

## 2024-08-31 DIAGNOSIS — C342 Malignant neoplasm of middle lobe, bronchus or lung: Secondary | ICD-10-CM | POA: Diagnosis not present

## 2024-08-31 NOTE — Progress Notes (Signed)
 [facility] [phone]   [phone]  OFFICE PROGRESS NOTE  Kyle Harvey, MD [address] [place] [address]  DIAGNOSIS:   1) stage IA (T1b, N0, M0) non-small cell lung cancer, adenocarcinoma with positive EGFR mutation in exon 21 (L861Q) diagnosed in December 2015. 2) Recurrent disease presenting as stage Ia involving the right middle lobe.  PRIOR THERAPY: 1)  status post left lower lobe wedge resection under the care of Dr. Obadiah. 2)  status post SBRT in 2020 under the care of Dr. Patrcia.   CURRENT THERAPY: Referral to radiation oncology for consideration of SBRT of the right lung nodule  INTERVAL HISTORY: Kyle Hamilton. 68 y.o. male returns to the clinic today for 61-month follow-up visit.Discussed the use of AI scribe software for clinical note transcription with the patient, who gave verbal consent to proceed.  History of Present Illness Kyle Hamilton. is a 68 year old male with stage one non-small cell lung cancer who presents for evaluation and discussion of his PET scan results.  He has a history of stage one non-small cell lung cancer, adenocarcinoma with a positive EGFR mutation in exon 21 (L861Q), diagnosed in December 2015. He also has a history of recurrent stage one A involving the right middle lobe. He underwent a left lower lobe wedge resection followed by SBRT to the right middle lobe lung nodule in 2020 and has been under observation since then.  A previous CT scan of the chest revealed an enlarging spiculated nodule along the minor fissure on the right side. A PET scan was ordered, which showed hypermetabolic activity in the nodule, similar to findings from August 2023. No new symptoms since his last visit. No chest pain or breathing difficulties. He mentions getting up early and moving around to 'get the air going'.    MEDICAL HISTORY: Past Medical History:  Diagnosis Date   Chronic mastoiditis, bilateral     COPD (chronic obstructive pulmonary disease) (HCC)    Hearing loss    Hypertension    Lung cancer (HCC)    Pneumothorax, left    History of post op   Prostate cancer (HCC)    Shortness of breath dyspnea    on exertion   Spinal stenosis     ALLERGIES:  has no known allergies.  MEDICATIONS:  Current Outpatient Medications  Medication Sig Dispense Refill   albuterol  (VENTOLIN  HFA) 108 (90 Base) MCG/ACT inhaler Inhale 2 puffs into the lungs every 6 (six) hours as needed for wheezing or shortness of breath. 8 g 6   budesonide -glycopyrrolate -formoterol  (BREZTRI  AEROSPHERE) 160-9-4.8 MCG/ACT AERO inhaler Inhale 2 puffs into the lungs in the morning and at bedtime. 10.7 g 11   budesonide -glycopyrrolate -formoterol  (BREZTRI  AEROSPHERE) 160-9-4.8 MCG/ACT AERO inhaler Inhale 2 puffs into the lungs in the morning and at bedtime.     hydrochlorothiazide  (MICROZIDE ) 12.5 MG capsule Take 12.5 mg by mouth daily.  1   rosuvastatin (CRESTOR) 10 MG tablet Take 10 mg by mouth daily.     Vitamin D, Ergocalciferol, (DRISDOL) 1.25 MG (50000 UNIT) CAPS capsule Take 50,000 Units by mouth once a week.     No current facility-administered medications for this visit.    SURGICAL HISTORY:  Past Surgical History:  Procedure Laterality Date   BAHA REVISION Right 08/30/2014   Procedure: BONE ANCHORED HEARING AID (BAHA) REVISION RIGHT SIDE;  Surgeon: Ida Loader, MD;  Location: [facility];  Service: ENT;  Laterality: Right;   BONE  ANCHORED HEARING AID IMPLANT  08/03/2012   Procedure: BONE ANCHORED HEARING AID (BAHA) IMPLANT;  Surgeon: Ida Loader, MD;  Location: [facility];  Service: ENT;  Laterality: Right;   BRONCHIAL BIOPSY  07/21/2022   Procedure: BRONCHIAL BIOPSIES;  Surgeon: Brenna Adine CROME, DO;  Location: [facility];  Service: Pulmonary;;   BRONCHIAL BIOPSY  11/10/2022   Procedure: BRONCHIAL BIOPSIES;  Surgeon: Brenna Adine CROME, DO;  Location: [facility];  Service: Pulmonary;;   BRONCHIAL BRUSHINGS  07/21/2022    Procedure: BRONCHIAL BRUSHINGS;  Surgeon: Brenna Adine CROME, DO;  Location: [facility];  Service: Pulmonary;;   BRONCHIAL BRUSHINGS  11/10/2022   Procedure: BRONCHIAL BRUSHINGS;  Surgeon: Brenna Adine CROME, DO;  [facility]: [facility];  Service: Pulmonary;;   BRONCHIAL NEEDLE ASPIRATION BIOPSY  07/21/2022   Procedure: BRONCHIAL NEEDLE ASPIRATION BIOPSIES;  Surgeon: Brenna Adine CROME, DO;  [facility]: [facility];  Service: Pulmonary;;   BRONCHIAL NEEDLE ASPIRATION BIOPSY  11/10/2022   Procedure: BRONCHIAL NEEDLE ASPIRATION BIOPSIES;  Surgeon: Brenna Adine CROME, DO;  Location: [facility];  Service: Pulmonary;;   COLONOSCOPY  2007   CYSTOSCOPY  03/25/2018   Procedure: CYSTOSCOPY;  Surgeon: Ceil Anes, MD;  Location: [facility];  Service: Urology;;  no seeds noted in bladder   FIDUCIAL MARKER PLACEMENT  07/21/2022   Procedure: FIDUCIAL MARKER PLACEMENT;  Surgeon: Brenna Adine CROME, DO;  Location: [facility];  Service: Pulmonary;;   LEG SURGERY     right leg deep cut   MOUTH SURGERY     PROSTATE BIOPSY     RADIOACTIVE SEED IMPLANT N/A 03/25/2018   Procedure: RADIOACTIVE SEED IMPLANT/BRACHYTHERAPY IMPLANT;  Surgeon: Ottelin, Mark, MD;  Location: [facility] [place] [facility];  Service: Urology;  Laterality: N/A;  71 seeds implanted   SPACE OAR INSTILLATION N/A 03/25/2018   Procedure: SPACE OAR INSTILLATION;  Surgeon: Ottelin, Mark, MD;  Location: [facility];  Service: Urology;  Laterality: N/A;   STAPEDES SURGERY     bilateral   VIDEO ASSISTED THORACOSCOPY (VATS)/ LOBECTOMY Left 10/09/2014   Procedure: VIDEO ASSISTED THORACOSCOPY (VATS)/ LOBECTOMY;  Surgeon: Maude Fleeta Ochoa, MD;  Location: [facility];  Service: Thoracic;  Laterality: Left;    REVIEW OF SYSTEMS:  Constitutional: positive for fatigue Eyes: negative Ears, nose, mouth, throat, and face: negative Respiratory: positive for dyspnea on exertion Cardiovascular: negative Gastrointestinal:  negative Genitourinary:negative Integument/breast: negative Hematologic/lymphatic: negative Musculoskeletal:negative Neurological: negative Behavioral/Psych: negative Endocrine: negative Allergic/Immunologic: negative   PHYSICAL EXAMINATION: General appearance: alert, cooperative, and no distress Head: Normocephalic, without obvious abnormality, atraumatic Neck: no adenopathy, no JVD, supple, symmetrical, trachea midline, and thyroid  not enlarged, symmetric, no tenderness/mass/nodules Lymph nodes: Cervical, supraclavicular, and axillary nodes normal. Resp: clear to auscultation bilaterally Back: symmetric, no curvature. ROM normal. No CVA tenderness. Cardio: regular rate and rhythm, S1, S2 normal, no murmur, click, rub or gallop GI: soft, non-tender; bowel sounds normal; no masses,  no organomegaly Extremities: extremities normal, atraumatic, no cyanosis or edema Neurologic: Alert and oriented X 3, normal strength and tone. Normal symmetric reflexes. Normal coordination and gait  ECOG PERFORMANCE STATUS: 1 - Symptomatic but completely ambulatory  Blood pressure 116/72, pulse 84, temperature 98 F (36.7 C), resp. rate 17, height 5' 5 (1.651 m), weight 114 lb (51.7 kg), SpO2 94%.  LABORATORY DATA: Lab Results  Component Value Date   WBC 4.5 08/17/2024   HGB 14.9 08/17/2024   HCT 46.7 08/17/2024   MCV 93.2 08/17/2024   PLT 232 08/17/2024      Chemistry  Component Value Date/Time   NA 144 08/17/2024 1121   K 5.0 08/17/2024 1121   CL 105 08/17/2024 1121   CO2 38 (H) 08/17/2024 1121   BUN 11 08/17/2024 1121   CREATININE 0.66 08/17/2024 1121   CREATININE 0.71 11/24/2019 1327      Component Value Date/Time   CALCIUM 9.7 08/17/2024 1121   ALKPHOS 66 08/17/2024 1121   AST 18 08/17/2024 1121   ALT 16 08/17/2024 1121   BILITOT 0.6 08/17/2024 1121       RADIOGRAPHIC STUDIES: NM PET Image Restage (PS) Skull Base to Thigh (F-18 FDG) Result Date: 08/19/2024 CLINICAL  DATA:  Subsequent treatment strategy for non-small cell lung cancer. EXAM: NUCLEAR MEDICINE PET SKULL BASE TO THIGH TECHNIQUE: 5.8 mCi F-18 FDG was injected intravenously. Full-ring PET imaging was performed from the skull base to thigh after the radiotracer. CT data was obtained and used for attenuation correction and anatomic localization. Fasting blood glucose: 91 mg/dl COMPARISON:  CT chest 90/77/7974 and PET 06/25/2022. FINDINGS: Mediastinal blood pool activity: SUV max 2.1 Liver activity: SUV max NA NECK: No abnormal hypermetabolism. Incidental CT findings: None. CHEST: Spiculated nodule along the minor fissure measures 1.9 x 2.6 cm (7/40), SUV max 1.7, stable from 07/31/2024 but enlarged from 06/25/2022, which time it measured 1.1 x 1.5 cm. It is similar in degree of FDG uptake, however. No additional abnormal hypermetabolism. Incidental CT findings: Atherosclerotic calcification of the aorta, aortic valve and coronary arteries. Heart size normal. No pericardial effusion. No pleural effusion. Centrilobular emphysema. Cylindrical bronchiectasis. Postsurgical and post treatment scarring in the perihilar left lung. ABDOMEN/PELVIS: No abnormal hypermetabolism. Incidental CT findings: Small hypoattenuating lesions in the liver, too small to characterize. Small bilateral renal stones. Low-attenuation lesion in the right kidney. No specific follow-up necessary. Marked bladder wall thickening. Radiotherapy seeds in the prostate. SKELETON: No abnormal hypermetabolism. Incidental CT findings: Degenerative changes in the spine. IMPRESSION: 1. Spiculated nodule along the minor fissure has enlarged from 06/25/2022 but is hypometabolic, as on 06/25/2022. Recommend continued attention on follow-up oncologic imaging as indolent adenocarcinoma cannot be excluded. 2. Cylindrical bronchiectasis. 3. Bilateral renal stones. 4. Marked bladder wall thickening. 5. Aortic atherosclerosis (ICD10-I70.0). Coronary artery calcification.  6.  Emphysema (ICD10-J43.9). Electronically Signed   By: Newell Eke M.D.   On: 08/19/2024 11:30    ASSESSMENT AND PLAN: This is a very pleasant 68 years old white male with history of stage Ia (T1b, N0, M0) non-small cell lung cancer, adenocarcinoma with positive EGFR mutation in exon 21 (L861Q) diagnosed in December 2015 status post left lower lobe wedge resection under the care of Dr. Obadiah. The patient also had recurrent disease presenting as stage Ia involving the right middle lobe status post SBRT in 2020 under the care of Dr. Patrcia. The patient is currently on observation and he is feeling fine with no concerning complaints. He had an CT scan of the chest performed recently.  His scan showed slight increase in the size of the right middle lobe lung nodule suspicious for disease progression.  He had a PET scan performed on August 17, 2024 that showed the spiculated nodule along the minor fissure had a larger and hypermetabolic as on June 25, 2022 and continuous observation and monitoring was recommended. I personally independently reviewed the imaging studies with the patient and recommended for him to see radiation oncology for consideration of SBRT to the enlarging nodule. Assessment and Plan Assessment & Plan Recurrent right middle lobe lung adenocarcinoma with EGFR mutation Recurrent adenocarcinoma  in the right middle lobe with EGFR mutation, previously treated with SBRT. Current PET scan shows hypermetabolic activity in the nodule along the minor fissure on the right side, similar to August 2023. No new areas of concern identified. - Refer to Dr. Patrcia, radiation oncologist, for evaluation and potential radiation therapy to the affected area. - Schedule follow-up appointment in six months with a repeat chest scan. He was advised to call immediately if he has any other concerning symptoms in the interval.  The patient voices understanding of current disease status and treatment  options and is in agreement with the current care plan.  All questions were answered. The patient knows to call the clinic with any problems, questions or concerns. We can certainly see the patient much sooner if necessary.  The total time spent in the appointment was 30 minutes.  Disclaimer: This note was dictated with voice recognition software. Similar sounding words can inadvertently be transcribed and may not be corrected upon review.

## 2024-09-01 ENCOUNTER — Telehealth: Payer: Self-pay | Admitting: Internal Medicine

## 2024-09-01 NOTE — Telephone Encounter (Signed)
 Scheduled appointments with the patient per LOS notes. The patient is aware of all appointment details and will be mailed a reminder.

## 2024-10-20 ENCOUNTER — Telehealth: Payer: Self-pay

## 2024-10-20 NOTE — Telephone Encounter (Signed)
 Copied from CRM #[id number]. Topic: Clinical - Prescription Issue >> Oct 19, 2024 10:21 AM Russell PARAS wrote: Reason for CRM:   Pt is contacting clinic regarding his prescribed Breztri . His insurance is changing at first of next year and this inhaler will not be covered by his new insurance.   He was advised to have provider to contact Annabella Schroeder at [facility] to determine which alternative inhalers they will cover  CB#  [phone](Tiffany M.)  Pt also requested call back with status update  CB#  [phone]   Please advise formulary alternative for Breztri 

## 2024-10-23 NOTE — Telephone Encounter (Signed)
 Miller, Juliana M, CPhT to [facility]     10/23/24  9:33 AM Unable to do benefits investigations for the new year (can only do once new plan year is active) patient may contact plan to see what may be covered.   Spoke with the pt and notified of response from pharmacy  Nothing further needed

## 2024-10-26 ENCOUNTER — Other Ambulatory Visit: Payer: Self-pay

## 2024-10-26 DIAGNOSIS — J449 Chronic obstructive pulmonary disease, unspecified: Secondary | ICD-10-CM

## 2024-10-26 MED ORDER — ALBUTEROL SULFATE HFA 108 (90 BASE) MCG/ACT IN AERS: 2.0000 | INHALATION_SPRAY | Freq: Four times a day (QID) | RESPIRATORY_TRACT | 6 refills | Status: AC | PRN

## 2025-02-20 ENCOUNTER — Inpatient Hospital Stay

## 2025-02-27 ENCOUNTER — Inpatient Hospital Stay: Admitting: Internal Medicine
